# Patient Record
Sex: Female | Born: 1975 | Race: White | Hispanic: No | Smoking: Current every day smoker
Health system: Southern US, Community
[De-identification: ages and names within clinical notes are randomized; demographics above are authoritative.]

## PROBLEM LIST (undated history)

## (undated) ENCOUNTER — Inpatient Hospital Stay: Payer: Self-pay

## (undated) DIAGNOSIS — E119 Type 2 diabetes mellitus without complications: Secondary | ICD-10-CM

## (undated) DIAGNOSIS — E669 Obesity, unspecified: Secondary | ICD-10-CM

## (undated) DIAGNOSIS — I10 Essential (primary) hypertension: Secondary | ICD-10-CM

## (undated) HISTORY — PX: TOE AMPUTATION: SHX809

---

## 2005-09-10 ENCOUNTER — Emergency Department: Payer: Self-pay | Admitting: Emergency Medicine

## 2006-07-06 ENCOUNTER — Emergency Department: Payer: Self-pay | Admitting: Unknown Physician Specialty

## 2008-01-20 ENCOUNTER — Emergency Department (HOSPITAL_COMMUNITY): Admission: EM | Admit: 2008-01-20 | Discharge: 2008-01-20 | Payer: Self-pay | Admitting: Emergency Medicine

## 2008-04-25 ENCOUNTER — Emergency Department: Payer: Self-pay | Admitting: Emergency Medicine

## 2009-05-07 ENCOUNTER — Emergency Department: Payer: Self-pay | Admitting: Emergency Medicine

## 2011-02-15 ENCOUNTER — Emergency Department: Payer: Self-pay | Admitting: Emergency Medicine

## 2011-03-21 LAB — URINALYSIS, ROUTINE W REFLEX MICROSCOPIC
Glucose, UA: >1000 — AB
Ketones, ur: 15 — AB
Leukocytes, UA: NEGATIVE
Nitrite: NEGATIVE
Protein, ur: NEGATIVE
Urobilinogen, UA: 0.2

## 2011-03-21 LAB — DIFFERENTIAL
Eosinophils Absolute: 0.5
Eosinophils Relative: 4
Lymphocytes Relative: 33
Lymphs Abs: 3.6
Monocytes Absolute: 0.7
Monocytes Relative: 6

## 2011-03-21 LAB — CBC
MCHC: 34.2
MCV: 96.1
Platelets: 289
RBC: 4.29
WBC: 11 — ABNORMAL HIGH

## 2011-03-21 LAB — WET PREP, GENITAL
Trich, Wet Prep: NONE SEEN
Yeast Wet Prep HPF POC: NONE SEEN

## 2011-03-21 LAB — COMPREHENSIVE METABOLIC PANEL
ALT: 61 — ABNORMAL HIGH
AST: 32
Albumin: 3.8
Calcium: 9.1
Creatinine, Ser: 0.7
GFR calc Af Amer: >60
Sodium: 136

## 2011-03-21 LAB — URINE MICROSCOPIC-ADD ON

## 2012-10-31 ENCOUNTER — Emergency Department: Payer: Self-pay | Admitting: Emergency Medicine

## 2012-10-31 LAB — URINALYSIS, COMPLETE
Bacteria: NONE SEEN
Blood: NEGATIVE
Ketone: NEGATIVE
Leukocyte Esterase: NEGATIVE
Ph: 5 (ref 4.5–8.0)
Protein: NEGATIVE
Specific Gravity: 1.032 (ref 1.003–1.030)
WBC UR: <1 /HPF (ref 0–5)

## 2012-10-31 LAB — COMPREHENSIVE METABOLIC PANEL
Alkaline Phosphatase: 89 U/L (ref 50–136)
Creatinine: 0.51 mg/dL — ABNORMAL LOW (ref 0.60–1.30)
EGFR (African American): >60
Osmolality: 280 (ref 275–301)
Potassium: 3.8 mmol/L (ref 3.5–5.1)
SGOT(AST): 35 U/L (ref 15–37)
SGPT (ALT): 62 U/L (ref 12–78)
Sodium: 132 mmol/L — ABNORMAL LOW (ref 136–145)

## 2012-10-31 LAB — CBC
HGB: 15.6 g/dL (ref 12.0–16.0)
MCHC: 35.3 g/dL (ref 32.0–36.0)
Platelet: 304 10*3/uL (ref 150–440)
RDW: 13.6 % (ref 11.5–14.5)

## 2012-10-31 LAB — PREGNANCY, URINE: Pregnancy Test, Urine: NEGATIVE m[IU]/mL

## 2012-11-02 ENCOUNTER — Emergency Department: Payer: Self-pay | Admitting: Emergency Medicine

## 2012-11-06 LAB — CULTURE, BLOOD (SINGLE)

## 2013-03-16 ENCOUNTER — Emergency Department: Payer: Self-pay | Admitting: Emergency Medicine

## 2014-11-30 ENCOUNTER — Ambulatory Visit
Admission: RE | Admit: 2014-11-30 | Discharge: 2014-11-30 | Disposition: A | Discharge status: Home / Self Care | Payer: Medicaid Other | Source: Ambulatory Visit | Attending: Surgery | Admitting: Surgery

## 2014-11-30 ENCOUNTER — Encounter: Payer: Medicaid Other | Attending: Surgery | Admitting: Surgery

## 2014-11-30 ENCOUNTER — Other Ambulatory Visit: Payer: Self-pay | Admitting: Surgery

## 2014-11-30 DIAGNOSIS — L97522 Non-pressure chronic ulcer of other part of left foot with fat layer exposed: Secondary | ICD-10-CM | POA: Insufficient documentation

## 2014-11-30 DIAGNOSIS — T148 Other injury of unspecified body region: Secondary | ICD-10-CM | POA: Diagnosis not present

## 2014-11-30 DIAGNOSIS — I10 Essential (primary) hypertension: Secondary | ICD-10-CM | POA: Insufficient documentation

## 2014-11-30 DIAGNOSIS — E1140 Type 2 diabetes mellitus with diabetic neuropathy, unspecified: Secondary | ICD-10-CM | POA: Diagnosis not present

## 2014-11-30 DIAGNOSIS — E11621 Type 2 diabetes mellitus with foot ulcer: Secondary | ICD-10-CM | POA: Insufficient documentation

## 2014-11-30 DIAGNOSIS — T798XXA Other early complications of trauma, initial encounter: Secondary | ICD-10-CM

## 2014-11-30 DIAGNOSIS — F17210 Nicotine dependence, cigarettes, uncomplicated: Secondary | ICD-10-CM | POA: Diagnosis not present

## 2014-11-30 DIAGNOSIS — X58XXXA Exposure to other specified factors, initial encounter: Secondary | ICD-10-CM | POA: Diagnosis not present

## 2014-12-01 ENCOUNTER — Other Ambulatory Visit
Admission: RE | Admit: 2014-12-01 | Discharge: 2014-12-01 | Disposition: A | Discharge status: Home / Self Care | Payer: Medicaid Other | Source: Ambulatory Visit | Attending: Internal Medicine | Admitting: Internal Medicine

## 2014-12-01 ENCOUNTER — Ambulatory Visit: Payer: Self-pay | Admitting: Surgery

## 2014-12-01 DIAGNOSIS — L089 Local infection of the skin and subcutaneous tissue, unspecified: Secondary | ICD-10-CM | POA: Diagnosis present

## 2014-12-01 DIAGNOSIS — S91302A Unspecified open wound, left foot, initial encounter: Secondary | ICD-10-CM | POA: Insufficient documentation

## 2014-12-02 NOTE — Progress Notes (Addendum)
[name] ([id number]) Visit Report for 11/30/2014 Allergy List Details Patient Name: [name], [name] Date of Service: 11/30/2014 2:30 PM Medical Record Number: [id number] Patient Account Number: [id number] Date of Birth/Sex: 04-01-1976 (39 y.o. Female) Treating RN: Huel Coventry Primary Care Physician: [name] Other Clinician: Referring Physician: Treating Physician/Extender: [name] in Treatment: 0 Allergies Active Allergies No Known Allergies Allergy Notes Electronic Signature(s) Signed: 12/01/2014 4:53:24 PM By: Elliot Gurney, RN, BSN, Kim RN, BSN Entered By: Elliot Gurney, RN, BSN, Kim on 11/30/2014 14:30:42 Balderson, Rosezella [name] ([id number]) -------------------------------------------------------------------------------- Arrival Information Details Patient Name: [name] Date of Service: 11/30/2014 2:30 PM Medical Record Number: [id number] Patient Account Number: [id number] Date of Birth/Sex: [birth year] (39 y.o. Female) Treating RN: Huel Coventry Primary Care Physician: [name] Other Clinician: Referring Physician: Treating Physician/Extender: [name] in Treatment: 0 Visit Information Patient Arrived: Ambulatory Arrival Time: 14:24 Accompanied By: self Transfer Assistance: None Patient Identification Verified: Yes Secondary Verification Process Yes Completed: Patient Has Alerts: Yes Patient Alerts: Type II Diabetic 11/30/2014 ABI: (L) 1.24 (R) 0.92 Electronic Signature(s) Signed: 12/04/2014 10:03:05 AM By: Elliot Gurney, RN, BSN, Kim RN, BSN Previous Signature: 12/01/2014 4:53:24 PM Version By: Elliot Gurney, RN, BSN, Kim RN, BSN Entered By: Elliot Gurney, RN, BSN, Kim on 12/04/2014 10:03:05 Bordwell, Rosezella [name] ([id number]) -------------------------------------------------------------------------------- Clinic Level of Care Assessment Details Patient Name: [name], [name] Date of Service: 11/30/2014 2:30 PM Medical Record Number: [id number] Patient  Account Number: [id number] Date of Birth/Sex: December 26, 1975 (39 y.o. Female) Treating RN: Curtis Sites Primary Care Physician: [name] Other Clinician: Referring Physician: Treating Physician/Extender: [name] in Treatment: 0 Clinic Level of Care Assessment Items TOOL 2 Quantity Score []  - Use when only an EandM is performed on the INITIAL visit 0 ASSESSMENTS - Nursing Assessment / Reassessment X - General Physical Exam (combine w/ comprehensive assessment (listed just 1 20 below) when performed on new pt. evals) X - Comprehensive Assessment (HX, ROS, Risk Assessments, Wounds Hx, etc.) 1 25 ASSESSMENTS - Wound and Skin Assessment / Reassessment X - Simple Wound Assessment / Reassessment - one wound 1 5 []  - Complex Wound Assessment / Reassessment - multiple wounds 0 []  - Dermatologic / Skin Assessment (not related to wound area) 0 ASSESSMENTS - Ostomy and/or Continence Assessment and Care []  - Incontinence Assessment and Management 0 []  - Ostomy Care Assessment and Management (repouching, etc.) 0 PROCESS - Coordination of Care X - Simple Patient / Family Education for ongoing care 1 15 []  - Complex (extensive) Patient / Family Education for ongoing care 0 X - Staff obtains Chiropractor, Records, Test Results / Process Orders 1 10 []  - Staff telephones HHA, Nursing Homes / Clarify orders / etc 0 []  - Routine Transfer to another Facility (non-emergent condition) 0 []  - Routine Hospital Admission (non-emergent condition) 0 X - New Admissions / Manufacturing engineer / Ordering NPWT, Apligraf, etc. 1 15 []  - Emergency Hospital Admission (emergent condition) 0 X - Simple Discharge Coordination 1 10 [name], [name] ([id number]) []  - Complex (extensive) Discharge Coordination 0 PROCESS - Special Needs []  - Pediatric / Minor Patient Management 0 []  - Isolation Patient Management 0 []  - Hearing / Language / Visual special needs 0 []  - Assessment of Community assistance  (transportation, D/C planning, etc.) 0 []  - Additional assistance / Altered mentation 0 []  - Support Surface(s) Assessment (bed, cushion, seat, etc.) 0 INTERVENTIONS - Wound Cleansing / Measurement X - Wound Imaging (photographs - any number of wounds) 1 5 []  - Wound Tracing (instead  of photographs) 0 X - Simple Wound Measurement - one wound 1 5  - Complex Wound Measurement - multiple wounds 0 X - Simple Wound Cleansing - one wound 1 5  - Complex Wound Cleansing - multiple wounds 0 INTERVENTIONS - Wound Dressings X - Small Wound Dressing one or multiple wounds 1 10  - Medium Wound Dressing one or multiple wounds 0  - Large Wound Dressing one or multiple wounds 0  - Application of Medications - injection 0 INTERVENTIONS - Miscellaneous  - External ear exam 0  - Specimen Collection (cultures, biopsies, blood, body fluids, etc.) 0  - Specimen(s) / Culture(s) sent or taken to Lab for analysis 0  - Patient Transfer (multiple staff / Michiel Sites Lift / Similar devices) 0  - Simple Staple / Suture removal (25 or less) 0  - Complex Staple / Suture removal (26 or more) 0 [facility], Laurey M. ([id number])  - Hypo / Hyperglycemic Management (close monitor of Blood Glucose) 0 X - Ankle / Brachial Index (ABI) - do not check if billed separately 1 15 Has the patient been seen at the hospital within the last three years: Yes Total Score: 140 Level Of Care: New/Established - Level 4 Electronic Signature(s) Signed: 11/30/2014 5:03:06 PM By: Curtis Sites Entered By: Curtis Sites on 11/30/2014 15:25:59 Kealey, Rosezella [name] ([id number]) -------------------------------------------------------------------------------- Encounter Discharge Information Details Patient Name: [name], [name] Date of Service: 11/30/2014 2:30 PM Medical Record Number: [id number] Patient Account Number: [id number] Date of Birth/Sex: [birth year] (39 y.o. Female) Treating RN: Primary Care Physician: [name] Other Clinician: Referring Physician: Treating Physician/Extender: [name] in Treatment: 0 Encounter Discharge Information Items Discharge Pain Level: 0 Discharge Condition: Stable Ambulatory Status: Ambulatory Discharge Destination: Home Transportation: Private Auto Accompanied By: self Schedule Follow-up Appointment: Yes Medication Reconciliation completed and provided to Patient/Care Yes Huxley Vanwagoner: Provided on Clinical Summary of Care: 11/30/2014 Form Type Recipient Paper Patient LS Electronic Signature(s) Signed: 12/01/2014 4:53:24 PM By: Elliot Gurney RN, BSN, Kim RN, BSN Previous Signature: 11/30/2014 3:43:38 PM Version By: Gwenlyn Perking Entered By: Elliot Gurney RN, BSN, Kim on 11/30/2014 15:48:35 Rosamilia, Rosezella [name] ([id number]) -------------------------------------------------------------------------------- Lower Extremity Assessment Details Patient Name: Rueckert, Vergie M. Date of Service: 11/30/2014 2:30 PM Medical Record Number: [id number] Patient Account Number: [id number] Date of Birth/Sex: 03/28/1976 (39 y.o. Female) Treating RN: Huel Coventry Primary Care Physician: [name] Other Clinician: Referring Physician: Treating Physician/Extender: [name] in Treatment: 0 Edema Assessment Assessed: [Left: No] [Right: No] E[Left: dema] [Right: :] Calf Left: Right: Point of Measurement: 34 cm From Medial Instep 44.3 cm 45.2 cm Ankle Left: Right: Point of Measurement: 10 cm From Medial Instep 25.7 cm 25.3 cm Vascular Assessment Claudication: Claudication Assessment [Left:Intermittent] [Right:None] Pulses: Posterior Tibial Palpable: [Left:Yes] [Right:Yes] Doppler: [Left:Multiphasic] Dorsalis Pedis Palpable: [Left:Yes] [Right:Yes] Doppler: [Left:Multiphasic] [Right:Multiphasic] Extremity colors, hair growth, and conditions: Extremity Color: [Left:Normal] [Right:Normal] Hair Growth on Extremity: [Left:Yes] [Right:Yes] Temperature of Extremity:  [Left:Warm] [Right:Warm] Capillary Refill: [Left:< 3 seconds] [Right:< 3 seconds] Blood Pressure: Brachial: [Left:104] [Right:118] Dorsalis Pedis: 146 [Left:Dorsalis Pedis: 86] Ankle: Posterior Tibial: 138 [Left:Posterior Tibial: 108 1.24] [Right:0.92] Toe Nail Assessment Left: Right: Thick: Yes Yes Discolored: No Mifflin, Rosezella [name] ([id number]) Deformed: No No Improper Length and Hygiene: No No Electronic Signature(s) Signed: 12/01/2014 4:53:24 PM By: Elliot Gurney, RN, BSN, Kim RN, BSN Entered By: Elliot Gurney, RN, BSN, Kim on 11/30/2014 14:55:10 Hartzell, Rosezella [name] ([id number]) -------------------------------------------------------------------------------- Multi Wound Chart Details Patient Name: Huskins, Latiya M. Date of Service: 11/30/2014 2:30 PM Medical Record Number: [id number] Patient Account  Number: [id number] Date of Birth/Sex: 1976-01-04 (38 y.o. Female) Treating RN: Curtis Sites Primary Care Physician: [name] Other Clinician: Referring Physician: Treating Physician/Extender: [name] in Treatment: 0 Vital Signs Height(in): 67 Pulse(bpm): 84 Weight(lbs): 290 Blood Pressure 125/80 (mmHg): Body Mass Index(BMI): 45 Temperature(F): 97.7 Respiratory Rate 18 (breaths/min): Photos: [N/A:N/A] Wound Location: Left Foot - Plantar, Lateral N/A N/A Wounding Event: Gradually Appeared N/A N/A Primary Etiology: Diabetic Wound/Ulcer of N/A N/A the Lower Extremity Comorbid History: Type II Diabetes N/A N/A Date Acquired: 11/21/2013 N/A N/A Weeks of Treatment: 0 N/A N/A Wound Status: Open N/A N/A Measurements L x W x D 2.2x1.3x0.9 N/A N/A (cm) Area (cm) : 2.246 N/A N/A Volume (cm) : 2.022 N/A N/A % Reduction in Area: 0.00% N/A N/A % Reduction in Volume: 0.00% N/A N/A Classification: Grade 2 N/A N/A Exudate Amount: Large N/A N/A Exudate Type: Purulent N/A N/A Exudate Color: yellow, brown, green N/A N/A Foul Odor After Yes N/A N/A Cleansing: Odor  Anticipated Due to No N/A N/A Product Use: Wound Margin: Thickened N/A N/A Boden, Vonnetta M. ([id number]) Granulation Amount: Medium (34-66%) N/A N/A Necrotic Amount: Small (1-33%) N/A N/A Necrotic Tissue: Eschar N/A N/A Exposed Structures: Fascia: No N/A N/A Fat: No Tendon: No Muscle: No Joint: No Bone: No Limited to Skin Breakdown Epithelialization: None N/A N/A Periwound Skin Texture: Edema: No N/A N/A Excoriation: No Induration: No Callus: No Crepitus: No Fluctuance: No Friable: No Rash: No Scarring: No Periwound Skin Maceration: Yes N/A N/A Moisture: Moist: Yes Dry/Scaly: No Periwound Skin Color: Atrophie Blanche: No N/A N/A Cyanosis: No Ecchymosis: No Erythema: No Hemosiderin Staining: No Mottled: No Pallor: No Rubor: No Tenderness on No N/A N/A Palpation: Wound Preparation: Ulcer Cleansing: N/A N/A Rinsed/Irrigated with Saline Topical Anesthetic Applied: Other: lidocaine 4% Treatment Notes Electronic Signature(s) Signed: 11/30/2014 5:03:06 PM By: Curtis Sites Entered By: Curtis Sites on 11/30/2014 15:19:11 Dials, Rosezella [name] ([id number]) -------------------------------------------------------------------------------- Multi-Disciplinary Care Plan Details Patient Name: Leopard, Nikcole M. Date of Service: 11/30/2014 2:30 PM Medical Record Number: [id number] Patient Account Number: [id number] Date of Birth/Sex: 1975-12-27 (39 y.o. Female) Treating RN: Curtis Sites Primary Care Physician: [name] Other Clinician: Referring Physician: Treating Physician/Extender: [name] in Treatment: 0 Active Inactive Nutrition Nursing Diagnoses: Potential for alteratiion in Nutrition/Potential for imbalanced nutrition Goals: Patient/caregiver agrees to and verbalizes understanding of need to use nutritional supplements and/or vitamins as prescribed Date Initiated: 11/30/2014 Goal Status: Active Interventions: Assess patient nutrition  upon admission and as needed per policy Notes: Orientation to the Wound Care Program Nursing Diagnoses: Knowledge deficit related to the wound healing center program Goals: Patient/caregiver will verbalize understanding of the Wound Healing Center Program Date Initiated: 11/30/2014 Goal Status: Active Interventions: Provide education on orientation to the wound center Notes: Peripheral Neuropathy Nursing Diagnoses: Potential alteration in peripheral tissue perfusion (select prior to confirmation of diagnosis) Goals: Patient/caregiver will verbalize understanding of disease process and disease management Mayall, YASLYN CUMBY. ([id number]) Date Initiated: 11/30/2014 Goal Status: Active Interventions: Assess signs and symptoms of neuropathy upon admission and as needed Notes: Wound/Skin Impairment Nursing Diagnoses: Impaired tissue integrity Knowledge deficit related to smoking impact on wound healing Goals: Ulcer/skin breakdown will have a volume reduction of 30% by week 4 Date Initiated: 11/30/2014 Goal Status: Active Interventions: Assess ulceration(s) every visit Notes: Electronic Signature(s) Signed: 11/30/2014 5:03:06 PM By: Curtis Sites Entered By: Curtis Sites on 11/30/2014 15:18:35 Kiedrowski, Rosezella [name] ([id number]) -------------------------------------------------------------------------------- Pain Assessment Details Patient Name: Liaw, Lyndee M. Date of Service: 11/30/2014 2:30 PM Medical Record  Number: [id number] Patient Account Number: [id number] Date of Birth/Sex: 23-Sep-1975 (38 y.o. Female) Treating RN: Huel Coventry Primary Care Physician: [name] Other Clinician: Referring Physician: Treating Physician/Extender: [name] in Treatment: 0 Active Problems Location of Pain Severity and Description of Pain Patient Has Paino Yes Site Locations Pain Location: Pain in Ulcers With Dressing Change: No Duration of the Pain. Constant /  Intermittento Intermittent Character of Pain Describe the Pain: Shooting Pain Management and Medication Current Pain Management: Notes Elevation and gabapentin helps pain Electronic Signature(s) Signed: 12/01/2014 4:53:24 PM By: Elliot Gurney, RN, BSN, Kim RN, BSN Entered By: Elliot Gurney, RN, BSN, Kim on 11/30/2014 14:29:39 Nees, Rosezella [name] ([id number]) -------------------------------------------------------------------------------- Patient/Caregiver Education Details Patient Name: [name]. Date of Service: 11/30/2014 2:30 PM Medical Record Number: [id number] Patient Account Number: [id number] Date of Birth/Gender: 04-07-1976 (39 y.o. Female) Treating RN: Curtis Sites Primary Care Physician: [name] Other Clinician: Referring Physician: Treating Physician/Extender: [name] in Treatment: 0 Education Assessment Education Provided To: Patient Education Topics Provided Wound/Skin Impairment: Handouts: Other: wound care as ordered Methods: Demonstration, Explain/Verbal Responses: State content correctly Electronic Signature(s) Signed: 11/30/2014 5:03:06 PM By: Curtis Sites Entered By: Curtis Sites on 11/30/2014 15:24:17 Gupta, Rosezella [name] ([id number]) -------------------------------------------------------------------------------- Wound Assessment Details Patient Name: Mcquary, Keashia M. Date of Service: 11/30/2014 2:30 PM Medical Record Number: [id number] Patient Account Number: [id number] Date of Birth/Sex: October 14, 1975 (39 y.o. Female) Treating RN: Huel Coventry Primary Care Physician: [name] Other Clinician: Referring Physician: Treating Physician/Extender: [name] in Treatment: 0 Wound Status Wound Number: 1 Primary Diabetic Wound/Ulcer of the Lower Etiology: Extremity Wound Location: Left Foot - Plantar, Lateral Wound Status: Open Wounding Event: Gradually Appeared Comorbid Type II Diabetes Date Acquired:  11/21/2013 History: Weeks Of Treatment: 0 Clustered Wound: No Photos Wound Measurements Length: (cm) 2.2 Width: (cm) 1.3 Depth: (cm) 0.9 Area: (cm) 2.246 Volume: (cm) 2.022 % Reduction in Area: 0% % Reduction in Volume: 0% Epithelialization: None Tunneling: No Wound Description Classification: Grade 3 Wagner Verification: Abscess Wound Margin: Thickened Exudate Amount: Large Exudate Type: Purulent Exudate Color: yellow, brown, green Foul Odor After Cleansing: Yes Due to Product Use: No Wound Bed Granulation Amount: Medium (34-66%) Exposed Structure Necrotic Amount: Small (1-33%) Fascia Exposed: No Necrotic Quality: Eschar Fat Layer Exposed: No Tendon Exposed: No Furr, Cheray M. ([id number]) Muscle Exposed: No Joint Exposed: No Bone Exposed: No Limited to Skin Breakdown Periwound Skin Texture Texture Color No Abnormalities Noted: No No Abnormalities Noted: No Callus: No Atrophie Blanche: No Crepitus: No Cyanosis: No Excoriation: No Ecchymosis: No Fluctuance: No Erythema: No Friable: No Hemosiderin Staining: No Induration: No Mottled: No Localized Edema: No Pallor: No Rash: No Rubor: No Scarring: No Moisture No Abnormalities Noted: No Dry / Scaly: No Maceration: Yes Moist: Yes Wound Preparation Ulcer Cleansing: Rinsed/Irrigated with Saline Topical Anesthetic Applied: Other: lidocaine 4%, Treatment Notes Wound #1 (Left, Lateral, Plantar Foot) 1. Cleansed with: Clean wound with Normal Saline 2. Anesthetic Topical Lidocaine 4% cream to wound bed prior to debridement 4. Dressing Applied: Aquacel Ag 5. Secondary Dressing Applied Gauze and Kerlix/Conform Notes Felt around wound for off-loading. Electronic Signature(s) Signed: 12/04/2014 10:02:16 AM By: Elliot Gurney, RN, BSN, Kim RN, BSN Previous Signature: 12/01/2014 4:53:24 PM Version By: Elliot Gurney, RN, BSN, Kim RN, BSN Entered By: Elliot Gurney, RN, BSN, Kim on 12/04/2014 10:02:16 Mazur, Rosezella [name]  ([id number]) -------------------------------------------------------------------------------- Vitals Details Patient Name: Woodhams, AIDEE LATIMORE. Date of Service: 11/30/2014 2:30 PM Medical Record Number: [id number] Patient Account Number: [id number] Date of Birth/Sex: 29-Jul-1975 (39 y.o. Female)  Treating RN: Huel Coventry Primary Care Physician: [name] Other Clinician: Referring Physician: Treating Physician/Extender: [name] in Treatment: 0 Vital Signs Time Taken: 14:29 Temperature (F): 97.7 Height (in): 67 Pulse (bpm): 84 Source: Stated Respiratory Rate (breaths/min): 18 Weight (lbs): 290 Blood Pressure (mmHg): 125/80 Source: Measured Reference Range: 80 - 120 mg / dl Body Mass Index (BMI): 45.4 Electronic Signature(s) Signed: 12/01/2014 4:53:24 PM By: Elliot Gurney, RN, BSN, Kim RN, BSN Entered By: Elliot Gurney, RN, BSN, Kim on 11/30/2014 14:30:30

## 2014-12-02 NOTE — Progress Notes (Addendum)
[name] ([id number]) Visit Report for 11/30/2014 Chief Complaint Document Details Patient Name: [name], [name] Date of Service: 11/30/2014 2:30 PM Medical Record Number: [id number] Patient Account Number: [id number] Date of Birth/Sex: 06-Aug-1975 (39 y.o. Female) Treating RN: Primary Care Physician: [name] Other Clinician: Referring Physician: Treating Physician/Extender: [name] in Treatment: 0 Information Obtained from: Patient Chief Complaint Patients presents for treatment of an open diabetic ulcer. The patient has had an ulcerated wound on her left lower extremity for about 2 years. Electronic Signature(s) Signed: 11/30/2014 4:41:50 PM By: [name] MD, FACS Entered By: [name] on 11/30/2014 15:39:31 [name] -------------------------------------------------------------------------------- Debridement Details Patient Name: [name], [name] Date of Service: 11/30/2014 2:30 PM Medical Record Number: [id number] Patient Account Number: [id number] Date of Birth/Sex: [birth year] (39 y.o. Female) Treating RN: Primary Care Physician: [name] Other Clinician: Referring Physician: Treating Physician/Extender: [name] in Treatment: 0 Debridement Performed for Wound #1 Left,Lateral,Plantar Foot Assessment: Performed By: Physician Tristan Schroeder., MD Debridement: Debridement Pre-procedure Yes Verification/Time Out Taken: Start Time: 03:20 Pain Control: Lidocaine 4% Topical Solution Level: Skin/Subcutaneous Tissue/Muscle Total Area Debrided (L x 2.2 (cm) x 1.3 (cm) = 2.86 (cm) W): Tissue and other Viable, Non-Viable, Fibrin/Slough, Muscle, Skin, Subcutaneous material debrided: Instrument: Forceps, Scissors Bleeding: Moderate Hemostasis Achieved: Silver Nitrate End Time: 15:28 Procedural Pain: 0 Post Procedural Pain: 0 Response to Treatment: Procedure was tolerated well Post Debridement  Measurements of Total Wound Length: (cm) 3 Width: (cm) 2.1 Depth: (cm) 0.7 Volume: (cm) 3.464 Notes pieces of deep muscle tissue excised and sent for culture after washing out the wound nicely with saline. Electronic Signature(s) Signed: 11/30/2014 4:41:50 PM By: [name] MD, FACS Entered By: [name] on 11/30/2014 15:39:10 [name] -------------------------------------------------------------------------------- HPI Details Patient Name: [name], [name] Date of Service: 11/30/2014 2:30 PM Medical Record Number: [id number] Patient Account Number: [id number] Date of Birth/Sex: [birth year] (39 y.o. Female) Treating RN: Primary Care Physician: [name] Other Clinician: Referring Physician: Treating Physician/Extender: [name] in Treatment: 0 History of Present Illness Location: left foot ulcer Quality: Patient reports No Pain. Severity: Wound is moderately severed with some exposed structure in the wound bed. Duration: Patient has had the wound for > 2 years prior to seeking treatment at the wound center Timing: for the last 2 years she has been treated at the [facility] wound care center Context: The wound appeared gradually over time Modifying Factors: Other treatment(s) tried include:in the last month she has been operated on the left foot for an abscess of that foot and was in hospital for 2 weeks. Associated Signs and Symptoms: Periwound excoriated and wound draining heavily HPI Description: 39 year old patient who is known to be a diabetic comes from the office of Dr. Dario Guardian for a ulcer on the left lower extremity and foot. She has had an ulcer on the left foot and has had previous treatment at [facility]. the patient tells [place] that she has been seen as the outpatient center for a long while and has had various tests including MRIs done but she is not a very good historian. Most recently about a month ago she had a abscess drained from her  left foot and was in hospital for about 2 weeks and was then discharged with a PICC line and IV antibioticstics which she knows may be vancomycin and Levaquin. She has never had a wound VAC now has she had hyperbaric oxygen therapy. Past medical history significant for dyspnea, hypertension, hyperlipidemia,  neuropathy, diabetes type 2 uncontrolled. She smokes cigarettes the latest blood reports are from March 2016 and show that her BMP is essentially normal her lipid panel was also normal her hemoglobin A1c was 9.4. Electronic Signature(s) Signed: 11/30/2014 4:41:50 PM By: [name] MD, FACS Entered By: [name] on 11/30/2014 15:42:08 [name] ([id number]) -------------------------------------------------------------------------------- Physical Exam Details Patient Name: [name], [name] Date of Service: 11/30/2014 2:30 PM Medical Record Number: [id number] Patient Account Number: [id number] Date of Birth/Sex: 16-Sep-1975 (39 y.o. Female) Treating RN: Primary Care Physician: [name] Other Clinician: Referring Physician: Treating Physician/Extender: [name] in Treatment: 0 Constitutional . Pulse regular. Respirations normal and unlabored. Afebrile. . Eyes Nonicteric. Reactive to light. Ears, Nose, Mouth, and Throat Lips, teeth, and gums WNL.Marland Kitchen Moist mucosa without lesions . Neck supple and nontender. No palpable supraclavicular or cervical adenopathy. Normal sized without goiter. Respiratory WNL. No retractions.. Cardiovascular Pedal Pulses WNL. ABI on the right is 0.92 and the left is 1.24. No clubbing, cyanosis or edema. Gastrointestinal (GI) Abdomen without masses or tenderness.. No liver or spleen enlargement or tenderness.. Musculoskeletal Adexa without tenderness or enlargement.. Digits and nails w/o clubbing, cyanosis, infection, petechiae, ischemia, or inflammatory conditions.. Integumentary (Hair, Skin) The patient has a large  ulcerated open wound on the left plantar aspect more in the forefoot laterally. This has a large rind of callus and has foul-smelling discharge. She has been away from the wound care clinic for about 2 weeks.. No crepitus or fluctuance. No peri-wound warmth or erythema. No masses.Marland Kitchen Psychiatric Judgement and insight Intact.. No evidence of depression, anxiety, or agitation.. Electronic Signature(s) Signed: 11/30/2014 4:41:50 PM By: [name] MD, FACS Entered By: [name] on 11/30/2014 15:43:29 Wise, Rosezella [name] ([id number]) -------------------------------------------------------------------------------- Physician Orders Details Patient Name: [name], [name] Date of Service: 11/30/2014 2:30 PM Medical Record Number: [id number] Patient Account Number: [id number] Date of Birth/Sex: 11/02/1975 (39 y.o. Female) Treating RN: Curtis Sites Primary Care Physician: [name] Other Clinician: Referring Physician: Treating Physician/Extender: [name] in Treatment: 0 Verbal / Phone Orders: Yes Clinician: Curtis Sites Read Back and Verified: Yes Diagnosis Coding ICD-10 Coding Code Description E11.621 Type 2 diabetes mellitus with foot ulcer L97.512 Non-pressure chronic ulcer of other part of right foot with fat layer exposed E08.40 Diabetes mellitus due to underlying condition with diabetic neuropathy, unspecified Wound Cleansing Wound #1 Left,Lateral,Plantar Foot o Clean wound with Normal Saline. Anesthetic Wound #1 Left,Lateral,Plantar Foot o Topical Lidocaine 4% cream applied to wound bed prior to debridement Primary Wound Dressing Wound #1 Left,Lateral,Plantar Foot o Aquacel Ag Secondary Dressing Wound #1 Left,Lateral,Plantar Foot o Gauze and Kerlix/Conform Dressing Change Frequency Wound #1 Left,Lateral,Plantar Foot o Change dressing every other day. Follow-up Appointments Wound #1 Left,Lateral,Plantar Foot o Return Appointment in 1  week. Off-Loading Wound #1 Left,Lateral,Plantar Foot o Other: - darco front offloading shoe [name], [name] ([id number]) Additional Orders / Instructions Wound #1 Left,Lateral,Plantar Foot o Other: - keep blood sugars below 180 Laboratory o Culture and Sensitivity - left foot oooo Radiology o X-ray, foot - left foot oooo Electronic Signature(s) Signed: 11/30/2014 4:41:50 PM By: [name] MD, FACS Signed: 11/30/2014 5:03:06 PM By: Curtis Sites Entered By: Curtis Sites on 11/30/2014 15:33:14 Raine, Rosezella [name] ([id number]) -------------------------------------------------------------------------------- Problem List Details Patient Name: [name], [name] Date of Service: 11/30/2014 2:30 PM Medical Record Number: [id number] Patient Account Number: [id number] Date of Birth/Sex: 04/18/1976 (39 y.o. 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 Date of Service: 11/30/2014 2:30 PM Medical Record Number: [id number] Patient  Account Number: [id number] Date of Birth/Sex: October 11, 1975 (39 y.o. Female) Treating RN: Primary Care Physician: [name] Other Clinician: Referring Physician: Treating Physician/Extender: [name] in Treatment: 0 Subjective Chief Complaint Information obtained from Patient Patients presents for treatment of an open diabetic ulcer. The patient has had an ulcerated wound on her left lower extremity for about 2 years. History of Present Illness (HPI) The following HPI elements were documented for the patient's wound: Location: left foot ulcer Quality: Patient reports No Pain. Severity: Wound is moderately severed with some exposed structure in the wound bed. Duration: Patient has had the wound for > 2 years prior to seeking treatment at the wound center Timing: for the last 2 years she has been treated at the [facility] wound care center Context: The wound appeared gradually over time Modifying Factors: Other treatment(s) tried include:in the last month she has been operated on the left foot for an abscess of that foot and was in hospital for 2 weeks. Associated Signs and Symptoms: Periwound excoriated and wound draining heavily 39 year old patient who is known to be a diabetic comes from the office of Dr. Dario Guardian for a ulcer on the left lower extremity and foot. She has had an ulcer on the left foot and has had previous treatment at [facility]. the patient tells [place] that she has been seen as the outpatient center for a long while and has had various tests including MRIs done but she is not a very good historian. Most recently about a month ago she had a abscess drained from her left foot and was in hospital for about 2 weeks and was then discharged with a PICC line and IV antibioticstics which she knows may be vancomycin and Levaquin. She has never had a wound VAC now has she had hyperbaric oxygen therapy. Past medical history significant for dyspnea, hypertension, hyperlipidemia,  neuropathy, diabetes type 2 uncontrolled. She smokes cigarettes the latest blood reports are from March 2016 and show that her BMP is essentially normal her lipid panel was also normal her hemoglobin A1c was 9.4. Wound History Patient presents with 1 open wound that has been present for approximately year. Patient has been treating wound in the following manner: hydrofera blue. The wound has been healed in the past but has re-opened. Laboratory tests have been performed in the last month. Patient reportedly has tested positive for an antibiotic resistant organism. Patient reportedly has not tested positive for osteomyelitis. Patient reportedly has had testing performed to evaluate circulation in the legs. Patient experiences the following problems [name], [name] ([id number]) associated with their wounds: infection, swelling. Patient History Information obtained from Patient. Allergies No Known Allergies Family History Diabetes - Mother, Father, Heart Disease - Mother, Hypertension - Mother, No family history of Cancer, Kidney Disease, Lung Disease, Seizures, Stroke, Thyroid Problems. Social  History Current every day smoker, Marital Status - Separated, Alcohol Use - Never, Drug Use - Prior History, Caffeine Use - Rarely. Medical History Endocrine Patient has history of Type II Diabetes Patient is treated with Insulin. Blood sugar is tested. Blood sugar results noted at the following times: Lunch - 256. Hospitalization/Surgery History - 11/08/2014, [facility], [facility]. Medical And Surgical History Notes Constitutional Symptoms (General Health) Type II Diabetes, High Blood Pressure, pancreatitis; neuropathy Review of Systems (ROS) Constitutional Symptoms (General Health) The patient has no complaints or symptoms. Eyes The patient has no complaints or symptoms. Ear/Nose/Mouth/Throat Complains or has symptoms of Sinusitis - seasonal. Hematologic/Lymphatic The patient has no complaints or  symptoms. Respiratory The patient has no complaints or symptoms. Cardiovascular Complains or has symptoms of LE edema. Gastrointestinal The patient has no complaints or symptoms. Genitourinary The patient has no complaints or symptoms. Immunological The patient has no complaints or symptoms. Integumentary (Skin) Complains or has symptoms of Wounds. Dorning, Rosezella [name] ([id number]) Denies complaints or symptoms of Bleeding or bruising tendency. Neurologic Complains or has symptoms of Numbness/parasthesias - neuropathy. Oncologic The patient has no complaints or symptoms. Objective Constitutional Pulse regular. Respirations normal and unlabored. Afebrile. Vitals Time Taken: 2:29 PM, Height: 67 in, Source: Stated, Weight: 290 lbs, Source: Measured, BMI: 45.4, Temperature: 97.7 F, Pulse: 84 bpm, Respiratory Rate: 18 breaths/min, Blood Pressure: 125/80 mmHg. Eyes Nonicteric. Reactive to light. Ears, Nose, Mouth, and Throat Lips, teeth, and gums WNL.Marland Kitchen Moist mucosa without lesions . Neck supple and nontender. No palpable supraclavicular or cervical adenopathy. Normal sized without goiter. Respiratory WNL. No retractions.. Cardiovascular Pedal Pulses WNL. ABI on the right is 0.92 and the left is 1.24. No clubbing, cyanosis or edema. Gastrointestinal (GI) Abdomen without masses or tenderness.. No liver or spleen enlargement or tenderness.. Musculoskeletal Adexa without tenderness or enlargement.. Digits and nails w/o clubbing, cyanosis, infection, petechiae, ischemia, or inflammatory conditions.Marland Kitchen Psychiatric Judgement and insight Intact.. No evidence of depression, anxiety, or agitation.. Integumentary (Hair, Skin) The patient has a large ulcerated open wound on the left plantar aspect more in the forefoot laterally. This has a large rind of callus and has foul-smelling discharge. She has been away from the wound care clinic [name], [name] HAMELIN. ([id number]) for about 2 weeks..  No crepitus or fluctuance. No peri-wound warmth or erythema. No masses.. Wound #1 status is Open. Original cause of wound was Gradually Appeared. The wound is located on the Left,Lateral,Plantar Foot. The wound measures 2.2cm length x 1.3cm width x 0.9cm depth; 2.246cm^2 area and 2.022cm^3 volume. The wound is limited to skin breakdown. There is no tunneling noted. There is a large amount of purulent drainage noted. The wound margin is thickened. There is medium (34-66%) granulation within the wound bed. There is a small (1-33%) amount of necrotic tissue within the wound bed including Eschar. The periwound skin appearance exhibited: Maceration, Moist. The periwound skin appearance did not exhibit: Callus, Crepitus, Excoriation, Fluctuance, Friable, Induration, Localized Edema, Rash, Scarring, Dry/Scaly, Atrophie Blanche, Cyanosis, Ecchymosis, Hemosiderin Staining, Mottled, Pallor, Rubor, Erythema. The patient has a large ulcerated open wound on the left plantar aspect more in the forefoot laterally. This has a large rind of callus and has foul-smelling discharge. She has been away from the wound care clinic for about 2 weeks. 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 Most recently she has had surgery for an abscess deep-seated in her foot and by her description has the possibility of having bone involvement. She has a Wagner grade 3 ulceration. After debriding it extensively I have also taken some tissue for  culture and we will start off with a plain x-ray of her foot. An MRI may be called for if it has not been done recently at [facility]. I have asked her to completely give up smoking and have spent some time counseling her regarding this. We will dress the wound with silver alginate packing, and to offload the wound, a felt piece.She will continue to wear a Darco shoe. we have also discussed the possibility of using IV antibodies again if this significant deep infection of her soft tissue and bone and also have discussed in brief the use of hyperbaric oxygen therapy. Will be given Burtis, Vashon M. ([id number]) more detailed instructions and options once we have all her tests back. All questions answered and she says she will be compliant. Procedures Wound #1 Wound #1 is a Diabetic Wound/Ulcer of the Lower Extremity located on the Left,Lateral,Plantar Foot . There was a Skin/Subcutaneous Tissue/Muscle Debridement ([id number]) debridement with total area of 2.86 sq cm performed by Juelle Dickmann, Ignacia Felling., MD. with the following instrument(s): Forceps and Scissors to remove Viable and Non-Viable tissue/material including Fibrin/Slough, Muscle, Skin, and Subcutaneous after achieving pain control using Lidocaine 4% Topical Solution. A time out was conducted prior to the start of the procedure. A Moderate amount of bleeding was controlled with Silver Nitrate. The procedure was tolerated well with a pain level of 0 throughout and a pain level of 0 following the procedure. Post Debridement Measurements: 3cm length x 2.1cm width x 0.7cm depth; 3.464cm^3 volume. General Notes: pieces of deep muscle tissue excised and sent for culture after washing out the wound nicely with saline.. Plan Wound Cleansing: Wound #1 Left,Lateral,Plantar Foot: Clean wound with Normal Saline. Anesthetic: Wound #1 Left,Lateral,Plantar Foot: Topical Lidocaine 4% cream applied to wound bed prior to debridement Primary Wound  Dressing: Wound #1 Left,Lateral,Plantar Foot: Aquacel Ag Secondary Dressing: Wound #1 Left,Lateral,Plantar Foot: Gauze and Kerlix/Conform Dressing Change Frequency: Wound #1 Left,Lateral,Plantar Foot: Change dressing every other day. Follow-up Appointments: Wound #1 Left,Lateral,Plantar Foot: Return Appointment in 1 week. Off-Loading: Wound #1 Left,Lateral,Plantar Foot: Other: - darco front offloading shoe Additional Orders / Instructions: Wound #1 Left,Lateral,Plantar Foot: Other: - keep blood sugars below 180 Laboratory ordered were: Faison, Rosezella [name] ([id number]) Culture and Sensitivity - left foot Radiology ordered were: X-ray, foot - left foot This young lady has had diabetes mellitus for several years since 2009, and has been treated at [facility] for a DF you for over 2 years. Most recently she has had surgery for an abscess deep-seated in her foot and by her description has the possibility of having bone involvement. She has a Wagner grade 3 ulceration. After debriding it extensively I have also taken some tissue for culture and we will start off with a plain x-ray of her foot. An MRI may be called for if it has not been done recently at [facility]. I have asked her to completely give up smoking and have spent some time counseling her regarding this. We will dress the wound  with silver alginate packing, and to offload the wound, a felt piece.She will continue to wear a Darco shoe. we have also discussed the possibility of using IV antibodies again if this significant deep infection of her soft tissue and bone and also have discussed in brief the use of hyperbaric oxygen therapy. Will be given more detailed instructions and options once we have all her tests back. All questions answered and she says she will be compliant. I have spoken to her primary care Dr. Dario Guardian and have discussed the case in great detail with him and he would be happy for [place] to continue aggressively treating her  and he will support her medical care Electronic Signature(s) Signed: 12/04/2014 4:43:13 PM By: [name] MD, FACS Previous Signature: 11/30/2014 4:41:50 PM Version By: [name] MD, FACS Entered By: [name] on 12/04/2014 16:40:29 Pinales, Rosezella [name] ([id number]) -------------------------------------------------------------------------------- ROS/PFSH Details Patient Name: Plain, Akyah M. Date of Service: 11/30/2014 2:30 PM Medical Record Number: [id number] Patient Account Number: [id number] Date of Birth/Sex: Jun 28, 1975 (39 y.o. Female) Treating RN: Huel Coventry Primary Care Physician: [name] Other Clinician: Referring Physician: Treating Physician/Extender: [name] in Treatment: 0 Information Obtained From Patient Wound History Do you currently have one or more open woundso Yes How many open wounds do you currently haveo 1 Approximately how long have you had your woundso year How have you been treating your wound(s) until nowo hydrofera blue Has your wound(s) ever healed and then re-openedo Yes Have you had any lab work done in the past montho Yes Who ordered the lab work doneo [facility] Have you tested positive for an antibiotic resistant organism (MRSA, VRE)o Yes Date: 05/23/2014 Have you tested positive for osteomyelitis (bone infection)o No Have you had any tests for circulation on your legso Yes Who ordered the testo [facility] Have you had other problems associated with your woundso Infection, Swelling Ear/Nose/Mouth/Throat Complaints and Symptoms: Positive for: Sinusitis - seasonal Cardiovascular Complaints and Symptoms: Positive for: LE edema Integumentary (Skin) Complaints and Symptoms: Positive for: Wounds Negative for: Bleeding or bruising tendency Neurologic Complaints and Symptoms: Positive for: Numbness/parasthesias - neuropathy Constitutional Symptoms (General Health) Complaints and Symptoms: No Complaints or Symptoms [name], [name]  M. ([id number]) Medical History: Past Medical History Notes: Type II Diabetes, High Blood Pressure, pancreatitis; neuropathy Eyes Complaints and Symptoms: No Complaints or Symptoms Hematologic/Lymphatic Complaints and Symptoms: No Complaints or Symptoms Respiratory Complaints and Symptoms: No Complaints or Symptoms Gastrointestinal Complaints and Symptoms: No Complaints or Symptoms Endocrine Medical History: Positive for: Type II Diabetes Time with diabetes: 2009 Treated with: Insulin Blood sugar tested every day: Yes Tested : 4 times Blood sugar testing results: Lunch: 256 Genitourinary Complaints and Symptoms: No Complaints or Symptoms Immunological Complaints and Symptoms: No Complaints or Symptoms Oncologic Complaints and Symptoms: No Complaints or Symptoms Hospitalization / Surgery History [name], [name] ABRAM. ([id number]) Name of Hospital Purpose of Hospitalization/Surgery Date [facility] 11/08/2014 Family and Social History Cancer: No; Diabetes: Yes - Mother, Father; Heart Disease: Yes - Mother; Hypertension: Yes - Mother; Kidney Disease: No; Lung Disease: No; Seizures: No; Stroke: No; Thyroid Problems: No; Current every day smoker; Marital Status - Separated; Alcohol Use: Never; Drug Use: Prior History; Caffeine Use: Rarely; Living Will: No Physician Affirmation I have reviewed and agree with the above information. Electronic Signature(s) Signed: 11/30/2014 4:41:50 PM By: [name] MD, FACS Signed: 12/01/2014 4:53:24 PM By: Elliot Gurney RN, BSN, Kim RN, BSN Entered By: [name] on 11/30/2014 14:47:09 Brundidge, Rosezella [name] ([id number]) -------------------------------------------------------------------------------- SuperBill Details Patient  Name: Claiborne, [name] Date of Service: 11/30/2014 Medical Record Number: [id number] Patient Account Number: [id number] Date of Birth/Sex: 1976/06/06 (39 y.o. Female) Treating RN: Primary Care Physician: [name] Other Clinician: Referring Physician: Treating Physician/Extender: [name] in Treatment: 0 Diagnosis Coding ICD-10 Codes Code Description E11.621 Type 2 diabetes mellitus with foot ulcer E08.40 Diabetes mellitus due to underlying condition with diabetic neuropathy, unspecified L97.513 Non-pressure chronic ulcer of other part of right foot with necrosis of muscle F17.218 Nicotine dependence, cigarettes, with other nicotine-induced disorders E66.01 Morbid (severe) obesity due to excess calories Facility Procedures CPT4: Description Modifier Quantity Code [id number] - WOUND CARE VISIT-LEV 4 EST PT 1 CPT4: 64680321 11043 - DEB MUSC/FASCIA 20 SQ CM/< 1 ICD-10 Description Diagnosis E11.621 Type 2 diabetes mellitus with foot ulcer E08.40 Diabetes mellitus due to underlying condition with diabetic neuropathy, unspecified L97.513 Non-pressure chronic  ulcer of other part of right foot with necrosis of muscle F17.218 Nicotine dependence, cigarettes, with other nicotine-induced disorders CPT4: 22482500 99406-SMOKING CESSATION 3-10MINS 1 ICD-10 Description Diagnosis E11.621 Type 2 diabetes mellitus with foot ulcer F17.218 Nicotine dependence, cigarettes, with other nicotine-induced disorders Physician Procedures CPT4: Description Modifier Quantity Code [id number] - WC PHYS LEVEL 4 - EST PT 1 ICD-10 Description Diagnosis Overbaugh, Rosezella [name] ([id number]) Electronic Signature(s) Signed: 11/30/2014 4:41:50 PM By: [name] MD, FACS Entered By: [name] on 11/30/2014 15:49:46

## 2014-12-02 NOTE — Progress Notes (Signed)
[name], [name] ([id number]) Visit Report for 11/30/2014 Abuse/Suicide Risk Screen Details Patient Name: [name], [name] Date of Service: 11/30/2014 2:30 PM Medical Record Number: [id number] Patient Account Number: [id number] Date of Birth/Sex: 1975-07-21 (40 y.o. Female) Treating RN: Huel Coventry Primary Care Physician: [name] Other Clinician: Referring Physician: Treating Physician/Extender: [name] in Treatment: 0 Abuse/Suicide Risk Screen Items Answer ABUSE/SUICIDE RISK SCREEN: Has anyone close to you tried to hurt or harm you recentlyo No Do you feel uncomfortable with anyone in your familyo No Has anyone forced you do things that you didnot want to doo No Do you have any thoughts of harming yourselfo No Patient displays signs or symptoms of abuse and/or neglect. No Electronic Signature(s) Signed: 12/01/2014 4:53:24 PM By: Elliot Gurney, RN, BSN, Kim RN, BSN Entered By: Elliot Gurney, RN, BSN, Kim on 11/30/2014 14:38:16 [name], [name] ([id number]) -------------------------------------------------------------------------------- Activities of Daily Living Details Patient Name: [name], [name] Date of Service: 11/30/2014 2:30 PM Medical Record Number: [id number] Patient Account Number: [id number] Date of Birth/Sex: 05-05-1976 (39 y.o. Female) Treating RN: Huel Coventry Primary Care Physician: [name] Other Clinician: Referring Physician: Treating Physician/Extender: [name] in Treatment: 0 Activities of Daily Living Items Answer Activities of Daily Living (Please select one for each item) Drive Automobile Completely Able Take Medications Completely Able Use Telephone Completely Able Care for Appearance Completely Able Use Toilet Completely Able Bath / Shower Completely Able Dress Self Completely Able Feed Self Completely Able Walk Completely Able Get In / Out Bed Completely Able Housework Completely Able Prepare Meals Completely  Able Handle Money Completely Able Shop for Self Completely Able Electronic Signature(s) Signed: 12/01/2014 4:53:24 PM By: Elliot Gurney, RN, BSN, Kim RN, BSN Entered By: Elliot Gurney, RN, BSN, Kim on 11/30/2014 14:38:30 [name], [name] ([id number]) -------------------------------------------------------------------------------- Education Assessment Details Patient Name: [name], [name] Date of Service: 11/30/2014 2:30 PM Medical Record Number: [id number] Patient Account Number: [id number] Date of Birth/Sex: July 30, 1975 (39 y.o. Female) Treating RN: Huel Coventry Primary Care Physician: [name] Other Clinician: Referring Physician: Treating Physician/Extender: [name] in Treatment: 0 Primary Learner Assessed: Patient Learning Preferences/Education Level/Primary Language Highest Education Level: High School Preferred Language: English Cognitive Barrier Assessment/Beliefs Language Barrier: No Translator Needed: No Memory Deficit: No Emotional Barrier: No Cultural/Religious Beliefs Affecting Medical No Care: Physical Barrier Assessment Impaired Vision: No Impaired Hearing: No Decreased Hand dexterity: No Knowledge/Comprehension Assessment Knowledge Level: High Comprehension Level: High Ability to understand written High instructions: Ability to understand verbal High instructions: Motivation Assessment Anxiety Level: Calm Cooperation: Cooperative Education Importance: Acknowledges Need Interest in Health Problems: Asks Questions Perception: Coherent Willingness to Engage in Self- High Management Activities: Readiness to Engage in Self- High Management Activities: Electronic Signature(s) Signed: 12/01/2014 4:53:24 PM By: Elliot Gurney, RN, BSN, Kim RN, BSN [name], [name] Range. ([id number]) Entered By: Elliot Gurney, RN, BSN, Kim on 11/30/2014 14:39:08 [name], [name] ([id number]) -------------------------------------------------------------------------------- Fall Risk  Assessment Details Patient Name: [name], [name] Date of Service: 11/30/2014 2:30 PM Medical Record Number: [id number] Patient Account Number: [id number] Date of Birth/Sex: 02-Feb-1976 (39 y.o. Female) Treating RN: Huel Coventry Primary Care Physician: [name] Other Clinician: Referring Physician: Treating Physician/Extender: [name] in Treatment: 0 Fall Risk Assessment Items FALL RISK ASSESSMENT: History of falling - immediate or within 3 months 0 No Secondary diagnosis 0 No Ambulatory aid None/bed rest/wheelchair/nurse 0 Yes Crutches/cane/walker 0 No Furniture 0 No IV Access/Saline Lock 0 No Gait/Training Normal/bed rest/immobile 0 Yes Weak 0 No Impaired 0 No Mental Status Oriented to own ability 0  Yes Electronic Signature(s) Signed: 12/01/2014 4:53:24 PM By: Elliot Gurney, RN, BSN, Kim RN, BSN Entered By: Elliot Gurney, RN, BSN, Kim on 11/30/2014 14:39:23 [name], [name] ([id number]) -------------------------------------------------------------------------------- Foot Assessment Details Patient Name: [name], [name] Date of Service: 11/30/2014 2:30 PM Medical Record Number: [id number] Patient Account Number: [id number] Date of Birth/Sex: Dec 08, 1975 (39 y.o. Female) Treating RN: Huel Coventry Primary Care Physician: [name] Other Clinician: Referring Physician: Treating Physician/Extender: [name] in Treatment: 0 Foot Assessment Items Site Locations + = Sensation present, - = Sensation absent, C = Callus, U = Ulcer R = Redness, W = Warmth, M = Maceration, PU = Pre-ulcerative lesion F = Fissure, S = Swelling, D = Dryness Assessment Right: Left: Other Deformity: No No Prior Foot Ulcer: No No Prior Amputation: No No Charcot Joint: No No Ambulatory Status: Ambulatory Without Help Gait: Steady Electronic Signature(s) Signed: 12/01/2014 4:53:24 PM By: Elliot Gurney, RN, BSN, Kim RN, BSN Entered By: Elliot Gurney, RN, BSN, Kim on 11/30/2014  14:41:43 [name], [name] ([id number]) -------------------------------------------------------------------------------- Nutrition Risk Assessment Details Patient Name: [name], [name] Date of Service: 11/30/2014 2:30 PM Medical Record Number: [id number] Patient Account Number: [id number] Date of Birth/Sex: 02/14/1976 (39 y.o. Female) Treating RN: Huel Coventry Primary Care Physician: [name] Other Clinician: Referring Physician: Treating Physician/Extender: [name] in Treatment: 0 Height (in): 67 Weight (lbs): 290 Body Mass Index (BMI): 45.4 Nutrition Risk Assessment Items NUTRITION RISK SCREEN: I have an illness or condition that made me change the kind and/or 2 Yes amount of food I eat I eat fewer than two meals per day 3 Yes I eat few fruits and vegetables, or milk products 0 No I have three or more drinks of beer, liquor or wine almost every day 0 No I have tooth or mouth problems that make it hard for me to eat 0 No I don't always have enough money to buy the food I need 0 No I eat alone most of the time 0 No I take three or more different prescribed or over-the-counter drugs a 0 No day Without wanting to, I have lost or gained 10 pounds in the last six 0 No months I am not always physically able to shop, cook and/or feed myself 0 No Nutrition Protocols Good Risk Protocol Provide education on elevated blood sugars Moderate Risk Protocol 0 and impact on wound healing, as applicable Electronic Signature(s) Signed: 12/01/2014 4:53:24 PM By: Elliot Gurney, RN, BSN, Kim RN, BSN Entered By: Elliot Gurney, RN, BSN, Kim on 11/30/2014 14:40:07

## 2014-12-04 LAB — WOUND CULTURE

## 2014-12-05 NOTE — Progress Notes (Addendum)
CHERELLE, RENNER ([id number]) Visit Report for 12/07/2014 Chief Complaint Document Details Patient Name: ALANDRA, BARA 12/07/2014 11:30 Date of Service: AM Medical Record [id number] Number: Patient Account Number: [id number] [birth year] (39 y.o. Treating RN: Date of Birth/Sex: Female) Other Clinician: Primary Care Physician: [name] Treating Lexys Milliner Referring Physician: [name] Physician/Extender: [name] in Treatment: 1 Information Obtained from: Patient Chief Complaint Patients presents for treatment of an open diabetic ulcer. The patient has had an ulcerated wound on her left lower extremity for about 2 years. Electronic Signature(s) Signed: 12/07/2014 1:17:45 PM By: [name] MD, FACS Entered By: [name] on 12/07/2014 12:20:59 Macomber, [name] ([id number]) -------------------------------------------------------------------------------- Debridement Details Patient Name: BISAN, BLIGHT. 12/07/2014 11:30 Date of Service: AM Medical Record [id number] Number: Patient Account Number: [id number] [birth year] (39 y.o. Treating RN: Date of Birth/Sex: Female) Other Clinician: Primary Care Physician: [name] Treating Shunsuke Granzow Referring Physician: [name] Physician/Extender: [name] in Treatment: 1 Debridement Performed for Wound #1 Left,Lateral,Plantar Foot Assessment: Performed By: Physician Tristan Schroeder., MD Debridement: Debridement Pre-procedure Yes Verification/Time Out Taken: Start Time: 12:13 Pain Control: Lidocaine 4% Topical Solution Level: Skin/Subcutaneous Tissue Total Area Debrided (L x 1.9 (cm) x 1.3 (cm) = 2.47 (cm) W): Tissue and other Viable, Non-Viable, Callus, Eschar, Exudate, Fibrin/Slough, Skin, material debrided: Subcutaneous Instrument: Curette Bleeding: Moderate Hemostasis Achieved: Silver Nitrate End Time: 12:18 Procedural Pain: 0 Post Procedural Pain: 0 Response to Treatment:  Procedure was tolerated well Post Debridement Measurements of Total Wound Length: (cm) 1.9 Width: (cm) 1.3 Depth: (cm) 0.3 Volume: (cm) 0.582 Electronic Signature(s) Signed: 12/07/2014 1:17:45 PM By: [name] MD, FACS Entered By: [name] on 12/07/2014 12:20:51 Langland, [name] ([id number]) -------------------------------------------------------------------------------- HPI Details Patient Name: CATALYNA, HALLECK. 12/07/2014 11:30 Date of Service: AM Medical Record [id number] Number: Patient Account Number: [id number] 1976/06/14 (39 y.o. Treating RN: Date of Birth/Sex: Female) Other Clinician: Primary Care Physician: [name] Treating Jamarl Pew Referring Physician: [name] Physician/Extender: Weeks in Treatment: 1 History of Present Illness Location: left foot ulcer Quality: Patient reports No Pain. Severity: Wound is moderately severed with some exposed structure in the wound bed. Duration: Patient has had the wound for > 2 years prior to seeking treatment at the wound center Timing: for the last 2 years she has been treated at the [facility] wound care center Context: The wound appeared gradually over time Modifying Factors: Other treatment(s) tried include:in the last month she has been operated on the left foot for an abscess of that foot and was in hospital for 2 weeks. Associated Signs and Symptoms: Periwound excoriated and wound draining heavily HPI Description: 39 year old patient who is known to be a diabetic comes from the office of Dr. Dario Guardian for a ulcer on the left lower extremity and foot. She has had an ulcer on the left foot and has had previous treatment at [facility]. the patient tells [place] that she has been seen as the outpatient center for a long while and has had various tests including MRIs done but she is not a very good historian. Most recently about a month ago she had a abscess drained from her left foot and was in hospital for about 2  weeks and was then discharged with a PICC line and IV antibioticstics which she knows may be vancomycin and Levaquin. She has never had a wound VAC now has she had hyperbaric oxygen therapy. Past medical history significant for dyspnea, hypertension, hyperlipidemia, neuropathy, diabetes type 2 uncontrolled. She smokes cigarettes the latest blood reports  are from March 2016 and show that her BMP is essentially normal her lipid panel was also normal her hemoglobin A1c was 9.4. 12/07/2014 -- Her tissue culture is back and she has grown significant amounts of Proteus mirabilis, pseudomonas aeruginosa and Staphylococcus aureus. The staph is a MSSA and all the cultures are sensitive to ciprofloxacin. I will start her on Cipro 500 mg by mouth twice a day for 14 days. Xray left foot -- IMPRESSION:No evidence of osteomyelitis. Healed fracture of the distal left third metatarsal. Electronic Signature(s) Signed: 12/07/2014 1:17:45 PM By: [name] MD, FACS Entered By: [name] on 12/07/2014 12:21:43 Kohler, [name] ([id number]) -------------------------------------------------------------------------------- Physical Exam Details Patient Name: ALLYCE, BOCHICCHIO. 12/07/2014 11:30 Date of Service: AM Medical Record [id number] Number: Patient Account Number: [id number] Jul 22, 1975 (39 y.o. Treating RN: Date of Birth/Sex: Female) Other Clinician: Primary Care Physician: [name] Treating Chealsea Paske Referring Physician: [name] Physician/Extender: Weeks in Treatment: 1 Constitutional . Pulse regular. Respirations normal and unlabored. Afebrile. . Eyes Nonicteric. Reactive to light. Ears, Nose, Mouth, and Throat Lips, teeth, and gums WNL.Marland Kitchen Moist mucosa without lesions . Neck supple and nontender. No palpable supraclavicular or cervical adenopathy. Normal sized without goiter. Respiratory WNL. No retractions.. Cardiovascular Pedal Pulses WNL. No clubbing, cyanosis  or edema. Integumentary (Hair, Skin) the wound continues to have some maceration and some callus and there is some debris at the edges of the wound. This will be sharply curetted.. No crepitus or fluctuance. No peri-wound warmth or erythema. No masses.Marland Kitchen Psychiatric Judgement and insight Intact.. No evidence of depression, anxiety, or agitation.. Electronic Signature(s) Signed: 12/07/2014 1:17:45 PM By: [name] MD, FACS Entered By: [name] on 12/07/2014 12:22:28 Haas, [name] ([id number]) -------------------------------------------------------------------------------- Physician Orders Details Patient Name: DELENN, AHN. 12/04/2014 11:30 Date of Service: AM Medical Record [id number] Number: Patient Account Number: [id number] 1975/08/23 (39 y.o. Treating RN: Curtis Sites Date of Birth/Sex: Female) Other Clinician: Primary Care Physician: [name] Treating [name] Referring Physician: [name] Physician/Extender: [name] in Treatment: 1 Verbal / Phone Orders: Yes Clinician: Curtis Sites Read Back and Verified: Yes Diagnosis Coding Wound Cleansing Wound #1 Left,Lateral,Plantar Foot o Clean wound with Normal Saline. Anesthetic Wound #1 Left,Lateral,Plantar Foot o Topical Lidocaine 4% cream applied to wound bed prior to debridement Primary Wound Dressing Wound #1 Left,Lateral,Plantar Foot o Aquacel Ag o Other: - felt Secondary Dressing Wound #1 Left,Lateral,Plantar Foot o Gauze and Kerlix/Conform Dressing Change Frequency Wound #1 Left,Lateral,Plantar Foot o Change dressing every other day. Follow-up Appointments Wound #1 Left,Lateral,Plantar Foot o Return Appointment in 1 week. Off-Loading Wound #1 Left,Lateral,Plantar Foot o Other: - darco front offloading shoe Hyperbaric Oxygen Therapy Wound #1 Left,Lateral,Plantar Foot o Evaluate for HBO Therapy o Indication: - wagner grade 3 Vahle, Bevin M.  ([id number]) Patient Medications Allergies: No Known Allergies Notifications Medication Indication Start End Cipro DOSE oral 500 mg tablet - tablet oral twoce daily for 14 days Electronic Signature(s) Signed: 12/07/2014 1:17:45 PM By: [name] MD, FACS Signed: 12/07/2014 5:00:03 PM By: Curtis Sites Previous Signature: 12/04/2014 4:38:19 PM Version By: [name] MD, FACS Previous Signature: 12/04/2014 5:40:15 PM Version By: Elliot Gurney RN, BSN, Kim RN, BSN Entered By: Curtis Sites on 12/07/2014 12:22:11 Belger, [name] ([id number]) -------------------------------------------------------------------------------- Prescription 12/04/2014 Patient Name: [name]. Physician: [name] MD Date of Birth: 1976/01/22 NPI#: [id number] Sex: F DEA#: [id number] Phone #: [phone] License #: Patient Address: [facility] [address] [facility] [place], [place]  [address] [address], [address] [place], [place] [address] [phone] Allergies No Known Allergies Medication Medication: Route: Strength: Form: Cipro 500 mg tablet oral 500 mg tablet Class: QUINOLONES Dose: Frequency / Time: Indication: tablet oral twoce daily for 14 days Number of Refills: Number of Units: 0 Generic Substitution: Start Date: End Date: One Time Use: Substitution Permitted No Note to Pharmacy: Signature(s): Date(s): Electronic Signature(s) Signed: 12/07/2014 1:17:45 PM By: [name] MD, FACS Quesnell, [name] ([id number]) Signed: 12/07/2014 5:00:03 PM By: Curtis Sites Previous Signature: 12/04/2014 4:38:19 PM Version By: [name] MD, FACS Previous Signature: 12/04/2014 5:40:15 PM Version By: Elliot Gurney RN, BSN, Kim RN, BSN Entered By: Curtis Sites on 12/07/2014 12:22:11 Nesser, [name] ([id number]) --------------------------------------------------------------------------------  Problem List  Details Patient Name: AGAPITA, SAVARINO. 12/07/2014 11:30 Date of Service: AM Medical Record [id number] Number: Patient Account Number: [id number] April 15, 1976 (39 y.o. 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 12/07/2014 11:30 Date of Service: AM Medical Record [id number] Number: Patient Account Number: [id number] 1976/01/17 (39 y.o. Treating RN: Date of Birth/Sex: Female) Other Clinician: Primary Care Physician: [name] Treating Cassie Henkels Referring Physician: [name] Physician/Extender: [name] in Treatment: 1 Subjective Chief Complaint Information obtained from Patient Patients presents for treatment of an open diabetic ulcer. The patient has had an ulcerated wound on her left lower extremity for about 2 years. History of Present Illness (HPI) The following HPI elements were documented for the patient's wound: Location: left foot ulcer Quality: Patient reports No  Pain. Severity: Wound is moderately severed with some exposed structure in the wound bed. Duration: Patient has had the wound for > 2 years prior to seeking treatment at the wound center Timing: for the last 2 years she has been treated at the [facility] wound care center Context: The wound appeared gradually over time Modifying Factors: Other treatment(s) tried include:in the last month she has been operated on the left foot for an abscess of that foot and was in hospital for 2 weeks. Associated Signs and Symptoms: Periwound excoriated and wound draining heavily 39 year old patient who is known to be a diabetic comes from the office of Dr. Dario Guardian for a ulcer on the left lower extremity and foot. She has had an ulcer on the left foot and has had previous treatment at [facility]. the patient tells [place] that she has been seen as the outpatient center for a long while and has had various tests including MRIs done but she is not a very good historian. Most recently about a month ago she had a abscess drained from her left foot and was in hospital for about 2 weeks and was then discharged with a PICC line and IV antibioticstics which she knows may be vancomycin and Levaquin. She has never had a wound VAC now has she  had hyperbaric oxygen therapy. Past medical history significant for dyspnea, hypertension, hyperlipidemia, neuropathy, diabetes type 2 uncontrolled. She smokes cigarettes the latest blood reports are from March 2016 and show that her BMP is essentially normal her lipid panel was also normal her hemoglobin A1c was 9.4. 12/07/2014 -- Her tissue culture is back and she has grown significant amounts of Proteus mirabilis, pseudomonas aeruginosa and Staphylococcus aureus. The staph is a MSSA and all the cultures are sensitive to ciprofloxacin. I will start her on Cipro 500 mg by mouth twice a day for 14 days. Xray left foot -- IMPRESSION:No evidence of osteomyelitis. Healed fracture of the distal  left Baucum, Ivee M. ([id number]) third metatarsal. Objective Constitutional Pulse regular. Respirations normal and unlabored. Afebrile. Vitals Time Taken: 11:59 AM, Height: 67 in, Weight: 290 lbs, BMI: 45.4, Temperature: 97.9 F, Pulse: 83 bpm, Respiratory Rate: 18 breaths/min, Blood Pressure: 113/90 mmHg, Capillary Blood Glucose: 209 mg/dl. Eyes Nonicteric. Reactive to light. Ears, Nose, Mouth, and Throat Lips, teeth, and gums WNL.Marland Kitchen Moist mucosa without lesions . Neck supple and nontender. No palpable supraclavicular or cervical adenopathy. Normal sized without goiter. Respiratory WNL. No retractions.. Cardiovascular Pedal Pulses WNL. No clubbing, cyanosis or edema. Psychiatric Judgement and insight Intact.. No evidence of depression, anxiety, or agitation.. Integumentary (Hair, Skin) the wound continues to have some maceration and some callus and there is some debris at the edges of the wound. This will be sharply curetted.. No crepitus or fluctuance. No peri-wound warmth or erythema. No masses.. Wound #1 status is Open. Original cause of wound was Gradually Appeared. The wound is located on the Left,Lateral,Plantar Foot. The wound measures 1.9cm length x 1.3cm width x 0.3cm depth; 1.94cm^2 area and 0.582cm^3 volume. The wound is limited to skin breakdown. There is no tunneling or undermining noted. There is a large amount of purulent drainage noted. The wound margin is thickened. There is medium (34-66%) granulation within the wound bed. There is a small (1-33%) amount of necrotic tissue within the wound bed including Eschar. The periwound skin appearance exhibited: Callus, Maceration, Moist. The periwound skin appearance did not exhibit: Crepitus, Excoriation, Fluctuance, Friable, Olivera, Henryetta M. ([id number]) Induration, Localized Edema, Rash, Scarring, Dry/Scaly, Atrophie Blanche, Cyanosis, Ecchymosis, Hemosiderin Staining, Mottled, Pallor, Rubor, Erythema. Periwound  temperature was noted as No Abnormality. 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 her wound care will consist of packing this wound with silver alginate and then using a felt offloading ring around the ulceration. She will also continue wearing a Darco offloading shoe and I have again impressed upon her the need to control her diabetes. I Spent Some Time Discussing with Her the Benefits of Hyperbaric Oxygen Therapy and We Will Give Her a Complete Orientation regarding This. We Will Also Go through Checklist and Get Her Insurance certification Prior to Starting on the Therapy. She Is Agreeable and Principles regarding the Hyperbaric Oxygen Therapy and Says She Was Going to Be Compliant. Procedures Wound #1 Wound #1 is a Diabetic Wound/Ulcer of the Lower Extremity located on the Left,Lateral,Plantar Foot . There was  a Skin/Subcutaneous Tissue Debridement ([id number]) debridement with total area of 2.47 sq cm performed by Tristan Schroeder., MD. with the following instrument(s): Curette to remove Viable and Non-Viable tissue/material including Exudate, Fibrin/Slough, Eschar, Skin, Callus, and Subcutaneous after achieving pain control using Lidocaine 4% Topical Solution. A time out was conducted prior to the start of the procedure. A Moderate amount of bleeding was controlled with Silver Nitrate. The procedure was tolerated well with a pain level of 0 throughout and a pain level of 0  following the procedure. Post Debridement Measurements: 1.9cm length x 1.3cm width x 0.3cm depth; 0.582cm^3 volume. Kuchera, [name] ([id number]) Plan Wound Cleansing: Wound #1 Left,Lateral,Plantar Foot: Clean wound with Normal Saline. Anesthetic: Wound #1 Left,Lateral,Plantar Foot: Topical Lidocaine 4% cream applied to wound bed prior to debridement Primary Wound Dressing: Wound #1 Left,Lateral,Plantar Foot: Aquacel Ag Other: - felt Secondary Dressing: Wound #1 Left,Lateral,Plantar Foot: Gauze and Kerlix/Conform Dressing Change Frequency: Wound #1 Left,Lateral,Plantar Foot: Change dressing every other day. Follow-up Appointments: Wound #1 Left,Lateral,Plantar Foot: Return Appointment in 1 week. Off-Loading: Wound #1 Left,Lateral,Plantar Foot: Other: - darco front offloading shoe Hyperbaric Oxygen Therapy: Wound #1 Left,Lateral,Plantar Foot: Evaluate for HBO Therapy Indication: - wagner grade 3 The following medication(s) was prescribed: Cipro oral 500 mg tablet tablet oral twoce daily for 14 days After reviewing her recent culture and her x-rays I do not believe she needs IV antibiotics and we will continue to sleep treat her with ciprofloxacin. her wound care will consist of packing this wound with silver alginate and then using a felt offloading ring around the ulceration. She will also continue wearing a Darco offloading shoe and I have again impressed upon her the need to control her diabetes. I Spent Some Time Discussing with Her the Benefits of Hyperbaric Oxygen Therapy and We Will Give Her a Complete Orientation regarding This. We Will Also Go through Checklist and Get Her Insurance Shiffer, ALTAIR STANKO. ([id number]) certification Prior to Starting on the Therapy. She Is Agreeable and Principles regarding the Hyperbaric Oxygen Therapy and Says She Was Going to Be Compliant. All her questions have been answered and we'll see her back next week. Electronic  Signature(s) Signed: 12/07/2014 1:17:45 PM By: [name] MD, FACS Entered By: [name] on 12/07/2014 12:25:13 Wiedeman, [name] ([id number]) -------------------------------------------------------------------------------- SuperBill Details Patient Name: [name], Kailiana M. Date of Service: 12/07/2014 Medical Record Number: [id number] Patient Account Number: [id number] Date of Birth/Sex: 12-24-1975 (39 y.o. Female) Treating RN: Primary Care Physician: [name] Other Clinician: Referring Physician: [name] Treating Physician/Extender: [name] in Treatment: 1 Diagnosis Coding ICD-10 Codes Code Description E11.621 Type 2 diabetes mellitus with foot ulcer E08.40 Diabetes mellitus due to underlying condition with diabetic neuropathy, unspecified L97.513 Non-pressure chronic ulcer of other part of right foot with necrosis of muscle F17.218 Nicotine dependence, cigarettes, with other nicotine-induced disorders E66.01 Morbid (severe) obesity due to excess calories Facility Procedures CPT4: Description Modifier Quantity Code [id number] 11042 - DEB SUBQ TISSUE 20 SQ CM/< 1 ICD-10 Description Diagnosis E11.621 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 Physician Procedures CPT4: Description Modifier Quantity Code [id number] 11042 - WC PHYS SUBQ TISS 20 SQ CM 1 ICD-10 Description Diagnosis E11.621 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 Electronic Signature(s) Signed: 12/07/2014 1:17:45 PM By: [name] MD, FACS Entered By: [name] on 12/07/2014 12:25:30

## 2014-12-07 ENCOUNTER — Encounter: Payer: Medicaid Other | Admitting: Surgery

## 2014-12-07 DIAGNOSIS — L97522 Non-pressure chronic ulcer of other part of left foot with fat layer exposed: Secondary | ICD-10-CM | POA: Diagnosis not present

## 2014-12-08 NOTE — Progress Notes (Signed)
[name], [name] Visit Report for 12/07/2014 Arrival Information Details Patient Name: [name], [name] Date of Service: 12/07/2014 11:30 AM Medical Record Number: [id number] Patient Account Number: [id number] Date of Birth/Sex: 12/13/1975 (38 y.o. Female) Treating RN: Curtis Sites Primary Care Physician: [name] Other Clinician: Referring Physician: [name] Treating Physician/Extender: [name] in Treatment: 1 Visit Information History Since Last Visit Added or deleted any medications: No Patient Arrived: Ambulatory Any new allergies or adverse reactions: No Arrival Time: 11:56 Had a fall or experienced change in No Accompanied By: self activities of daily living that may affect Transfer Assistance: None risk of falls: Patient Identification Verified: Yes Signs or symptoms of abuse/neglect since last No Secondary Verification Process Yes visito Completed: Hospitalized since last visit: No Patient Has Alerts: Yes Pain Present Now: No Patient Alerts: Type II Diabetic 11/30/2014 ABI: (L) 1.24 (R) 0.92 Electronic Signature(s) Signed: 12/07/2014 5:00:03 PM By: Curtis Sites Entered By: Curtis Sites on 12/07/2014 11:56:50 Mcmanaman, [name] ([id number]) -------------------------------------------------------------------------------- Encounter Discharge Information Details Patient Name: [name], [name] Date of Service: 12/07/2014 11:30 AM Medical Record Number: [id number] Patient Account Number: [id number] Date of Birth/Sex: 1975-11-09 (38 y.o. Female) Treating RN: Primary Care Physician: [name] Other Clinician: Referring Physician: [name] Treating Physician/Extender: [name] in Treatment: 1 Encounter Discharge Information Items Discharge Pain Level: 0 Discharge Condition: Stable Ambulatory Status: Ambulatory Discharge Destination: Home Transportation: Private Auto Accompanied By:  self Schedule Follow-up Appointment: Yes Medication Reconciliation completed and provided to Patient/Care No Mikena Masoner: Provided on Clinical Summary of Care: 12/07/2014 Form Type Recipient Paper Patient LS Electronic Signature(s) Signed: 12/07/2014 1:18:20 PM By: Curtis Sites Previous Signature: 12/07/2014 12:25:39 PM Version By: Gwenlyn Perking Entered By: Curtis Sites on 12/07/2014 13:18:20 Geiselman, [name] ([id number]) -------------------------------------------------------------------------------- Lower Extremity Assessment Details Patient Name: [name], [name] Date of Service: 12/07/2014 11:30 AM Medical Record Number: [id number] Patient Account Number: [id number] Date of Birth/Sex: 1975/12/24 (38 y.o. Female) Treating RN: Curtis Sites Primary Care Physician: [name] Other Clinician: Referring Physician: [name] Treating Physician/Extender: [name] in Treatment: 1 Vascular Assessment Pulses: Posterior Tibial Palpable: [Left:Yes] Dorsalis Pedis Palpable: [Left:Yes] Extremity colors, hair growth, and conditions: Extremity Color: [Left:Normal] Hair Growth on Extremity: [Left:Yes] Temperature of Extremity: [Left:Warm] Capillary Refill: [Left:< 3 seconds] Electronic Signature(s) Signed: 12/07/2014 5:00:03 PM By: Curtis Sites Entered By: Curtis Sites on 12/07/2014 12:04:56 Voiles, [name] ([id number]) -------------------------------------------------------------------------------- Multi Wound Chart Details Patient Name: [name], [name] Date of Service: 12/07/2014 11:30 AM Medical Record Number: [id number] Patient Account Number: [id number] Date of Birth/Sex: 1976-05-07 (38 y.o. Female) Treating RN: Curtis Sites Primary Care Physician: [name] Other Clinician: Referring Physician: [name] Treating Physician/Extender: [name] in Treatment: 1 Vital Signs Height(in): 67 Capillary  Blood 209 Glucose(mg/dl): Weight(lbs): 188 Pulse(bpm): 83 Body Mass Index(BMI): 45 Blood Pressure Temperature(F): 97.9 113/90 (mmHg): Respiratory Rate 18 (breaths/min): Photos: [1:No Photos] [N/A:N/A] Wound Location: [1:Left Foot - Plantar, Lateral N/A] Wounding Event: [1:Gradually Appeared] [N/A:N/A] Primary Etiology: [1:Diabetic Wound/Ulcer of N/A the Lower Extremity] Comorbid History: [1:Type II Diabetes] [N/A:N/A] Date Acquired: [1:11/21/2013] [N/A:N/A] Weeks of Treatment: [1:1] [N/A:N/A] Wound Status: [1:Open] [N/A:N/A] Measurements L x W x D 1.9x1.3x0.3 [N/A:N/A] (cm) Area (cm) : [1:1.94] [N/A:N/A] Volume (cm) : [1:0.582] [N/A:N/A] % Reduction in Area: [1:13.60%] [N/A:N/A] % Reduction in Volume: 71.20% [N/A:N/A] Classification: [1:Grade 3] [N/A:N/A] Wagner Verification: [1:Abscess] [N/A:N/A] Exudate Amount: [1:Large] [N/A:N/A] Exudate Type: [1:Purulent] [N/A:N/A] Exudate Color: [1:yellow, brown, green] [N/A:N/A] Foul Odor After [1:Yes] [N/A:N/A] Cleansing: Odor Anticipated Due to No [  N/A:N/A] Product Use: Wound Margin: [1:Thickened] [N/A:N/A] Granulation Amount: [1:Medium (34-66%)] [N/A:N/A] Necrotic Amount: [1:Small (1-33%)] [N/A:N/A] Necrotic Tissue: [1:Eschar] [N/A:N/A] Exposed Structures: [N/A:N/A] Fascia: No Fat: No Tendon: No Muscle: No Joint: No Bone: No Limited to Skin Breakdown Epithelialization: None N/A N/A Periwound Skin Texture: Callus: Yes N/A N/A Edema: No Excoriation: No Induration: No Crepitus: No Fluctuance: No Friable: No Rash: No Scarring: No Periwound Skin Maceration: Yes N/A N/A Moisture: Moist: Yes Dry/Scaly: No Periwound Skin Color: Atrophie Blanche: No N/A N/A Cyanosis: No Ecchymosis: No Erythema: No Hemosiderin Staining: No Mottled: No Pallor: No Rubor: No Temperature: No Abnormality N/A N/A Tenderness on No N/A N/A Palpation: Wound Preparation: Ulcer Cleansing: N/A N/A Rinsed/Irrigated  with Saline Topical Anesthetic Applied: Other: lidocaine 4% Treatment Notes Electronic Signature(s) Signed: 12/07/2014 5:00:03 PM By: Curtis Sites Entered By: Curtis Sites on 12/07/2014 12:05:18 [name], [name] ([id number]) -------------------------------------------------------------------------------- Multi-Disciplinary Care Plan Details Patient Name: [name], [name] Date of Service: 12/07/2014 11:30 AM Medical Record Number: [id number] Patient Account Number: [id number] Date of Birth/Sex: [birth year] (38 y.o. Female) Treating RN: Curtis Sites Primary Care Physician: [name] Other Clinician: Referring Physician: [name] Treating Physician/Extender: [name] in Treatment: 1 Active Inactive Nutrition Nursing Diagnoses: Potential for alteratiion in Nutrition/Potential for imbalanced nutrition Goals: Patient/caregiver agrees to and verbalizes understanding of need to use nutritional supplements and/or vitamins as prescribed Date Initiated: 11/30/2014 Goal Status: Active Interventions: Assess patient nutrition upon admission and as needed per policy Notes: Orientation to the Wound Care Program Nursing Diagnoses: Knowledge deficit related to the wound healing center program Goals: Patient/caregiver will verbalize understanding of the Wound Healing Center Program Date Initiated: 11/30/2014 Goal Status: Active Interventions: Provide education on orientation to the wound center Notes: Peripheral Neuropathy Nursing Diagnoses: Potential alteration in peripheral tissue perfusion (select prior to confirmation of diagnosis) Goals: Patient/caregiver will verbalize understanding of disease process and disease management Necaise, [name] ([id number]) Date Initiated: 11/30/2014 Goal Status: Active Interventions: Assess signs and symptoms of neuropathy upon admission and as needed Notes: Wound/Skin Impairment Nursing Diagnoses: Impaired tissue  integrity Knowledge deficit related to smoking impact on wound healing Goals: Ulcer/skin breakdown will have a volume reduction of 30% by week 4 Date Initiated: 11/30/2014 Goal Status: Active Interventions: Assess ulceration(s) every visit Notes: Electronic Signature(s) Signed: 12/07/2014 5:00:03 PM By: Curtis Sites Entered By: Curtis Sites on 12/07/2014 12:05:11 [name], [name] ([id number]) -------------------------------------------------------------------------------- Patient/Caregiver Education Details Patient Name: [name], [name] Date of Service: 12/07/2014 11:30 AM Medical Record Number: [id number] Patient Account Number: [id number] Date of Birth/Gender: 01-26-1976 (39 y.o. Female) Treating RN: Curtis Sites Primary Care Physician: [name] Other Clinician: Referring Physician: [name] Treating Physician/Extender: [name] in Treatment: 1 Education Assessment Education Provided To: Patient Education Topics Provided Wound/Skin Impairment: Handouts: Other: wound care as ordered Methods: Demonstration, Explain/Verbal Responses: State content correctly Electronic Signature(s) Signed: 12/07/2014 1:18:39 PM By: Curtis Sites Entered By: Curtis Sites on 12/07/2014 13:18:39 [name], [name] ([id number]) -------------------------------------------------------------------------------- Wound Assessment Details Patient Name: [name], [name] Date of Service: 12/07/2014 11:30 AM Medical Record Number: [id number] Patient Account Number: [id number] Date of Birth/Sex: 1976/02/13 (38 y.o. Female) Treating RN: Curtis Sites Primary Care Physician: [name] Other Clinician: Referring Physician: [name] Treating Physician/Extender: [name] in Treatment: 1 Wound Status Wound Number: 1 Primary Diabetic Wound/Ulcer of the Lower Etiology: Extremity Wound Location: Left Foot - Plantar, Lateral Wound Status:  Open Wounding Event: Gradually Appeared Comorbid Type II Diabetes Date Acquired: 11/21/2013 History: Weeks Of Treatment: 1 Clustered Wound:  No Photos Photo Uploaded By: Curtis Sites on 12/07/2014 16:31:05 Wound Measurements Length: (cm) 1.9 Width: (cm) 1.3 Depth: (cm) 0.3 Area: (cm) 1.94 Volume: (cm) 0.582 % Reduction in Area: 13.6% % Reduction in Volume: 71.2% Epithelialization: None Tunneling: No Undermining: No Wound Description Classification: Grade 3 Wagner Verification: Abscess Wound Margin: Thickened Exudate Amount: Large Exudate Type: Purulent Exudate Color: yellow, brown, green Foul Odor After Cleansing: Yes Due to Product Use: No Wound Bed Granulation Amount: Medium (34-66%) Exposed Structure Necrotic Amount: Small (1-33%) Fascia Exposed: No Necrotic Quality: Eschar Fat Layer Exposed: No [name], [name] ([id number]) Tendon Exposed: No Muscle Exposed: No Joint Exposed: No Bone Exposed: No Limited to Skin Breakdown Periwound Skin Texture Texture Color No Abnormalities Noted: No No Abnormalities Noted: No Callus: Yes Atrophie Blanche: No Crepitus: No Cyanosis: No Excoriation: No Ecchymosis: No Fluctuance: No Erythema: No Friable: No Hemosiderin Staining: No Induration: No Mottled: No Localized Edema: No Pallor: No Rash: No Rubor: No Scarring: No Temperature / Pain Moisture Temperature: No Abnormality No Abnormalities Noted: No Dry / Scaly: No Maceration: Yes Moist: Yes Wound Preparation Ulcer Cleansing: Rinsed/Irrigated with Saline Topical Anesthetic Applied: Other: lidocaine 4%, Treatment Notes Wound #1 (Left, Lateral, Plantar Foot) 1. Cleansed with: Clean wound with Normal Saline 2. Anesthetic Topical Lidocaine 4% cream to wound bed prior to debridement 4. Dressing Applied: Aquacel Ag Other dressing (specify in notes) 5. Secondary Dressing Applied Kerlix/Conform 6. Footwear/Offloading device applied Other  footwear/offloading device applied (specify in notes) 7. Secured with Tape Notes Felt around wound for off-loading. Darco front offloading shoe Rogan, Sherrelle Judie Petit. ([id number]) Electronic Signature(s) Signed: 12/07/2014 5:00:03 PM By: Curtis Sites Entered By: Curtis Sites on 12/07/2014 12:04:11 Hillock, [name] ([id number]) -------------------------------------------------------------------------------- Vitals Details Patient Name: Cornfield, Eniya M. Date of Service: 12/07/2014 11:30 AM Medical Record Number: [id number] Patient Account Number: [id number] Date of Birth/Sex: 1976-04-07 (38 y.o. Female) Treating RN: Curtis Sites Primary Care Physician: [name] Other Clinician: Referring Physician: [name] Treating Physician/Extender: [name] in Treatment: 1 Vital Signs Time Taken: 11:59 Temperature (F): 97.9 Height (in): 67 Pulse (bpm): 83 Weight (lbs): 290 Respiratory Rate (breaths/min): 18 Body Mass Index (BMI): 45.4 Blood Pressure (mmHg): 113/90 Capillary Blood Glucose (mg/dl): 102 Reference Range: 80 - 120 mg / dl Electronic Signature(s) Signed: 12/07/2014 5:00:03 PM By: Curtis Sites Entered By: Curtis Sites on 12/07/2014 11:59:16

## 2014-12-14 ENCOUNTER — Encounter: Payer: Medicaid Other | Admitting: Surgery

## 2014-12-14 DIAGNOSIS — L97522 Non-pressure chronic ulcer of other part of left foot with fat layer exposed: Secondary | ICD-10-CM | POA: Diagnosis not present

## 2014-12-15 NOTE — Progress Notes (Signed)
[name], [name] ([id number]) Visit Report for 12/14/2014 Chief Complaint Document Details Patient Name: [name], [name] 12/14/2014 11:30 Date of Service: AM Medical Record [id number] Number: Patient Account Number: [id number] [birth year] (38 y.o. Treating RN: Date of Birth/Sex: Female) Other Clinician: Primary Care Physician: [name] Treating Annastasia Haskins Referring Physician: [name] Physician/Extender: [name] in Treatment: 2 Information Obtained from: Patient Chief Complaint Patients presents for treatment of an open diabetic ulcer. The patient has had an ulcerated wound on her left lower extremity for about 2 years. Electronic Signature(s) Signed: 12/14/2014 12:19:52 PM By: [name] MD, FACS Entered By: [name] on 12/14/2014 12:07:30 Maske, [name] ([id number]) -------------------------------------------------------------------------------- Debridement Details Patient Name: [name], [name]. 12/14/2014 11:30 Date of Service: AM Medical Record [id number] Number: Patient Account Number: [id number] 1976/02/16 (38 y.o. Treating RN: Date of Birth/Sex: Female) Other Clinician: Primary Care Physician: [name] Treating Bryndon Cumbie Referring Physician: [name] Physician/Extender: [name] in Treatment: 2 Debridement Performed for Wound #1 Left,Lateral,Plantar Foot Assessment: Performed By: Physician Tristan Schroeder., MD Debridement: Open Wound/Selective Debridement Selective Description: Pre-procedure Yes Verification/Time Out Taken: Start Time: 11:59 Pain Control: Lidocaine 4% Topical Solution Level: Non-Viable Tissue Total Area Debrided (L x 1.8 (cm) x 1 (cm) = 1.8 (cm) W): Tissue and other Viable, Non-Viable, Callus, Eschar, Fibrin/Slough, Skin, Subcutaneous material debrided: Instrument: Curette Bleeding: Minimum Hemostasis Achieved: Pressure End Time: 12:03 Procedural Pain: 0 Post Procedural Pain: 0 Response  to Treatment: Procedure was tolerated well Post Debridement Measurements of Total Wound Length: (cm) 2 Width: (cm) 1.5 Depth: (cm) 0.3 Volume: (cm) 0.707 Electronic Signature(s) Signed: 12/14/2014 12:19:52 PM By: [name] MD, FACS Entered By: [name] on 12/14/2014 12:07:16 Kotarski, [name] ([id number]) -------------------------------------------------------------------------------- HPI Details Patient Name: [name], [name]. 12/14/2014 11:30 Date of Service: AM Medical Record [id number] Number: Patient Account Number: [id number] [birth year] (38 y.o. Treating RN: Date of Birth/Sex: Female) Other Clinician: Primary Care Physician: [name] Treating Caridad Silveira Referring Physician: [name] Physician/Extender: Weeks in Treatment: 2 History of Present Illness Location: left foot ulcer Quality: Patient reports No Pain. Severity: Wound is moderately severed with some exposed structure in the wound bed. Duration: Patient has had the wound for > 2 years prior to seeking treatment at the wound center Timing: for the last 2 years she has been treated at the [facility] wound care center Context: The wound appeared gradually over time Modifying Factors: Other treatment(s) tried include:in the last month she has been operated on the left foot for an abscess of that foot and was in hospital for 2 weeks. Associated Signs and Symptoms: Periwound excoriated and wound draining heavily HPI Description: 39 year old patient who is known to be a diabetic comes from the office of Dr. Dario Guardian for a ulcer on the left lower extremity and foot. She has had an ulcer on the left foot and has had previous treatment at [facility]. the patient tells [place] that she has been seen as the outpatient center for a long while and has had various tests including MRIs done but she is not a very good historian. Most recently about a month ago she had a abscess drained from her left foot and was in hospital  for about 2 weeks and was then discharged with a PICC line and IV antibioticstics which she knows may be vancomycin and Levaquin. She has never had a wound VAC now has she had hyperbaric oxygen therapy. Past medical history significant for dyspnea, hypertension, hyperlipidemia, neuropathy, diabetes type 2 uncontrolled. She smokes cigarettes the latest  blood reports are from March 2016 and show that her BMP is essentially normal her lipid panel was also normal her hemoglobin A1c was 9.4. 12/07/2014 -- Her tissue culture is back and she has grown significant amounts of Proteus mirabilis, pseudomonas aeruginosa and Staphylococcus aureus. The staph is a MSSA and all the cultures are sensitive to ciprofloxacin. I will start her on Cipro 500 mg by mouth twice a day for 14 days. Xray left foot -- IMPRESSION:No evidence of osteomyelitis. Healed fracture of the distal left third metatarsal. Electronic Signature(s) Signed: 12/14/2014 12:19:52 PM By: [name] MD, FACS Entered By: [name] on 12/14/2014 12:07:35 Elk, [name] ([id number]) -------------------------------------------------------------------------------- Callus Pairing Details Patient Name: [name], [name]. 12/14/2014 11:30 Date of Service: AM Medical Record [id number] Number: Patient Account Number: [id number] 02/08/1976 (38 y.o. Treating RN: Date of Birth/Sex: Female) Other Clinician: Primary Care Physician: [name] Treating Terrianne Cavness Referring Physician: [name] Physician/Extender: [name] in Treatment: 2 Procedure Performed for: Wound #1 Left,Lateral,Plantar Foot Performed By: Physician Tristan Schroeder., MD Notes still has extensive callus surrounding the ulceration and I have sharply debrided this down with a curette and saucerized the wound. Electronic Signature(s) Signed: 12/14/2014 12:19:52 PM By: [name] MD, FACS Entered By: [name] on 12/14/2014 12:06:54 Galik, [name] ([id number]) -------------------------------------------------------------------------------- Physical Exam Details Patient Name: IJA, LEWY. 12/14/2014 11:30 Date of Service: AM Medical Record [id number] Number: Patient Account Number: [id number] [birth year] (38 y.o. Treating RN: Date of Birth/Sex: Female) Other Clinician: Primary Care Physician: [name] Treating Liyah Higham Referring Physician: [name] Physician/Extender: Weeks in Treatment: 2 Constitutional . Pulse regular. Respirations normal and unlabored. Afebrile. . Eyes Nonicteric. Reactive to light. Ears, Nose, Mouth, and Throat Lips, teeth, and gums WNL.Marland Kitchen Moist mucosa without lesions . Neck supple and nontender. No palpable supraclavicular or cervical adenopathy. Normal sized without goiter. Respiratory WNL. No retractions.. Cardiovascular Pedal Pulses WNL. No clubbing, cyanosis or edema. Musculoskeletal Adexa without tenderness or enlargement.. Digits and nails w/o clubbing, cyanosis, infection, petechiae, ischemia, or inflammatory conditions.. Integumentary (Hair, Skin) she has significant callus in dead skin around the ulceration and also has some fibrin and slough at the depth of the. No crepitus or fluctuance. No peri-wound warmth or erythema. No masses.. Electronic Signature(s) Signed: 12/14/2014 12:19:52 PM By: [name] MD, FACS Entered By: [name] on 12/14/2014 12:08:14 [place], [name] ([id number]) -------------------------------------------------------------------------------- Physician Orders Details Patient Name: MEAGHAN, SIBOLE. 12/14/2014 11:30 Date of Service: AM Medical Record [id number] Number: Patient Account Number: [id number] Jul 04, 1975 (38 y.o. Treating RN: Clover Mealy, RN, BSN, [place] Date of Birth/Sex: Female) Other Clinician: Primary Care Physician: [name] Treating Neriah Brott Referring Physician: [name] Physician/Extender: [name] in  Treatment: 2 Verbal / Phone Orders: Yes Clinician: Afful, RN, BSN, [name] Read Back and Verified: Yes Diagnosis Coding Wound Cleansing Wound #1 Left,Lateral,Plantar Foot o Clean wound with Normal Saline. Anesthetic Wound #1 Left,Lateral,Plantar Foot o Topical Lidocaine 4% cream applied to wound bed prior to debridement Primary Wound Dressing Wound #1 Left,Lateral,Plantar Foot o Aquacel Ag o Other: - felt Secondary Dressing Wound #1 Left,Lateral,Plantar Foot o Gauze and Kerlix/Conform Dressing Change Frequency Wound #1 Left,Lateral,Plantar Foot o Change dressing every other day. Follow-up Appointments Wound #1 Left,Lateral,Plantar Foot o Return Appointment in 1 week. Off-Loading Wound #1 Left,Lateral,Plantar Foot o Other: - darco front offloading shoe Hyperbaric Oxygen Therapy Wound #1 Left,Lateral,Plantar Foot o Evaluate for HBO Therapy o Indication: - wagner grade 3 Wittke, [name] ([id number]) Electronic Signature(s) Signed: 12/14/2014 12:19:52 PM By: Meyer Russel,  Glory Buff MD, FACS Signed: 12/14/2014 5:53:15 PM By: Elpidio Eric BSN, RN Entered By: Elpidio Eric on 12/14/2014 12:05:00 Missildine, [name] ([id number]) -------------------------------------------------------------------------------- Problem List Details Patient Name: NEVA, RAMASWAMY. 12/14/2014 11:30 Date of Service: AM Medical Record [id number] Number: Patient Account Number: [id number] Sep 17, 1975 (38 y.o. 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 12/14/2014 11:30 Date of Service: AM Medical Record [id number] Number: Patient Account Number: [id number] 1976/04/04 (38 y.o. Treating RN: Date of Birth/Sex: Female) Other Clinician: Primary Care Physician: [name] Treating Kabria Hetzer Referring Physician: [name] Physician/Extender: [name] in Treatment: 2 Subjective Chief Complaint Information obtained from Patient Patients presents for treatment of an open diabetic ulcer. The patient has had an ulcerated wound on her left lower extremity for about 2 years. History of Present Illness (HPI) The following HPI elements were documented for the patient's wound: Location: left foot ulcer Quality: Patient reports No Pain. Severity: Wound is moderately severed with some exposed structure in the wound bed. Duration: Patient has had the wound for > 2 years prior to seeking treatment at the wound center Timing: for the last 2 years she has been treated at the [facility] wound care center Context: The wound appeared gradually over time Modifying Factors: Other treatment(s) tried include:in the last month she has been operated on the left foot for an abscess of that foot and was in hospital for 2 weeks. Associated Signs and Symptoms: Periwound excoriated and wound draining heavily 39 year old patient who is known to be a diabetic comes from the office of Dr. Dario Guardian for a ulcer on the  left lower extremity and foot. She has had an ulcer on the left foot and has had previous treatment at [facility]. the patient tells [place] that she has been seen as the outpatient center for a long while and has had various tests including MRIs done but she is not a very good historian. Most recently about a month ago she had a abscess drained from her left foot and was in hospital for about 2 weeks and was then discharged with a PICC line and IV antibioticstics which she knows may be vancomycin and Levaquin. She has never had a wound VAC now has she had hyperbaric oxygen therapy. Past medical history significant for dyspnea, hypertension, hyperlipidemia, neuropathy, diabetes type 2 uncontrolled. She smokes cigarettes the latest blood reports are from March 2016 and show that her BMP is essentially normal her lipid panel was also normal her hemoglobin A1c was 9.4. 12/07/2014 -- Her tissue culture is back and she has grown significant amounts of Proteus mirabilis, pseudomonas aeruginosa and Staphylococcus aureus. The staph is a MSSA and all the cultures are sensitive to ciprofloxacin. I will start her on Cipro 500 mg by mouth twice a day for 14 days. Xray left foot --  IMPRESSION:No evidence of osteomyelitis. Healed fracture of the distal left Carino, Sharece M. ([id number]) third metatarsal. Objective Constitutional Pulse regular. Respirations normal and unlabored. Afebrile. Vitals Time Taken: 11:46 AM, Height: 67 in, Weight: 290 lbs, BMI: 45.4, Temperature: 98.6 F, Pulse: 79 bpm, Respiratory Rate: 18 breaths/min, Blood Pressure: 130/56 mmHg. Eyes Nonicteric. Reactive to light. Ears, Nose, Mouth, and Throat Lips, teeth, and gums WNL.Marland Kitchen Moist mucosa without lesions . Neck supple and nontender. No palpable supraclavicular or cervical adenopathy. Normal sized without goiter. Respiratory WNL. No retractions.. Cardiovascular Pedal Pulses WNL. No clubbing, cyanosis or edema. Musculoskeletal Adexa  without tenderness or enlargement.. Digits and nails w/o clubbing, cyanosis, infection, petechiae, ischemia, or inflammatory conditions.. Integumentary (Hair, Skin) she has significant callus in dead skin around the ulceration and also has some fibrin and slough at the depth of the. No crepitus or fluctuance. No peri-wound warmth or erythema. No masses.. Wound #1 status is Open. Original cause of wound was Gradually Appeared. The wound is located on the Left,Lateral,Plantar Foot. The wound measures 1.8cm length x 1cm width x 0.4cm depth; 1.414cm^2 area and 0.565cm^3 volume. The wound is limited to skin breakdown. There is no tunneling or undermining noted. There is a large amount of serosanguineous drainage noted. The wound margin is thickened. There is medium (34-66%) granulation within the wound bed. There is a small (1-33%) amount of necrotic tissue within the wound bed including Adherent Slough. The periwound skin appearance exhibited: Callus, Maceration, Moist. The periwound skin appearance did not exhibit: Crepitus, Excoriation, Fluctuance, Friable, Induration, Localized Edema, Rash, Scarring, Dry/Scaly, Atrophie Blanche, Cyanosis, Ecchymosis, Elsayed, Carrolyn M. ([id number]) Hemosiderin Staining, Mottled, Pallor, Rubor, Erythema. Periwound temperature was noted as No Abnormality. 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 I have addressed her smoking again and she is down to 6 cigarettes a day and she says she is trying  very hard. She is agreeable and has worked out a Insurance underwriter of coming for hyperbaric oxygen therapy and we will now run her insurance to make sure all is good and also do her orientation. She will come back and see me next week. Procedures Wound #1 Wound #1 is a Diabetic Wound/Ulcer of the Lower Extremity located on the Left,Lateral,Plantar Foot . There was a Non-Viable Tissue Open Wound/Selective [phone]) debridement with total area of 1.8 sq cm performed by Tristan Schroeder., MD. with the following instrument(s): Curette to remove Viable and Non-Viable tissue/material including Fibrin/Slough, Eschar, Skin, Callus, and Subcutaneous after achieving pain control using Lidocaine 4% Topical Solution. A time out was conducted prior to the start of the procedure. A Minimum amount of bleeding was controlled with Pressure. The procedure was tolerated well with a pain level of 0 throughout and a pain level of 0 following the procedure. Post Debridement Measurements: 2cm length x 1.5cm width x 0.3cm depth; 0.707cm^3 volume. Wound #1 is a Diabetic Wound/Ulcer of the Lower Extremity located on the Left,Lateral,Plantar Foot . An Callus Pairing procedure was performed by Tristan Schroeder., MD. Notes: still has extensive callus surrounding the ulceration and I have sharply debrided this down with a  curette and saucerized the wound. Nola, [name] ([id number]) Plan Wound Cleansing: Wound #1 Left,Lateral,Plantar Foot: Clean wound with Normal Saline. Anesthetic: Wound #1 Left,Lateral,Plantar Foot: Topical Lidocaine 4% cream applied to wound bed prior to debridement Primary Wound Dressing: Wound #1 Left,Lateral,Plantar Foot: Aquacel Ag Other: - felt Secondary Dressing: Wound #1 Left,Lateral,Plantar Foot: Gauze and Kerlix/Conform Dressing Change Frequency: Wound #1 Left,Lateral,Plantar Foot: Change dressing every other day. Follow-up Appointments: Wound #1 Left,Lateral,Plantar Foot: Return  Appointment in 1 week. Off-Loading: Wound #1 Left,Lateral,Plantar Foot: Other: - darco front offloading shoe Hyperbaric Oxygen Therapy: Wound #1 Left,Lateral,Plantar Foot: Evaluate for HBO Therapy Indication: - wagner grade 3 I will continue her on silver alginate packing and a felt ring around this and offloading with the Darco shoe. I have addressed her smoking again and she is down to 6 cigarettes a day and she says she is trying very hard. She is agreeable and has worked out a Insurance underwriter of coming for hyperbaric oxygen therapy and we will now run her insurance to make sure all is good and also do her orientation. She will come back and see me next week. Electronic Signature(s) Bastone, ZABELLA WEASE ([id number]) Signed: 12/14/2014 12:19:52 PM By: [name] MD, FACS Entered By: [name] on 12/14/2014 12:09:25 Craddock, [name] ([id number]) -------------------------------------------------------------------------------- SuperBill Details Patient Name: Christner, Mikaiya M. Date of Service: 12/14/2014 Medical Record Number: [id number] Patient Account Number: [id number] Date of Birth/Sex: Feb 16, 1976 (39 y.o. Female) Treating RN: Primary Care Physician: [name] Other Clinician: Referring Physician: [name] Treating Physician/Extender: [name] in Treatment: 2 Diagnosis Coding ICD-10 Codes Code Description E11.621 Type 2 diabetes mellitus with foot ulcer E08.40 Diabetes mellitus due to underlying condition with diabetic neuropathy, unspecified L97.513 Non-pressure chronic ulcer of other part of right foot with necrosis of muscle F17.218 Nicotine dependence, cigarettes, with other nicotine-induced disorders E66.01 Morbid (severe) obesity due to excess calories Facility Procedures CPT4: Description Modifier Quantity Code [id number] - DEBRIDE WOUND 1ST 20 SQ CM OR < 1 ICD-10 Description Diagnosis E11.621 Type 2 diabetes mellitus with foot ulcer E08.40  Diabetes mellitus due to underlying condition with diabetic neuropathy,  unspecified L97.513 Non-pressure chronic ulcer of other part of right foot with necrosis of muscle CPT4: [id number] - PARE BENIGN LES; SGL 59 1 ICD-10 Description Diagnosis E11.621 Type 2 diabetes mellitus with foot ulcer E08.40 Diabetes mellitus due to underlying condition with diabetic neuropathy, unspecified L97.513 Non-pressure chronic ulcer  of other part of right foot with necrosis of muscle Physician Procedures CPT4: Description Modifier Quantity Code [id number] - WC PHYS DEBR WO ANESTH 20 SQ CM 1 ICD-10 Description Diagnosis Kushner, [name] ([id number]) Electronic Signature(s) Signed: 12/14/2014 12:19:52 PM By: [name] MD, FACS Entered By: [name] on 12/14/2014 12:10:13

## 2014-12-15 NOTE — Progress Notes (Signed)
KORAIMA, ALBERTSEN ([id number]) Visit Report for 12/14/2014 Arrival Information Details Patient Name: NASHLA, ALTHOFF. Date of Service: 12/14/2014 11:30 AM Medical Record Number: [id number] Patient Account Number: [id number] Date of Birth/Sex: 1975/08/25 (39 y.o. Female) Treating RN: Afful, RN, BSN, [place] Sink Primary Care Physician: [name] Other Clinician: Referring Physician: [name] Treating Physician/Extender: [name] in Treatment: 2 Visit Information History Since Last Visit Any new allergies or adverse reactions: No Patient Arrived: Ambulatory Had a fall or experienced change in No Arrival Time: 11:45 activities of daily living that may affect Accompanied By: SELF risk of falls: Transfer Assistance: None Hospitalized since last visit: No Patient Identification Verified: Yes Has Dressing in Place as Prescribed: Yes Secondary Verification Process Yes Has Footwear/Offloading in Place as Yes Completed: Prescribed: Patient Has Alerts: Yes Left: Wedge Patient Alerts: Type II Diabetic Shoe 11/30/2014 ABI: Pain Present Now: No (L) 1.24 (R) 0.92 Electronic Signature(s) Signed: 12/14/2014 5:53:15 PM By: Elpidio Eric BSN, RN Entered By: Elpidio Eric on 12/14/2014 11:45:32 Ackerley, [name] ([id number]) -------------------------------------------------------------------------------- Encounter Discharge Information Details Patient Name: Keimig, Necole M. Date of Service: 12/14/2014 11:30 AM Medical Record Number: [id number] Patient Account Number: [id number] Date of Birth/Sex: 1975-08-19 (39 y.o. Female) Treating RN: Afful, RN, BSN, [place] Sink Primary Care Physician: [name] Other Clinician: Referring Physician: [name] Treating Physician/Extender: [name] in Treatment: 2 Encounter Discharge Information Items Discharge Pain Level: 0 Discharge Condition: Stable Ambulatory Status: Ambulatory Discharge Destination:  Home Transportation: Private Auto Accompanied By: self Schedule Follow-up Appointment: No Medication Reconciliation completed and provided to Patient/Care No Franky Reier: Provided on Clinical Summary of Care: 12/14/2014 Form Type Recipient Paper Patient LS Electronic Signature(s) Signed: 12/14/2014 5:53:15 PM By: Elpidio Eric BSN, RN Previous Signature: 12/14/2014 12:16:01 PM Version By: Gwenlyn Perking Entered By: Elpidio Eric on 12/14/2014 17:03:28 Ozdemir, [name] ([id number]) -------------------------------------------------------------------------------- Lower Extremity Assessment Details Patient Name: Brackins, Gillian M. Date of Service: 12/14/2014 11:30 AM Medical Record Number: [id number] Patient Account Number: [id number] Date of Birth/Sex: 01-16-1976 (39 y.o. Female) Treating RN: Afful, RN, BSN, [place] Sink Primary Care Physician: [name] Other Clinician: Referring Physician: [name] Treating Physician/Extender: [name] in Treatment: 2 Edema Assessment Assessed: [Left: No] [Right: No] Edema: [Left: N] [Right: o] Vascular Assessment Claudication: Claudication Assessment [Left:None] Pulses: Posterior Tibial Dorsalis Pedis Palpable: [Left:Yes] Extremity colors, hair growth, and conditions: Extremity Color: [Left:Normal] Hair Growth on Extremity: [Left:No] Temperature of Extremity: [Left:Warm] Capillary Refill: [Left:< 3 seconds] Dependent Rubor: [Left:No] Blanched when Elevated: [Left:No] Lipodermatosclerosis: [Left:No] Toe Nail Assessment Left: Right: Thick: Yes Discolored: Yes Deformed: No Improper Length and Hygiene: Yes Electronic Signature(s) Signed: 12/14/2014 5:53:15 PM By: Elpidio Eric BSN, RN Entered By: Elpidio Eric on 12/14/2014 11:50:22 Bilal, [name] ([id number]) -------------------------------------------------------------------------------- Multi Wound Chart Details Patient Name: Lucado, Avea M. Date of Service:  12/14/2014 11:30 AM Medical Record Number: [id number] Patient Account Number: [id number] Date of Birth/Sex: November 30, 1975 (39 y.o. Female) Treating RN: Clover Mealy, RN, BSN, [place] Sink Primary Care Physician: [name] Other Clinician: Referring Physician: [name] Treating Physician/Extender: [name] in Treatment: 2 Vital Signs Height(in): 67 Pulse(bpm): 79 Weight(lbs): 290 Blood Pressure 130/56 (mmHg): Body Mass Index(BMI): 45 Temperature(F): 98.6 Respiratory Rate 18 (breaths/min): Photos: [1:No Photos] [N/A:N/A] Wound Location: [1:Left Foot - Plantar, Lateral N/A] Wounding Event: [1:Gradually Appeared] [N/A:N/A] Primary Etiology: [1:Diabetic Wound/Ulcer of N/A the Lower Extremity] Comorbid History: [1:Type II Diabetes] [N/A:N/A] Date Acquired: [1:11/21/2013] [N/A:N/A] Weeks of Treatment: [1:2] [N/A:N/A] Wound Status: [1:Open] [N/A:N/A] Measurements L x W x D 1.8x1x0.4 [N/A:N/A] (cm) Area (cm) : [  1:1.414] [N/A:N/A] Volume (cm) : [1:0.565] [N/A:N/A] % Reduction in Area: [1:37.00%] [N/A:N/A] % Reduction in Volume: 72.10% [N/A:N/A] Classification: [1:Grade 3] [N/A:N/A] Wagner Verification: [1:Abscess] [N/A:N/A] Exudate Amount: [1:Large] [N/A:N/A] Exudate Type: [1:Serosanguineous] [N/A:N/A] Exudate Color: [1:red, brown] [N/A:N/A] Foul Odor After [1:Yes] [N/A:N/A] Cleansing: Odor Anticipated Due to No [N/A:N/A] Product Use: Wound Margin: [1:Thickened] [N/A:N/A] Granulation Amount: [1:Medium (34-66%)] [N/A:N/A] Necrotic Amount: [1:Small (1-33%)] [N/A:N/A] Exposed Structures: [1:Fascia: No Fat: No Tendon: No] [N/A:N/A] Muscle: No Joint: No Bone: No Limited to Skin Breakdown Epithelialization: None N/A N/A Periwound Skin Texture: Callus: Yes N/A N/A Edema: No Excoriation: No Induration: No Crepitus: No Fluctuance: No Friable: No Rash: No Scarring: No Periwound Skin Moist: Yes N/A N/A Moisture: Maceration: No Dry/Scaly: No Periwound Skin Color:  Atrophie Blanche: No N/A N/A Cyanosis: No Ecchymosis: No Erythema: No Hemosiderin Staining: No Mottled: No Pallor: No Rubor: No Temperature: No Abnormality N/A N/A Tenderness on No N/A N/A Palpation: Wound Preparation: Ulcer Cleansing: N/A N/A Rinsed/Irrigated with Saline Topical Anesthetic Applied: Other: lidocaine 4% Treatment Notes Electronic Signature(s) Signed: 12/14/2014 5:53:15 PM By: Elpidio Eric BSN, RN Entered By: Elpidio Eric on 12/14/2014 11:57:02 Kasperski, [name] ([id number]) -------------------------------------------------------------------------------- Multi-Disciplinary Care Plan Details Patient Name: Strayer, [name] Date of Service: 12/14/2014 11:30 AM Medical Record Number: [id number] Patient Account Number: [id number] Date of Birth/Sex: 1975/11/09 (39 y.o. Female) Treating RN: Afful, RN, BSN, [place] Sink Primary Care Physician: [name] Other Clinician: Referring Physician: [name] Treating Physician/Extender: [name] in Treatment: 2 Active Inactive Nutrition Nursing Diagnoses: Potential for alteratiion in Nutrition/Potential for imbalanced nutrition Goals: Patient/caregiver agrees to and verbalizes understanding of need to use nutritional supplements and/or vitamins as prescribed Date Initiated: 11/30/2014 Goal Status: Active Interventions: Assess patient nutrition upon admission and as needed per policy Notes: Orientation to the Wound Care Program Nursing Diagnoses: Knowledge deficit related to the wound healing center program Goals: Patient/caregiver will verbalize understanding of the Wound Healing Center Program Date Initiated: 11/30/2014 Goal Status: Active Interventions: Provide education on orientation to the wound center Notes: Peripheral Neuropathy Nursing Diagnoses: Potential alteration in peripheral tissue perfusion (select prior to confirmation of diagnosis) Goals: Patient/caregiver will verbalize  understanding of disease process and disease management Sahli, YESSENIA [place]. ([id number]) Date Initiated: 11/30/2014 Goal Status: Active Interventions: Assess signs and symptoms of neuropathy upon admission and as needed Notes: Wound/Skin Impairment Nursing Diagnoses: Impaired tissue integrity Knowledge deficit related to smoking impact on wound healing Goals: Ulcer/skin breakdown will have a volume reduction of 30% by week 4 Date Initiated: 11/30/2014 Goal Status: Active Interventions: Assess ulceration(s) every visit Notes: Electronic Signature(s) Signed: 12/14/2014 5:53:15 PM By: Elpidio Eric BSN, RN Entered By: Elpidio Eric on 12/14/2014 11:56:53 Colasurdo, [name] ([id number]) -------------------------------------------------------------------------------- Pain Assessment Details Patient Name: Shawler, Arnesia M. Date of Service: 12/14/2014 11:30 AM Medical Record Number: [id number] Patient Account Number: [id number] Date of Birth/Sex: 01-03-1976 (39 y.o. Female) Treating RN: Clover Mealy, RN, BSN, [place] Sink Primary Care Physician: [name] Other Clinician: Referring Physician: [name] Treating Physician/Extender: [name] in Treatment: 2 Active Problems Location of Pain Severity and Description of Pain Patient Has Paino No Site Locations Pain Management and Medication Current Pain Management: Electronic Signature(s) Signed: 12/14/2014 5:53:15 PM By: Elpidio Eric BSN, RN Entered By: Elpidio Eric on 12/14/2014 11:46:28 Asberry, [name] ([id number]) -------------------------------------------------------------------------------- Patient/Caregiver Education Details Patient Name: Albarran, [name] Date of Service: 12/14/2014 11:30 AM Medical Record Number: [id number] Patient Account Number: [id number] Date of Birth/Gender: 12-14-1975 (39 y.o. Female) Treating RN: Afful, RN, BSN, [facility] Primary Care Physician:  [name] Other Clinician: Referring Physician:  [name] Treating Physician/Extender: [name] in Treatment: 2 Education Assessment Education Provided To: Patient Education Topics Provided Basic Hygiene: Methods: Explain/Verbal Responses: State content correctly Offloading: Methods: Explain/Verbal Responses: State content correctly Welcome To The Wound Care Center: Methods: Explain/Verbal Wound/Skin Impairment: Methods: Explain/Verbal Responses: State content correctly Electronic Signature(s) Signed: 12/14/2014 5:53:15 PM By: Elpidio Eric BSN, RN Entered By: Elpidio Eric on 12/14/2014 17:03:57 Cisnero, [name] ([id number]) -------------------------------------------------------------------------------- Wound Assessment Details Patient Name: Dario, Jezabel M. Date of Service: 12/14/2014 11:30 AM Medical Record Number: [id number] Patient Account Number: [id number] Date of Birth/Sex: 1976/06/04 (39 y.o. Female) Treating RN: Afful, RN, BSN, [place] Sink Primary Care Physician: [name] Other Clinician: Referring Physician: [name] Treating Physician/Extender: [name] in Treatment: 2 Wound Status Wound Number: 1 Primary Diabetic Wound/Ulcer of the Lower Etiology: Extremity Wound Location: Left Foot - Plantar, Lateral Wound Status: Open Wounding Event: Gradually Appeared Comorbid Type II Diabetes Date Acquired: 11/21/2013 History: Weeks Of Treatment: 2 Clustered Wound: No Photos Photo Uploaded By: Elpidio Eric on 12/14/2014 17:16:03 Wound Measurements Length: (cm) 1.8 Width: (cm) 1 Depth: (cm) 0.4 Area: (cm) 1.414 Volume: (cm) 0.565 % Reduction in Area: 37% % Reduction in Volume: 72.1% Epithelialization: None Tunneling: No Undermining: No Wound Description Classification: Grade 3 Wagner Verification: Abscess Wound Margin: Thickened Exudate Amount: Large Exudate Type: Serosanguineous Exudate Color: red, brown Foul Odor After Cleansing: Yes Due to Product Use: No Wound  Bed Granulation Amount: Medium (34-66%) Exposed Structure Necrotic Amount: Small (1-33%) Fascia Exposed: No Necrotic Quality: Adherent Slough Fat Layer Exposed: No Taliaferro, Terrilynn M. ([id number]) Tendon Exposed: No Muscle Exposed: No Joint Exposed: No Bone Exposed: No Limited to Skin Breakdown Periwound Skin Texture Texture Color No Abnormalities Noted: No No Abnormalities Noted: No Callus: Yes Atrophie Blanche: No Crepitus: No Cyanosis: No Excoriation: No Ecchymosis: No Fluctuance: No Erythema: No Friable: No Hemosiderin Staining: No Induration: No Mottled: No Localized Edema: No Pallor: No Rash: No Rubor: No Scarring: No Temperature / Pain Moisture Temperature: No Abnormality No Abnormalities Noted: No Dry / Scaly: No Maceration: Yes Moist: Yes Wound Preparation Ulcer Cleansing: Rinsed/Irrigated with Saline Topical Anesthetic Applied: Other: lidocaine 4%, Treatment Notes Wound #1 (Left, Lateral, Plantar Foot) 1. Cleansed with: Clean wound with Normal Saline May Shower, gently pat wound dry prior to applying new dressing. May shower with protection 4. Dressing Applied: Aquacel Ag 5. Secondary Dressing Applied Dry Gauze Kerlix/Conform 6. Footwear/Offloading device applied Felt/Foam Wedge shoe Notes Felt around wound for off-loading. [employer] front offloading Engineering geologist) Signed: 12/14/2014 5:53:15 PM By: Elpidio Eric BSN, RN [facility], [place]. ([id number]) Entered By: Elpidio Eric on 12/14/2014 12:01:43 Legendre, [name] ([id number]) -------------------------------------------------------------------------------- Vitals Details Patient Name: Jividen, Yeslin M. Date of Service: 12/14/2014 11:30 AM Medical Record Number: [id number] Patient Account Number: [id number] Date of Birth/Sex: 1975/07/09 (39 y.o. Female) Treating RN: Afful, RN, BSN, [place] Sink Primary Care Physician: [name] Other Clinician: Referring Physician: [name] Treating Physician/Extender: [name] in Treatment: 2 Vital Signs Time Taken: 11:46 Temperature (F): 98.6 Height (in): 67 Pulse (bpm): 79 Weight (lbs): 290 Respiratory Rate (breaths/min): 18 Body Mass Index (BMI): 45.4 Blood Pressure (mmHg): 130/56 Reference Range: 80 - 120 mg / dl Electronic Signature(s) Signed: 12/14/2014 5:53:15 PM By: Elpidio Eric BSN, RN Entered By: Elpidio Eric on 12/14/2014 11:48:23

## 2014-12-21 ENCOUNTER — Encounter: Payer: Medicaid Other | Admitting: Surgery

## 2014-12-21 DIAGNOSIS — L97522 Non-pressure chronic ulcer of other part of left foot with fat layer exposed: Secondary | ICD-10-CM | POA: Diagnosis not present

## 2014-12-22 NOTE — Progress Notes (Signed)
[employer], [name] ([id number]) Visit Report for 12/21/2014 Arrival Information Details Patient Name: [name], [name] Date of Service: 12/21/2014 4:00 PM Medical Record Number: [id number] Patient Account Number: [id number] Date of Birth/Sex: 12-18-1975 (38 y.o. Female) Treating RN: Afful, RN, BSN, [place] Sink Primary Care Physician: [name] Other Clinician: Referring Physician: [name] Treating Physician/Extender: [name] in Treatment: 3 Visit Information History Since Last Visit Any new allergies or adverse reactions: No Patient Arrived: Ambulatory Had a fall or experienced change in No Arrival Time: 16:00 activities of daily living that may affect Accompanied By: self risk of falls: Transfer Assistance: None Signs or symptoms of abuse/neglect since last No Patient Identification Verified: Yes visito Secondary Verification Process Yes Hospitalized since last visit: No Completed: Has Dressing in Place as Prescribed: Yes Patient Has Alerts: Yes Has Footwear/Offloading in Place as Yes Patient Alerts: Type II Diabetic Prescribed: 11/30/2014 ABI: Left: Wedge (L) 1.24 (R) Shoe 0.92 Pain Present Now: No Electronic Signature(s) Signed: 12/21/2014 4:58:02 PM By: Elpidio Eric BSN, RN Entered By: Elpidio Eric on 12/21/2014 16:01:04 [place], [name] ([id number]) -------------------------------------------------------------------------------- Encounter Discharge Information Details Patient Name: [name], [name] Date of Service: 12/21/2014 4:00 PM Medical Record Number: [id number] Patient Account Number: [id number] Date of Birth/Sex: 12/01/1975 (38 y.o. Female) Treating RN: Clover Mealy, RN, BSN, [place] Sink Primary Care Physician: [name] Other Clinician: Referring Physician: [name] Treating Physician/Extender: [name] in Treatment: 3 Encounter Discharge Information Items Discharge Pain Level: 0 Discharge Condition: Stable Ambulatory  Status: Ambulatory Discharge Destination: Home Private Transportation: Auto Accompanied By: self Schedule Follow-up Appointment: No Medication Reconciliation completed and No provided to Patient/Care Keilyn Nadal: Clinical Summary of Care: Electronic Signature(s) Signed: 12/21/2014 4:58:02 PM By: Elpidio Eric BSN, RN Entered By: Elpidio Eric on 12/21/2014 16:37:59 [place]re[place], [name] ([id number]) -------------------------------------------------------------------------------- Lower Extremity Assessment Details Patient Name: [name], [name] Date of Service: 12/21/2014 4:00 PM Medical Record Number: [id number] Patient Account Number: [id number] Date of Birth/Sex: May 25, 1976 (38 y.o. Female) Treating RN: Afful, RN, BSN, [place] Sink Primary Care Physician: [name] Other Clinician: Referring Physician: [name] Treating Physician/Extender: [name] in Treatment: 3 Edema Assessment Assessed: [Left: No] [Right: No] Edema: [Left: N] [Right: o] Vascular Assessment Pulses: Posterior Tibial Dorsalis Pedis Palpable: [Left:Yes] Extremity colors, hair growth, and conditions: Extremity Color: [Left:Normal] Hair Growth on Extremity: [Left:Yes] Temperature of Extremity: [Left:Warm] Capillary Refill: [Left:< 3 seconds] Dependent Rubor: [Left:No] Blanched when Elevated: [Left:No] Lipodermatosclerosis: [Left:No] Toe Nail Assessment Left: Right: Thick: Yes Discolored: Yes Deformed: No Improper Length and Hygiene: No Electronic Signature(s) Signed: 12/21/2014 4:58:02 PM By: Elpidio Eric BSN, RN Entered By: Elpidio Eric on 12/21/2014 16:03:24 [place], [name] ([id number]) -------------------------------------------------------------------------------- Multi Wound Chart Details Patient Name: [name], [name] Date of Service: 12/21/2014 4:00 PM Medical Record Number: [id number] Patient Account Number: [id number] Date of Birth/Sex: 1976/01/12 (38 y.o. Female) Treating  RN: Clover Mealy, RN, BSN, [place] Sink Primary Care Physician: [name] Other Clinician: Referring Physician: [name] Treating Physician/Extender: [name] in Treatment: 3 Vital Signs Height(in): 67 Pulse(bpm): 78 Weight(lbs): 290 Blood Pressure 126/78 (mmHg): Body Mass Index(BMI): 45 Temperature(F): 98.2 Respiratory Rate 16 (breaths/min): Photos: [1:No Photos] [N/A:N/A] Wound Location: [1:Left Foot - Plantar, Lateral N/A] Wounding Event: [1:Gradually Appeared] [N/A:N/A] Primary Etiology: [1:Diabetic Wound/Ulcer of N/A the Lower Extremity] Comorbid History: [1:Type II Diabetes] [N/A:N/A] Date Acquired: [1:11/21/2013] [N/A:N/A] Weeks of Treatment: [1:3] [N/A:N/A] Wound Status: [1:Open] [N/A:N/A] Measurements L x W x D 1.7x1x0.5 [N/A:N/A] (cm) Area (cm) : [1:1.335] [N/A:N/A] Volume (cm) : [1:0.668] [N/A:N/A] % Reduction in Area: [1:40.60%] [  N/A:N/A] % Reduction in Volume: 67.00% [N/A:N/A] Classification: [1:Grade 3] [N/A:N/A] Wagner Verification: [1:Abscess] [N/A:N/A] Exudate Amount: [1:Large] [N/A:N/A] Exudate Type: [1:Serosanguineous] [N/A:N/A] Exudate Color: [1:red, brown] [N/A:N/A] Foul Odor After [1:Yes] [N/A:N/A] Cleansing: Odor Anticipated Due to No [N/A:N/A] Product Use: Wound Margin: [1:Thickened] [N/A:N/A] Granulation Amount: [1:Medium (34-66%)] [N/A:N/A] Necrotic Amount: [1:Small (1-33%)] [N/A:N/A] Exposed Structures: [1:Fascia: No Fat: No Tendon: No] [N/A:N/A] Muscle: No Joint: No Bone: No Limited to Skin Breakdown Epithelialization: Small (1-33%) N/A N/A Periwound Skin Texture: Callus: Yes N/A N/A Edema: No Excoriation: No Induration: No Crepitus: No Fluctuance: No Friable: No Rash: No Scarring: No Periwound Skin Maceration: Yes N/A N/A Moisture: Moist: Yes Dry/Scaly: No Periwound Skin Color: Atrophie Blanche: No N/A N/A Cyanosis: No Ecchymosis: No Erythema: No Hemosiderin Staining: No Mottled: No Pallor: No Rubor:  No Temperature: No Abnormality N/A N/A Tenderness on No N/A N/A Palpation: Wound Preparation: Ulcer Cleansing: N/A N/A Rinsed/Irrigated with Saline Topical Anesthetic Applied: Other: lidocaine 4% Treatment Notes Electronic Signature(s) Signed: 12/21/2014 4:58:02 PM By: Elpidio EricAfful, [name] BSN, RN Entered By: Elpidio EricAfful, [name] on 12/21/2014 16:08:27 Dethloff, [name] ([id number]) -------------------------------------------------------------------------------- Multi-Disciplinary Care Plan Details Patient Name: Pettry, Tywanna M. Date of Service: 12/21/2014 4:00 PM Medical Record Number: [id number] Patient Account Number: [id number] Date of Birth/Sex: 1976/03/18 (38 y.o. Female) Treating RN: Afful, RN, BSN, [place] Sinkita Primary Care Physician: Sherrie MustacheJADALI, FAYEGH Other Clinician: Referring Physician: Sherrie MustacheJADALI, FAYEGH Treating Physician/Extender: Rudene ReBritto, Errol Weeks in Treatment: 3 Active Inactive Nutrition Nursing Diagnoses: Potential for alteratiion in Nutrition/Potential for imbalanced nutrition Goals: Patient/caregiver agrees to and verbalizes understanding of need to use nutritional supplements and/or vitamins as prescribed Date Initiated: 11/30/2014 Goal Status: Active Interventions: Assess patient nutrition upon admission and as needed per policy Notes: Orientation to the Wound Care Program Nursing Diagnoses: Knowledge deficit related to the wound healing center program Goals: Patient/caregiver will verbalize understanding of the Wound Healing Center Program Date Initiated: 11/30/2014 Goal Status: Active Interventions: Provide education on orientation to the wound center Notes: Peripheral Neuropathy Nursing Diagnoses: Potential alteration in peripheral tissue perfusion (select prior to confirmation of diagnosis) Goals: Patient/caregiver will verbalize understanding of disease process and disease management Thau, [name] ([id number]) Date Initiated: 11/30/2014 Goal Status:  Active Interventions: Assess signs and symptoms of neuropathy upon admission and as needed Notes: Wound/Skin Impairment Nursing Diagnoses: Impaired tissue integrity Knowledge deficit related to smoking impact on wound healing Goals: Ulcer/skin breakdown will have a volume reduction of 30% by week 4 Date Initiated: 11/30/2014 Goal Status: Active Interventions: Assess ulceration(s) every visit Notes: Electronic Signature(s) Signed: 12/21/2014 4:58:02 PM By: Elpidio EricAfful, [name] BSN, RN Entered By: Elpidio EricAfful, [name] on 12/21/2014 16:08:13 Fill, [name] ([id number]) -------------------------------------------------------------------------------- Pain Assessment Details Patient Name: Sol, Tylan M. Date of Service: 12/21/2014 4:00 PM Medical Record Number: [id number] Patient Account Number: [id number] Date of Birth/Sex: 1976/03/18 (38 y.o. Female) Treating RN: Clover MealyAfful, RN, BSN, [place] Sinkita Primary Care Physician: Sherrie MustacheJADALI, FAYEGH Other Clinician: Referring Physician: Sherrie MustacheJADALI, FAYEGH Treating Physician/Extender: Rudene ReBritto, Errol Weeks in Treatment: 3 Active Problems Location of Pain Severity and Description of Pain Patient Has Paino No Site Locations Pain Management and Medication Current Pain Management: Electronic Signature(s) Signed: 12/21/2014 4:58:02 PM By: Elpidio EricAfful, [name] BSN, RN Entered By: Elpidio EricAfful, [name] on 12/21/2014 16:02:37 Lubinski, [name] ([id number]) -------------------------------------------------------------------------------- Patient/Caregiver Education Details Patient Name: Croke, [name] Date of Service: 12/21/2014 4:00 PM Medical Record Number: [id number] Patient Account Number: [id number] Date of Birth/Gender: 1976/03/18 (39 y.o. Female) Treating RN: Afful, RN, BSN,  Sinkita Primary Care Physician: Sherrie MustacheJADALI, FAYEGH Other Clinician: Referring Physician: Sherrie MustacheJADALI, FAYEGH Treating Physician/Extender: Meyer RusselBritto,  Cecilie Kicks in Treatment: 3 Education Assessment Education Provided  To: Patient Education Topics Provided Offloading: Methods: Explain/Verbal Responses: State content correctly Welcome To The [facility]: Methods: Explain/Verbal Responses: State content correctly Electronic Signature(s) Signed: 12/21/2014 4:58:02 PM By: Elpidio Eric BSN, RN Entered By: Elpidio Eric on 12/21/2014 16:38:15 Munter, [name] ([id number]) -------------------------------------------------------------------------------- Wound Assessment Details Patient Name: Karman, Tarica M. Date of Service: 12/21/2014 4:00 PM Medical Record Number: [id number] Patient Account Number: [id number] Date of Birth/Sex: [birth year] (38 y.o. Female) Treating RN: Afful, RN, BSN, [place] Sink Primary Care Physician: [name] Other Clinician: Referring Physician: [name] Treating Physician/Extender: [name] in Treatment: 3 Wound Status Wound Number: 1 Primary Diabetic Wound/Ulcer of the Lower Etiology: Extremity Wound Location: Left Foot - Plantar, Lateral Wound Status: Open Wounding Event: Gradually Appeared Comorbid Type II Diabetes Date Acquired: 11/21/2013 History: Weeks Of Treatment: 3 Clustered Wound: No Photos Photo Uploaded By: Elpidio Eric on 12/21/2014 16:55:49 Wound Measurements Length: (cm) 1.7 Width: (cm) 1 Depth: (cm) 0.5 Area: (cm) 1.335 Volume: (cm) 0.668 % Reduction in Area: 40.6% % Reduction in Volume: 67% Epithelialization: Small (1-33%) Tunneling: No Undermining: No Wound Description Classification: Grade 3 Wagner Verification: Abscess Wound Margin: Thickened Exudate Amount: Large Exudate Type: Serosanguineous Exudate Color: red, brown Foul Odor After Cleansing: Yes Due to Product Use: No Wound Bed Granulation Amount: Medium (34-66%) Exposed Structure Necrotic Amount: Small (1-33%) Fascia Exposed: No Necrotic Quality: Adherent Slough Fat Layer Exposed: No Ihde, Tema M. ([id number]) Tendon Exposed: No Muscle Exposed:  No Joint Exposed: No Bone Exposed: No Limited to Skin Breakdown Periwound Skin Texture Texture Color No Abnormalities Noted: No No Abnormalities Noted: No Callus: Yes Atrophie Blanche: No Crepitus: No Cyanosis: No Excoriation: No Ecchymosis: No Fluctuance: No Erythema: No Friable: No Hemosiderin Staining: No Induration: No Mottled: No Localized Edema: No Pallor: No Rash: No Rubor: No Scarring: No Temperature / Pain Moisture Temperature: No Abnormality No Abnormalities Noted: No Dry / Scaly: No Maceration: Yes Moist: Yes Wound Preparation Ulcer Cleansing: Rinsed/Irrigated with Saline Topical Anesthetic Applied: Other: lidocaine 4%, Treatment Notes Wound #1 (Left, Lateral, Plantar Foot) 1. Cleansed with: Clean wound with Normal Saline 2. Anesthetic Topical Lidocaine 4% cream to wound bed prior to debridement 3. Peri-wound Care: Skin Prep 4. Dressing Applied: Prisma Ag 5. Secondary Dressing Applied Gauze and Kerlix/Conform 6. Footwear/Offloading device applied Felt/Foam Wedge shoe 7. Secured with Tape Notes Myint, Yulieth M. ([id number]) Felt around wound for off-loading. [employer] front offloading Engineering geologist) Signed: 12/21/2014 4:58:02 PM By: Elpidio Eric BSN, RN Entered By: Elpidio Eric on 12/21/2014 16:07:27 Ihrig, [name] ([id number]) -------------------------------------------------------------------------------- Vitals Details Patient Name: Anderle, Keisi M. Date of Service: 12/21/2014 4:00 PM Medical Record Number: [id number] Patient Account Number: [id number] Date of Birth/Sex: [birth year] (38 y.o. Female) Treating RN: Afful, RN, BSN, [place] Sink Primary Care Physician: [name] Other Clinician: Referring Physician: [name] Treating Physician/Extender: [name] in Treatment: 3 Vital Signs Time Taken: 16:02 Temperature (F): 98.2 Height (in): 67 Pulse (bpm): 78 Weight (lbs): 290 Respiratory Rate  (breaths/min): 16 Body Mass Index (BMI): 45.4 Blood Pressure (mmHg): 126/78 Reference Range: 80 - 120 mg / dl Electronic Signature(s) Signed: 12/21/2014 4:58:02 PM By: Elpidio Eric BSN, RN Entered By: Elpidio Eric on 12/21/2014 16:02:59

## 2014-12-22 NOTE — Progress Notes (Signed)
[name] ([id number]) Visit Report for 12/21/2014 Chief Complaint Document Details Patient Name: [name], [name] Date of Service: 12/21/2014 4:00 PM Medical Record Number: [id number] Patient Account Number: [id number] Date of Birth/Sex: [birth year] (38 y.o. Female) Treating RN: Primary Care Physician: [name] Other Clinician: Referring Physician: [name] Treating Physician/Extender: [name] in Treatment: 3 Information Obtained from: Patient Chief Complaint Patients presents for treatment of an open diabetic ulcer. The patient has had an ulcerated wound on her left lower extremity for about 2 years. Electronic Signature(s) Signed: 12/21/2014 4:41:39 PM By: [name] MD, FACS Entered By: [name] on 12/21/2014 16:22:38 [name] -------------------------------------------------------------------------------- Debridement Details Patient Name: [name], [name] Date of Service: 12/21/2014 4:00 PM Medical Record Number: [id number] Patient Account Number: [id number] Date of Birth/Sex: 15-Feb-1976 (38 y.o. Female) Treating RN: Primary Care Physician: [name] Other Clinician: Referring Physician: [name] Treating Physician/Extender: [name] in Treatment: 3 Debridement Performed for Wound #1 Left,Lateral,Plantar Foot Assessment: Performed By: Physician Tristan Schroeder., MD Debridement: Debridement Pre-procedure Yes Verification/Time Out Taken: Start Time: 16:17 Pain Control: Lidocaine 4% Topical Solution Level: Skin/Subcutaneous Tissue Total Area Debrided (L x 1.7 (cm) x 1 (cm) = 1.7 (cm) W): Tissue and other Viable, Non-Viable, Exudate, Fibrin/Slough, Subcutaneous material debrided: Instrument: Curette Bleeding: Minimum Hemostasis Achieved: Pressure End Time: 16:19 Procedural Pain: 0 Post Procedural Pain: 0 Response to Treatment: Procedure was tolerated well Post Debridement  Measurements of Total Wound Length: (cm) 1.7 Width: (cm) 1 Depth: (cm) 0.5 Volume: (cm) 0.668 Electronic Signature(s) Signed: 12/21/2014 4:41:39 PM By: [name] MD, FACS Entered By: [name] on 12/21/2014 16:22:31 Bevard, Rosezella [name] ([id number]) -------------------------------------------------------------------------------- HPI Details Patient Name: [name], [name] Date of Service: 12/21/2014 4:00 PM Medical Record Number: [id number] Patient Account Number: [id number] Date of Birth/Sex: [birth year] (38 y.o. Female) Treating RN: Primary Care Physician: [name] Other Clinician: Referring Physician: [name] Treating Physician/Extender: [name] in Treatment: 3 History of Present Illness Location: left foot ulcer Quality: Patient reports No Pain. Severity: Wound is moderately severed with some exposed structure in the wound bed. Duration: Patient has had the wound for > 2 years prior to seeking treatment at the wound center Timing: for the last 2 years she has been treated at the [facility] wound care center Context: The wound appeared gradually over time Modifying Factors: Other treatment(s) tried include:in the last month she has been operated on the left foot for an abscess of that foot and was in hospital for 2 weeks. Associated Signs and Symptoms: Periwound excoriated and wound draining heavily HPI Description: 39 year old patient who is known to be a diabetic comes from the office of Dr. Dario Guardian for a ulcer on the left lower extremity and foot. She has had an ulcer on the left foot and has had previous treatment at [facility]. the patient tells [place] that she has been seen as the outpatient center for a long while and has had various tests including MRIs done but she is not a very good historian. Most recently about a month ago she had a abscess drained from her left foot and was in hospital for about 2 weeks and was then discharged with a PICC line and IV  antibioticstics which she knows may be vancomycin and Levaquin. She has never had a wound VAC now has she had hyperbaric oxygen therapy. Past medical history significant for dyspnea, hypertension, hyperlipidemia, neuropathy, diabetes type 2 uncontrolled. She smokes cigarettes the latest blood reports are from March 2016  and show that her BMP is essentially normal her lipid panel was also normal her hemoglobin A1c was 9.4. 12/07/2014 -- Her tissue culture is back and she has grown significant amounts of Proteus mirabilis, pseudomonas aeruginosa and Staphylococcus aureus. The staph is a MSSA and all the cultures are sensitive to ciprofloxacin. I will start her on Cipro 500 mg by mouth twice a day for 14 days. Xray left foot -- IMPRESSION:No evidence of osteomyelitis. Healed fracture of the distal left third metatarsal. 12/21/2014 -- she is still awaiting clearance from her insurance company for HBOT. She was seen by Dr. Dario Guardian was working on controlling her diabetes and she continues to work on giving up smoking. She has completed her dosage of ciprofloxacin and we will be continuing with local care. Electronic Signature(s) Signed: 12/21/2014 4:41:39 PM By: [name] MD, FACS Entered By: [name] on 12/21/2014 16:23:44 [place]que, Rosezella [name] ([id number]) Ta, Rosezella [name] ([id number]) -------------------------------------------------------------------------------- Callus Pairing Details Patient Name: Fineberg, [name] Date of Service: 12/21/2014 4:00 PM Medical Record Number: [id number] Patient Account Number: [id number] Date of Birth/Sex: March 13, 1976 (38 y.o. Female) Treating RN: Primary Care Physician: [name] Other Clinician: Referring Physician: [name] Treating Physician/Extender: [name] in Treatment: 3 Procedure Performed for: Wound #1 Left,Lateral,Plantar Foot Performed By: Physician Tristan Schroeder., MD Notes She is still develops extensive  callus and this will be sharply debrided with a #3 curette. Electronic Signature(s) Signed: 12/21/2014 4:41:39 PM By: [name] MD, FACS Entered By: [name] on 12/21/2014 16:22:14 Monte, Rosezella [name] ([id number]) -------------------------------------------------------------------------------- Physical Exam Details Patient Name: Obarr, Geneva M. Date of Service: 12/21/2014 4:00 PM Medical Record Number: [id number] Patient Account Number: [id number] Date of Birth/Sex: Apr 24, 1976 (38 y.o. Female) Treating RN: Primary Care Physician: [name] Other Clinician: Referring Physician: [name] Treating Physician/Extender: [name] in Treatment: 3 Constitutional . Pulse regular. Respirations normal and unlabored. Afebrile. . Eyes Nonicteric. Reactive to light. Ears, Nose, Mouth, and Throat Lips, teeth, and gums WNL.Marland Kitchen Moist mucosa without lesions . Neck supple and nontender. No palpable supraclavicular or cervical adenopathy. Normal sized without goiter. Respiratory WNL. No retractions.. Cardiovascular Pedal Pulses WNL. No clubbing, cyanosis or edema. Musculoskeletal Adexa without tenderness or enlargement.. Digits and nails w/o clubbing, cyanosis, infection, petechiae, ischemia, or inflammatory conditions.. Integumentary (Hair, Skin) her wound is significant callus surrounding it and that is clean granulation tissue and some of it will be saucerized to make this wound heal better.. No crepitus or fluctuance. No peri-wound warmth or erythema. No masses.Marland Kitchen Psychiatric Judgement and insight Intact.. No evidence of depression, anxiety, or agitation.. Electronic Signature(s) Signed: 12/21/2014 4:41:39 PM By: [name] MD, FACS Entered By: [name] on 12/21/2014 16:24:24 Pieczynski, Rosezella [name] ([id number]) -------------------------------------------------------------------------------- Physician Orders Details Patient Name: Berhane, Catalena M. Date  of Service: 12/21/2014 4:00 PM Medical Record Patient Account Number: [id number] Number: Afful, RN, BSN, Treating RN: 01-23-1976 (38 y.o.  Sink Date of Birth/Sex: Female) Other Clinician: Primary Care Physician: [name] Treating [name] Referring Physician: [name] Physician/Extender: [name] in Treatment: 3 Verbal / Phone Orders: Yes Clinician: Afful, RN, BSN, [name] Read Back and Verified: Yes Diagnosis Coding Wound Cleansing Wound #1 Left,Lateral,Plantar Foot o Clean wound with Normal Saline. Anesthetic Wound #1 Left,Lateral,Plantar Foot o Topical Lidocaine 4% cream applied to wound bed prior to debridement Primary Wound Dressing Wound #1 Left,Lateral,Plantar Foot o Aquacel Ag o Other: - felt Secondary Dressing Wound #1 Left,Lateral,Plantar Foot o Gauze and Kerlix/Conform Dressing Change Frequency Wound #1 Left,Lateral,Plantar Foot o Change  dressing every other day. Follow-up Appointments Wound #1 Left,Lateral,Plantar Foot o Return Appointment in 1 week. Off-Loading Wound #1 Left,Lateral,Plantar Foot o Other: - darco front offloading shoe Hyperbaric Oxygen Therapy Wound #1 Left,Lateral,Plantar Foot o Evaluate for HBO Therapy o Indication: - wagner grade 3 Ambrocio, Bradley M. ([id number]) Radiology o X-ray, Chest - For HBO clearance. Order sheet given to patient oooo Electronic Signature(s) Signed: 12/21/2014 4:36:57 PM By: Elpidio Eric BSN, RN Signed: 12/21/2014 4:41:39 PM By: [name] MD, FACS Entered By: Elpidio Eric on 12/21/2014 16:36:56 Christley, Rosezella [name] ([id number]) -------------------------------------------------------------------------------- Problem List Details Patient Name: Lazaro, Genevra M. Date of Service: 12/21/2014 4:00 PM Medical Record Number: [id number] Patient Account Number: [id number] Date of Birth/Sex: May 29, 1976 (38 y.o. 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 Date of Service: 12/21/2014 4:00 PM Medical Record Number: [id number] Patient Account Number: [id number] Date of Birth/Sex: 11-29-1975 (38 y.o. Female) Treating RN: Primary Care Physician: [name] Other Clinician: Referring Physician: [name] Treating Physician/Extender: [name] in Treatment: 3 Subjective Chief Complaint Information obtained from Patient Patients presents for treatment of an open diabetic ulcer. The patient has had an ulcerated wound on her left lower extremity for about 2 years. History of Present Illness (HPI) The following HPI elements were documented for the patient's wound: Location: left foot ulcer Quality: Patient reports No Pain. Severity: Wound is moderately severed with some exposed structure in the wound bed. Duration: Patient has had the wound for > 2 years prior to seeking treatment at the wound center Timing: for the last 2 years she has been treated at the [facility] wound care  center Context: The wound appeared gradually over time Modifying Factors: Other treatment(s) tried include:in the last month she has been operated on the left foot for an abscess of that foot and was in hospital for 2 weeks. Associated Signs and Symptoms: Periwound excoriated and wound draining heavily 39 year old patient who is known to be a diabetic comes from the office of Dr. Dario Guardian for a ulcer on the left lower extremity and foot. She has had an ulcer on the left foot and has had previous treatment at [facility]. the patient tells [place] that she has been seen as the outpatient center for a long while and has had various tests including MRIs done but she is not a very good historian. Most recently about a month ago she had a abscess drained from her left foot and was in hospital for about 2 weeks and was then discharged with a PICC line and IV antibioticstics which she knows may be vancomycin and Levaquin. She has never had a wound VAC now has she had hyperbaric oxygen therapy. Past medical history significant for dyspnea, hypertension, hyperlipidemia, neuropathy, diabetes type 2 uncontrolled. She smokes cigarettes the latest blood reports are from March 2016 and  show that her BMP is essentially normal her lipid panel was also normal her hemoglobin A1c was 9.4. 12/07/2014 -- Her tissue culture is back and she has grown significant amounts of Proteus mirabilis, pseudomonas aeruginosa and Staphylococcus aureus. The staph is a MSSA and all the cultures are sensitive to ciprofloxacin. I will start her on Cipro 500 mg by mouth twice a day for 14 days. Xray left foot -- IMPRESSION:No evidence of osteomyelitis. Healed fracture of the distal left third metatarsal. Bosque, Rosezella [name] ([id number]) 12/21/2014 -- she is still awaiting clearance from her insurance company for HBOT. She was seen by Dr. Dario Guardian was working on controlling her diabetes and she continues to work on giving up smoking. She  has completed her dosage of ciprofloxacin and we will be continuing with local care. Objective Constitutional Pulse regular. Respirations normal and unlabored. Afebrile. Vitals Time Taken: 4:02 PM, Height: 67 in, Weight: 290 lbs, BMI: 45.4, Temperature: 98.2 F, Pulse: 78 bpm, Respiratory Rate: 16 breaths/min, Blood Pressure: 126/78 mmHg. Eyes Nonicteric. Reactive to light. Ears, Nose, Mouth, and Throat Lips, teeth, and gums WNL.Marland Kitchen Moist mucosa without lesions . Neck supple and nontender. No palpable supraclavicular or cervical adenopathy. Normal sized without goiter. Respiratory WNL. No retractions.. Cardiovascular Pedal Pulses WNL. No clubbing, cyanosis or edema. Musculoskeletal Adexa without tenderness or enlargement.. Digits and nails w/o clubbing, cyanosis, infection, petechiae, ischemia, or inflammatory conditions.Marland Kitchen Psychiatric Judgement and insight Intact.. No evidence of depression, anxiety, or agitation.. Integumentary (Hair, Skin) her wound is significant callus surrounding it and that is clean granulation tissue and some of it will be saucerized to make this wound heal better.. No crepitus or fluctuance. No peri-wound warmth or erythema. No masses.. Wound #1 status is Open. Original cause of wound was Gradually Appeared. The wound is located on the Left,Lateral,Plantar Foot. The wound measures 1.7cm length x 1cm width x 0.5cm depth; 1.335cm^2 area and 0.668cm^3 volume. The wound is limited to skin breakdown. There is no tunneling or undermining Wigington, Maretta M. ([id number]) noted. There is a large amount of serosanguineous drainage noted. The wound margin is thickened. There is medium (34-66%) granulation within the wound bed. There is a small (1-33%) amount of necrotic tissue within the wound bed including Adherent Slough. The periwound skin appearance exhibited: Callus, Maceration, Moist. The periwound skin appearance did not exhibit: Crepitus, Excoriation,  Fluctuance, Friable, Induration, Localized Edema, Rash, Scarring, Dry/Scaly, Atrophie Blanche, Cyanosis, Ecchymosis, Hemosiderin Staining, Mottled, Pallor, Rubor, Erythema. Periwound temperature was noted as No Abnormality. 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 We will also get a chest x-ray and complete her HBOT preprocedure workup. She is urged to give up smoking completely and continue to offload as much as possible during the day. She will see me back next week. Procedures Wound #1 Wound #1 is a Diabetic Wound/Ulcer of the Lower Extremity located on the Left,Lateral,Plantar Foot . There was a Skin/Subcutaneous Tissue Debridement ([id number]) debridement with total area of 1.7 sq cm performed by Florrie Ramires, Ignacia Felling., MD. with the following instrument(s): Curette to remove Viable and Non-Viable tissue/material including Exudate, Fibrin/Slough, and Subcutaneous after achieving pain  control using Lidocaine 4% Topical Solution. A time out was conducted prior to the start of the procedure. A Minimum amount of bleeding was controlled with Pressure. The procedure was tolerated well with a pain level of 0 throughout and a pain level of 0 following the procedure. Post Debridement Measurements: 1.7cm length x 1cm width x 0.5cm depth; 0.668cm^3 volume. Wound #1 is a Diabetic Wound/Ulcer of the Lower Extremity located on the Left,Lateral,Plantar Foot . An Callus Pairing procedure was performed by Tristan SchroederBritto, Climmie Cronce J., MD. Notes: She is still develops extensive  callus Lupo, Kerrington M. ([id number]) and this will be sharply debrided with a #3 curette. Plan Wound Cleansing: Wound #1 Left,Lateral,Plantar Foot: Clean wound with Normal Saline. Anesthetic: Wound #1 Left,Lateral,Plantar Foot: Topical Lidocaine 4% cream applied to wound bed prior to debridement Primary Wound Dressing: Wound #1 Left,Lateral,Plantar Foot: Aquacel Ag Other: - felt Secondary Dressing: Wound #1 Left,Lateral,Plantar Foot: Gauze and Kerlix/Conform Dressing Change Frequency: Wound #1 Left,Lateral,Plantar Foot: Change dressing every other day. Follow-up Appointments: Wound #1 Left,Lateral,Plantar Foot: Return Appointment in 1 week. Off-Loading: Wound #1 Left,Lateral,Plantar Foot: Other: - darco front offloading shoe Hyperbaric Oxygen Therapy: Wound #1 Left,Lateral,Plantar Foot: Evaluate for HBO Therapy Indication: - wagner grade 3 Radiology ordered were: X-ray, Chest - For HBO clearance. Order sheet given to patient We will [facility], a felt ring and the darko offloading shoe. We will also get a chest x-ray and complete her HBOT preprocedure workup. She is urged to give up smoking completely and continue to offload as much as possible during the day. She will see me back next week Maull, [name] ([id number]) Electronic Signature(s) Signed: 12/21/2014 4:43:48 PM By: Evlyn KannerBritto, Dara Beidleman MD, FACS Previous Signature: 12/21/2014 4:41:39 PM Version By: Evlyn KannerBritto, Benett Swoyer MD, FACS Entered By: Evlyn KannerBritto, Kirat Mezquita on 12/21/2014 16:42:52 Girgenti, [name] ([id number]) -------------------------------------------------------------------------------- SuperBill Details Patient Name: Cwynar, Lyndal M. Date of Service: 12/21/2014 Medical Record Number: [id number] Patient Account Number: [id number] Date of Birth/Sex: [birth year] (39 y.o. Female) Treating RN: Primary Care Physician: Sherrie MustacheJADALI, FAYEGH Other Clinician: Referring Physician: Sherrie MustacheJADALI, FAYEGH Treating  Physician/Extender: Rudene ReBritto, Tyheem Boughner Weeks in Treatment: 3 Diagnosis Coding ICD-10 Codes Code Description E11.621 Type 2 diabetes mellitus with foot ulcer E08.40 Diabetes mellitus due to underlying condition with diabetic neuropathy, unspecified L97.513 Non-pressure chronic ulcer of other part of right foot with necrosis of muscle F17.218 Nicotine dependence, cigarettes, with other nicotine-induced disorders E66.01 Morbid (severe) obesity due to excess calories Facility Procedures CPT4: Description Modifier Quantity Code [id number] 11042 - DEB SUBQ TISSUE 20 SQ CM/< 1 ICD-10 Description Diagnosis E11.621 Type 2 diabetes mellitus with foot ulcer E08.40 Diabetes mellitus due to underlying condition with diabetic neuropathy,  unspecified L97.513 Non-pressure chronic ulcer of other part of right foot with necrosis of muscle CPT4: 3244010236100021 11055 - PARE BENIGN LES; SGL 1 ICD-10 Description Diagnosis E11.621 Type 2 diabetes mellitus with foot ulcer E08.40 Diabetes mellitus due to underlying condition with diabetic neuropathy, unspecified L97.513 Non-pressure chronic ulcer of  other part of right foot with necrosis of muscle Physician Procedures CPT4: Description Modifier Quantity Code [id number] 11042 - WC PHYS SUBQ TISS 20 SQ CM 1 ICD-10 Description Diagnosis Diclemente, [name] ([id number]) Electronic Signature(s) Signed: 12/21/2014 4:41:39 PM By: Evlyn KannerBritto, Tevita Gomer MD, FACS Entered By: Evlyn KannerBritto, Michaell Grider on 12/21/2014 16:26:31

## 2014-12-22 NOTE — Progress Notes (Signed)
[name], [name] ([id number]) Visit Report for 12/21/2014 HBO Risk Assessment Details Patient Name: [name], [name] Date of Service: 12/21/2014 4:00 PM Medical Record Patient Account Number: [id number] Number: [name], Treating RN: 1976/03/16 (38 y.o. [place] [name] Date of Birth/Sex: Female) Other Clinician: Primary Care Physician: [name] Treating [name] Referring Physician: Sherrie MustacheJADALI, [name] Physician/Extender: [name] in Treatment: 3 HBO Risk Assessment Items Answer Barotrauma Risks: Upper Respiratory Infections No Tracheostomy No Ear problems or surgery (otosclerosis)- Consider pressure equalization tubes No Sinus Problems, Sinus Obstruction No Pulmonary Risks: Currently seeing a pulmonologisto No Emphysema No Pneumothorax No Tuberculosis No Other lung problems (COPD with CO2 retention, lesions, surgery) -Refer to CPGs No Congestive heart Failure -Consider holding HBO if ejection fraction<30% No History of smoking Yes Bullous Disease, Blebs No Other pulmonary abnormalities No Cardiac Risks: Currently seeing a cardiologisto No Pacemaker/AICD No Hypertension Yes Diuretic Used (water pill). If yes, last time taken: No History of prior or current malignancy (Cancer) Surgery No Radiation therapy No Chemotherapy No Ophthalmic Risks: Optic Neuritis No [name], [name] ([id number]) Myopia No Retinopathy or Retinal Detachment Surgery- Consider pressure equalization tubes No Confinement Anxiety Claustrophobia No Dialysis Dialysis No Any implants; medical or non-medical No Diabetes if Yes for Diabetes: Insulin-Dependent Long acting insulin- List: [name] Short acting Insulin-List: novolog HgbA1C within 3 months Yes Seizures Seizures No Currently using these medications: Aspirin No Digoxin (CHF patient) No Narcotics No Nitroprusside No Phenothiazine (Thorazine,etc.) No Prednisone or other steroids No Disulfiram (Antabuse) No Mafenide  Acetate (Sulfamylon-burn cream) No Amiodarone No Electronic Signature(s) Signed: 12/21/2014 4:36:04 PM By: Elpidio EricAfful, [name] BSN, RN Entered By: Elpidio EricAfful, [name] on 12/21/2014 16:36:03

## 2014-12-28 ENCOUNTER — Encounter: Payer: Medicaid Other | Attending: Surgery | Admitting: Surgery

## 2014-12-28 DIAGNOSIS — I10 Essential (primary) hypertension: Secondary | ICD-10-CM | POA: Insufficient documentation

## 2014-12-28 DIAGNOSIS — E11621 Type 2 diabetes mellitus with foot ulcer: Secondary | ICD-10-CM | POA: Diagnosis not present

## 2014-12-28 DIAGNOSIS — L97421 Non-pressure chronic ulcer of left heel and midfoot limited to breakdown of skin: Secondary | ICD-10-CM | POA: Insufficient documentation

## 2014-12-28 DIAGNOSIS — E1140 Type 2 diabetes mellitus with diabetic neuropathy, unspecified: Secondary | ICD-10-CM | POA: Diagnosis not present

## 2014-12-28 DIAGNOSIS — F17218 Nicotine dependence, cigarettes, with other nicotine-induced disorders: Secondary | ICD-10-CM | POA: Diagnosis not present

## 2014-12-28 DIAGNOSIS — E785 Hyperlipidemia, unspecified: Secondary | ICD-10-CM | POA: Insufficient documentation

## 2014-12-29 NOTE — Progress Notes (Signed)
HILLARY, STRUSS ([id number]) Visit Report for 12/28/2014 Arrival Information Details Patient Name: [name], [name] Date of Service: 12/28/2014 8:45 AM Medical Record Number: [id number] Patient Account Number: [id number] Date of Birth/Sex: May 13, 1976 (39 y.o. Female) Treating RN: Huel Coventry Primary Care Physician: [name] Other Clinician: Referring Physician: [name] Treating Physician/Extender: [name] in Treatment: 4 Visit Information History Since Last Visit Added or deleted any medications: No Patient Arrived: Ambulatory Any new allergies or adverse reactions: No Arrival Time: 08:50 Had a fall or experienced change in No Accompanied By: self activities of daily living that may affect Transfer Assistance: None risk of falls: Patient Identification Verified: Yes Signs or symptoms of abuse/neglect since last No Secondary Verification Process Yes visito Completed: Hospitalized since last visit: No Patient Has Alerts: Yes Has Dressing in Place as Prescribed: Yes Patient Alerts: Type II Diabetic Has Footwear/Offloading in Place as Yes 11/30/2014 ABI: Prescribed: (L) 1.24 (R) Left: Wedge 0.92 Shoe Pain Present Now: No Electronic Signature(s) Signed: 12/28/2014 5:13:08 PM By: Elliot Gurney, RN, BSN, Kim RN, BSN Entered By: Elliot Gurney, RN, BSN, Kim on 12/28/2014 08:58:24 [name], [name] -------------------------------------------------------------------------------- Encounter Discharge Information Details Patient Name: [name], [name] Date of Service: 12/28/2014 8:45 AM Medical Record Number: [id number] Patient Account Number: [id number] Date of Birth/Sex: [birth year] (39 y.o. Female) Treating RN: Huel Coventry Primary Care Physician: [name] Other Clinician: Referring Physician: [name] Treating Physician/Extender: [name] in Treatment: 4 Encounter Discharge Information Items Discharge Pain Level: 0 Discharge  Condition: Stable Ambulatory Status: Ambulatory Discharge Destination: Home Private Transportation: Auto Accompanied By: self Schedule Follow-up Appointment: Yes Medication Reconciliation completed and Yes provided to Patient/Care Zayden Maffei: Clinical Summary of Care: Electronic Signature(s) Signed: 12/28/2014 5:13:08 PM By: Elliot Gurney, RN, BSN, Kim RN, BSN Entered By: Elliot Gurney, RN, BSN, Kim on 12/28/2014 09:26:41 [name], [name] -------------------------------------------------------------------------------- Lower Extremity Assessment Details Patient Name: [name], [name] Date of Service: 12/28/2014 8:45 AM Medical Record Number: [id number] Patient Account Number: [id number] Date of Birth/Sex: 11-29-1975 (39 y.o. Female) Treating RN: Huel Coventry Primary Care Physician: [name] Other Clinician: Referring Physician: [name] Treating Physician/Extender: [name] in Treatment: 4 Vascular Assessment Pulses: Posterior Tibial Dorsalis Pedis Palpable: [Left:Yes] Extremity colors, hair growth, and conditions: Extremity Color: [Left:Normal] Hair Growth on Extremity: [Left:Yes] Temperature of Extremity: [Left:Warm] Capillary Refill: [Left:< 3 seconds] Toe Nail Assessment Left: Right: Thick: Yes Discolored: Yes Deformed: No Improper Length and Hygiene: No Electronic Signature(s) Signed: 12/28/2014 5:13:08 PM By: Elliot Gurney, RN, BSN, Kim RN, BSN Entered By: Elliot Gurney, RN, BSN, Kim on 12/28/2014 09:03:12 [name] -------------------------------------------------------------------------------- Multi Wound Chart Details Patient Name: [name], [name] Date of Service: 12/28/2014 8:45 AM Medical Record Number: [id number] Patient Account Number: [id number] Date of Birth/Sex: 1976-01-15 (39 y.o. Female) Treating RN: Huel Coventry Primary Care Physician: [name] Other Clinician: Referring Physician: [name] Treating  Physician/Extender: [name] in Treatment: 4 Vital Signs Height(in): 67 Pulse(bpm): 78 Weight(lbs): 290 Blood Pressure 132/72 (mmHg): Body Mass Index(BMI): 45 Temperature(F): 98.2 Respiratory Rate 16 (breaths/min): Photos: [1:No Photos] [N/A:N/A] Wound Location: [1:Left Foot - Plantar, Lateral N/A] Wounding Event: [1:Gradually Appeared] [N/A:N/A] Primary Etiology: [1:Diabetic Wound/Ulcer of N/A the Lower Extremity] Comorbid History: [1:Type II Diabetes] [N/A:N/A] Date Acquired: [1:11/21/2013] [N/A:N/A] Weeks of Treatment: [1:4] [N/A:N/A] Wound Status: [1:Open] [N/A:N/A] Measurements L x W x D 1.9x1.4x0.5 [N/A:N/A] (cm) Area (cm) : [1:2.089] [N/A:N/A] Volume (cm) : [1:1.045] [N/A:N/A] % Reduction in Area: [1:7.00%] [N/A:N/A] % Reduction in Volume: 48.30% [N/A:N/A] Starting Position 1 7 (  o'clock): Ending Position 1 [1:9] (o'clock): Maximum Distance 1 0.2 (cm): Undermining: [1:Yes] [N/A:N/A] Classification: [1:Grade 3] [N/A:N/A] Wagner Verification: [1:Abscess] [N/A:N/A] Exudate Amount: [1:Large] [N/A:N/A] Exudate Type: [1:Serosanguineous] [N/A:N/A] Exudate Color: [1:red, brown] [N/A:N/A] Foul Odor After [1:Yes] [N/A:N/A] Cleansing: [1:No] [N/A:N/A] Odor Anticipated Due to Product Use: Wound Margin: Thickened N/A N/A Granulation Amount: Medium (34-66%) N/A N/A Granulation Quality: Pink N/A N/A Necrotic Amount: Small (1-33%) N/A N/A Exposed Structures: Fascia: No N/A N/A Fat: No Tendon: No Muscle: No Joint: No Bone: No Limited to Skin Breakdown Epithelialization: Small (1-33%) N/A N/A Periwound Skin Texture: Callus: Yes N/A N/A Edema: No Excoriation: No Induration: No Crepitus: No Fluctuance: No Friable: No Rash: No Scarring: No Periwound Skin Maceration: Yes N/A N/A Moisture: Moist: Yes Dry/Scaly: No Periwound Skin Color: Atrophie Blanche: No N/A N/A Cyanosis: No Ecchymosis: No Erythema: No Hemosiderin Staining: No Mottled:  No Pallor: No Rubor: No Temperature: No Abnormality N/A N/A Tenderness on No N/A N/A Palpation: Wound Preparation: Ulcer Cleansing: N/A N/A Rinsed/Irrigated with Saline Topical Anesthetic Applied: Other: lidocaine 4% Treatment Notes Electronic Signature(s) [name] ([id number]) Signed: 12/28/2014 5:13:08 PM By: Elliot Gurney, RN, BSN, Kim RN, BSN Entered By: Elliot Gurney, RN, BSN, Kim on 12/28/2014 09:11:12 Mannina, [name] ([id number]) -------------------------------------------------------------------------------- Multi-Disciplinary Care Plan Details Patient Name: LOVELL, ROE. Date of Service: 12/28/2014 8:45 AM Medical Record Number: [id number] Patient Account Number: [id number] Date of Birth/Sex: Sep 19, 1975 (39 y.o. Female) Treating RN: Huel Coventry Primary Care Physician: [name] Other Clinician: Referring Physician: [name] Treating Physician/Extender: [name] in Treatment: 4 Active Inactive Nutrition Nursing Diagnoses: Potential for alteratiion in Nutrition/Potential for imbalanced nutrition Goals: Patient/caregiver agrees to and verbalizes understanding of need to use nutritional supplements and/or vitamins as prescribed Date Initiated: 11/30/2014 Goal Status: Active Interventions: Assess patient nutrition upon admission and as needed per policy Notes: Orientation to the Wound Care Program Nursing Diagnoses: Knowledge deficit related to the wound healing center program Goals: Patient/caregiver will verbalize understanding of the Wound Healing Center Program Date Initiated: 11/30/2014 Goal Status: Active Interventions: Provide education on orientation to the wound center Notes: Peripheral Neuropathy Nursing Diagnoses: Potential alteration in peripheral tissue perfusion (select prior to confirmation of diagnosis) Goals: Patient/caregiver will verbalize understanding of disease process and disease management [name], [name] PUGA.  ([id number]) Date Initiated: 11/30/2014 Goal Status: Active Interventions: Assess signs and symptoms of neuropathy upon admission and as needed Notes: Wound/Skin Impairment Nursing Diagnoses: Impaired tissue integrity Knowledge deficit related to smoking impact on wound healing Goals: Ulcer/skin breakdown will have a volume reduction of 30% by week 4 Date Initiated: 11/30/2014 Goal Status: Active Interventions: Assess ulceration(s) every visit Notes: Electronic Signature(s) Signed: 12/28/2014 5:13:08 PM By: Elliot Gurney, RN, BSN, Kim RN, BSN Entered By: Elliot Gurney, RN, BSN, Kim on 12/28/2014 09:10:11 Hazelbaker, [name] ([id number]) -------------------------------------------------------------------------------- Pain Assessment Details Patient Name: Cavagnaro, [name] Date of Service: 12/28/2014 8:45 AM Medical Record Number: [id number] Patient Account Number: [id number] Date of Birth/Sex: 1976/04/16 (39 y.o. Female) Treating RN: Huel Coventry Primary Care Physician: [name] Other Clinician: Referring Physician: [name] Treating Physician/Extender: [name] in Treatment: 4 Active Problems Location of Pain Severity and Description of Pain Patient Has Paino No Site Locations Pain Management and Medication Current Pain Management: Electronic Signature(s) Signed: 12/28/2014 5:13:08 PM By: Elliot Gurney, RN, BSN, Kim RN, BSN Entered By: Elliot Gurney, RN, BSN, Kim on 12/28/2014 08:58:30 Farina, [name] ([id number]) -------------------------------------------------------------------------------- Patient/Caregiver Education Details Patient Name: [name]. Date of Service: 12/28/2014 8:45 AM Medical Record Number: [id number] Patient Account Number:  [id number] Date of Birth/Gender: Jan 20, 1976 (39 y.o. Female) Treating RN: Huel CoventryWoody, Kim Primary Care Physician: Sherrie MustacheJADALI, FAYEGH Other Clinician: Referring Physician: Sherrie MustacheJADALI, FAYEGH Treating Physician/Extender: Rudene ReBritto, Errol Weeks in  Treatment: 4 Education Assessment Education Provided To: Patient Education Topics Provided Wound/Skin Impairment: Handouts: Caring for Your Ulcer, Other: Change dressingmore often if wet. Electronic Signature(s) Signed: 12/28/2014 5:13:08 PM By: Elliot GurneyWoody, RN, BSN, Kim RN, BSN Entered By: Elliot GurneyWoody, RN, BSN, Kim on 12/28/2014 09:27:10 Sass, [name] ([id number]) -------------------------------------------------------------------------------- Wound Assessment Details Patient Name: [name], [name] Date of Service: 12/28/2014 8:45 AM Medical Record Number: [id number] Patient Account Number: [id number] Date of Birth/Sex: Jan 20, 1976 (39 y.o. Female) Treating RN: Huel CoventryWoody, Kim Primary Care Physician: Sherrie MustacheJADALI, FAYEGH Other Clinician: Referring Physician: Sherrie MustacheJADALI, FAYEGH Treating Physician/Extender: Rudene ReBritto, Errol Weeks in Treatment: 4 Wound Status Wound Number: 1 Primary Diabetic Wound/Ulcer of the Lower Etiology: Extremity Wound Location: Left Foot - Plantar, Lateral Wound Status: Open Wounding Event: Gradually Appeared Comorbid Type II Diabetes Date Acquired: 11/21/2013 History: Weeks Of Treatment: 4 Clustered Wound: No Wound Measurements Length: (cm) 1.9 Width: (cm) 1.4 Depth: (cm) 0.5 Area: (cm) 2.089 Volume: (cm) 1.045 % Reduction in Area: 7% % Reduction in Volume: 48.3% Epithelialization: Small (1-33%) Tunneling: No Undermining: Yes Starting Position (o'clock): 7 Ending Position (o'clock): 9 Maximum Distance: (cm) 0.2 Wound Description Classification: Grade 3 Wagner Verification: Abscess Wound Margin: Thickened Exudate Amount: Large Exudate Type: Serosanguineous Exudate Color: red, brown Foul Odor After Cleansing: Yes Due to Product Use: No Wound Bed Granulation Amount: Medium (34-66%) Exposed Structure Granulation Quality: Pink Fascia Exposed: No Necrotic Amount: Small (1-33%) Fat Layer Exposed: No Necrotic Quality: Adherent Slough Tendon Exposed: No Muscle  Exposed: No Joint Exposed: No Bone Exposed: No Limited to Skin Breakdown Periwound Skin Texture Texture Color [name], [name] ([id number]) No Abnormalities Noted: No No Abnormalities Noted: No Callus: Yes Atrophie Blanche: No Crepitus: No Cyanosis: No Excoriation: No Ecchymosis: No Fluctuance: No Erythema: No Friable: No Hemosiderin Staining: No Induration: No Mottled: No Localized Edema: No Pallor: No Rash: No Rubor: No Scarring: No Temperature / Pain Moisture Temperature: No Abnormality No Abnormalities Noted: No Dry / Scaly: No Maceration: Yes Moist: Yes Wound Preparation Ulcer Cleansing: Rinsed/Irrigated with Saline Topical Anesthetic Applied: Other: lidocaine 4%, Treatment Notes Wound #1 (Left, Lateral, Plantar Foot) 1. Cleansed with: Clean wound with Normal Saline 4. Dressing Applied: Aquacel Ag Notes Aquacel Ag, Drawtex, conform to secure. [employer] geologistshoe Electronic Signature(s) Signed: 12/28/2014 5:13:08 PM By: Elliot GurneyWoody, RN, BSN, Kim RN, BSN Entered By: Elliot GurneyWoody, RN, BSN, Kim on 12/28/2014 09:03:59 Neubecker, [name] ([id number]) -------------------------------------------------------------------------------- Vitals Details Patient Name: [name], [name] Date of Service: 12/28/2014 8:45 AM Medical Record Number: [id number] Patient Account Number: [id number] Date of Birth/Sex: Jan 20, 1976 (39 y.o. Female) Treating RN: Huel CoventryWoody, Kim Primary Care Physician: Sherrie MustacheJADALI, FAYEGH Other Clinician: Referring Physician: Sherrie MustacheJADALI, FAYEGH Treating Physician/Extender: Rudene ReBritto, Errol Weeks in Treatment: 4 Vital Signs Time Taken: 08:57 Temperature (F): 98.2 Height (in): 67 Pulse (bpm): 78 Weight (lbs): 290 Respiratory Rate (breaths/min): 16 Body Mass Index (BMI): 45.4 Blood Pressure (mmHg): 132/72 Reference Range: 80 - 120 mg / dl Electronic Signature(s) Signed: 12/28/2014 5:13:08 PM By: Elliot GurneyWoody, RN, BSN, Kim RN, BSN Entered By: Elliot GurneyWoody, RN, BSN, Kim on  12/28/2014 08:57:37

## 2014-12-29 NOTE — Progress Notes (Signed)
SYNDA, BAGENT ([id number]) Visit Report for 12/28/2014 Chief Complaint Document Details Patient Name: [name], [name] Date of Service: 12/28/2014 8:45 AM Medical Record Number: [id number] Patient Account Number: [id number] Date of Birth/Sex: 01-11-1976 (39 y.o. Female) Treating RN: Primary Care Physician: [name] Other Clinician: Referring Physician: [name] Treating Physician/Extender: [name] in Treatment: 4 Information Obtained from: Patient Chief Complaint Patients presents for treatment of an open diabetic ulcer. The patient has had an ulcerated wound on her left lower extremity for about 2 years. Electronic Signature(s) Signed: 12/28/2014 9:30:39 AM By: [name] MD, FACS Entered By: [name] on 12/28/2014 09:30:39 Mcpartlin, [name] ([id number]) -------------------------------------------------------------------------------- Debridement Details Patient Name: Zynda, [name] Date of Service: 12/28/2014 8:45 AM Medical Record Number: [id number] Patient Account Number: [id number] Date of Birth/Sex: [birth year] (39 y.o. Female) Treating RN: Primary Care Physician: [name] Other Clinician: Referring Physician: [name] Treating Physician/Extender: [name] in Treatment: 4 Debridement Performed for Wound #1 Left,Lateral,Plantar Foot Assessment: Performed By: Physician Tristan Schroeder., MD Debridement: Debridement Pre-procedure Yes Verification/Time Out Taken: Start Time: 09:08 Pain Control: Other : lidocaine 4% Level: Skin/Subcutaneous Tissue Total Area Debrided (L x 1.9 (cm) x 1.4 (cm) = 2.66 (cm) W): Tissue and other Viable, Non-Viable, Callus, Fibrin/Slough, Subcutaneous material debrided: Instrument: Curette Bleeding: Minimum Hemostasis Achieved: Pressure End Time: 09:11 Procedural Pain: 0 Post Procedural Pain: 0 Response to Treatment: Procedure was tolerated well Post Debridement Measurements  of Total Wound Length: (cm) 1.9 Width: (cm) 1.4 Depth: (cm) 0.6 Volume: (cm) 1.253 Electronic Signature(s) Signed: 12/28/2014 9:30:32 AM By: [name] MD, FACS Entered By: [name] on 12/28/2014 09:30:32 Stacey, [name] ([id number]) -------------------------------------------------------------------------------- HPI Details Patient Name: [name], [name] Date of Service: 12/28/2014 8:45 AM Medical Record Number: [id number] Patient Account Number: [id number] Date of Birth/Sex: [birth year] (39 y.o. Female) Treating RN: Primary Care Physician: [name] Other Clinician: Referring Physician: [name] Treating Physician/Extender: [name] in Treatment: 4 History of Present Illness Location: left foot ulcer Quality: Patient reports No Pain. Severity: Wound is moderately severed with some exposed structure in the wound bed. Duration: Patient has had the wound for > 2 years prior to seeking treatment at the wound center Timing: for the last 2 years she has been treated at the [facility] wound care center Context: The wound appeared gradually over time Modifying Factors: Other treatment(s) tried include:in the last month she has been operated on the left foot for an abscess of that foot and was in hospital for 2 weeks. Associated Signs and Symptoms: Periwound excoriated and wound draining heavily HPI Description: 39 year old patient who is known to be a diabetic comes from the office of Dr. Dario Guardian for a ulcer on the left lower extremity and foot. She has had an ulcer on the left foot and has had previous treatment at [facility]. the patient tells [place] that she has been seen as the outpatient center for a long while and has had various tests including MRIs done but she is not a very good historian. Most recently about a month ago she had a abscess drained from her left foot and was in hospital for about 2 weeks and was then discharged with a PICC line and IV  antibioticstics which she knows may be vancomycin and Levaquin. She has never had a wound VAC now has she had hyperbaric oxygen therapy. Past medical history significant for dyspnea, hypertension, hyperlipidemia, neuropathy, diabetes type 2 uncontrolled. She smokes cigarettes the latest blood reports are from March 2016  and show that her BMP is essentially normal her lipid panel was also normal her hemoglobin A1c was 9.4. 12/07/2014 -- Her tissue culture is back and she has grown significant amounts of Proteus mirabilis, pseudomonas aeruginosa and Staphylococcus aureus. The staph is a MSSA and all the cultures are sensitive to ciprofloxacin. I will start her on Cipro 500 mg by mouth twice a day for 14 days. Xray left foot -- IMPRESSION:No evidence of osteomyelitis. Healed fracture of the distal left third metatarsal. 12/21/2014 -- she is still awaiting clearance from her insurance company for HBOT. She was seen by Dr. Dario Guardian was working on controlling her diabetes and she continues to work on giving up smoking. She has completed her dosage of ciprofloxacin and we will be continuing with local care. Electronic Signature(s) Signed: 12/28/2014 9:30:50 AM By: [name] MD, FACS Entered By: [name] on 12/28/2014 09:30:49 Godshall, [name] ([id number]) Kintzel, [name] ([id number]) -------------------------------------------------------------------------------- Physical Exam Details Patient Name: [name], [name] Date of Service: 12/28/2014 8:45 AM Medical Record Number: [id number] Patient Account Number: [id number] Date of Birth/Sex: 01/20/1976 (39 y.o. Female) Treating RN: Primary Care Physician: [name] Other Clinician: Referring Physician: [name] Treating Physician/Extender: [name] in Treatment: 4 Constitutional . Pulse regular. Respirations normal and unlabored. Afebrile. . Eyes Nonicteric. Reactive to light. Ears, Nose, Mouth, and  Throat Lips, teeth, and gums WNL.Marland Kitchen Moist mucosa without lesions . Neck supple and nontender. No palpable supraclavicular or cervical adenopathy. Normal sized without goiter. Respiratory WNL. No retractions.. Cardiovascular Pedal Pulses WNL. No clubbing, cyanosis or edema. Musculoskeletal Adexa without tenderness or enlargement.. Digits and nails w/o clubbing, cyanosis, infection, petechiae, ischemia, or inflammatory conditions.. Integumentary (Hair, Skin) No suspicious lesions. No crepitus or fluctuance. No peri-wound warmth or erythema. No masses.Marland Kitchen Psychiatric Judgement and insight Intact.. No evidence of depression, anxiety, or agitation.. Notes the wound on the left plantar aspect has some maceration but other than that after debriding it looks very clean. Electronic Signature(s) Signed: 12/28/2014 9:31:21 AM By: [name] MD, FACS Entered By: [name] on 12/28/2014 09:31:21 Colville, [name] ([id number]) -------------------------------------------------------------------------------- Physician Orders Details Patient Name: Wojcicki, Skyley M. Date of Service: 12/28/2014 8:45 AM Medical Record Number: [id number] Patient Account Number: [id number] Date of Birth/Sex: 1975-08-08 (39 y.o. Female) Treating RN: Huel Coventry Primary Care Physician: [name] Other Clinician: Referring Physician: [name] Treating Physician/Extender: [name] in Treatment: 4 Verbal / Phone Orders: Yes Clinician: Huel Coventry Read Back and Verified: Yes Diagnosis Coding Wound Cleansing Wound #1 Left,Lateral,Plantar Foot o Clean wound with Normal Saline. Anesthetic Wound #1 Left,Lateral,Plantar Foot o Topical Lidocaine 4% cream applied to wound bed prior to debridement Primary Wound Dressing Wound #1 Left,Lateral,Plantar Foot o Aquacel Ag Secondary Dressing o Conform/Kerlix Wound #1 Left,Lateral,Plantar Foot o Drawtex Dressing Change Frequency Wound #1  Left,Lateral,Plantar Foot o Change dressing every other day. - unless wet, if wet change daily Follow-up Appointments Wound #1 Left,Lateral,Plantar Foot o Return Appointment in 1 week. Off-Loading Wound #1 Left,Lateral,Plantar Foot o Other: - darco front offloading shoe Hyperbaric Oxygen Therapy Wound #1 Left,Lateral,Plantar Foot o Evaluate for HBO Therapy o Indication: - wagner grade 3 Brunson, [name] ([id number]) Electronic Signature(s) Signed: 12/28/2014 12:23:30 PM By: [name] MD, FACS Signed: 12/28/2014 5:13:08 PM By: Elliot Gurney RN, BSN, Kim RN, BSN Entered By: Elliot Gurney, RN, BSN, Kim on 12/28/2014 09:15:06 Fontanella, [name] ([id number]) -------------------------------------------------------------------------------- Problem List Details Patient Name: Apachito, Zaire M. Date of Service: 12/28/2014 8:45 AM Medical Record Number: [id number] Patient Account Number:  [id number] Date of Birth/Sex: Mar 24, 1976 (38 y.o. 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 Date of Service: 12/28/2014 8:45 AM Medical Record Number: [id number] Patient Account Number: [id number] Date of Birth/Sex: [birth year] (39 y.o. Female) Treating RN: Primary Care Physician: [name] Other Clinician: Referring Physician: [name] Treating Physician/Extender: [name] in Treatment: 4 Subjective Chief Complaint Information obtained from Patient Patients presents for treatment of an open diabetic ulcer. The patient has had an ulcerated wound on her left lower extremity for about 2 years. History of Present Illness (HPI) The following HPI elements were documented for the patient's wound: Location: left foot ulcer Quality: Patient reports No Pain. Severity: Wound is moderately severed with some exposed structure in the wound bed. Duration: Patient has had the wound for > 2 years prior to seeking treatment at the wound center Timing: for the last 2 years she has been treated at the [facility] wound care center Context: The wound appeared gradually over time Modifying Factors: Other treatment(s) tried include:in the last month she has been operated on the left foot for an abscess of that foot and was in hospital for 2 weeks. Associated Signs and Symptoms: Periwound excoriated and wound draining heavily 39 year old patient who is known to be a diabetic comes from the office of Dr. Dario Guardian for a ulcer on the left lower extremity and foot. She has had an ulcer on the left foot and has had previous treatment at [facility]. the patient tells [place] that she has been seen as the outpatient center for a long while and has had various tests including MRIs done but she is not a very good historian. Most recently about a month ago she had a abscess drained from her left foot and was in hospital for about 2 weeks and was then discharged  with a PICC line and IV antibioticstics which she knows may be vancomycin and Levaquin. She has never had a wound VAC now has she had hyperbaric oxygen therapy. Past medical history significant for dyspnea, hypertension, hyperlipidemia, neuropathy, diabetes type 2 uncontrolled. She smokes cigarettes the latest blood reports are from March 2016 and show that her BMP is essentially normal her lipid panel was also normal her hemoglobin A1c was 9.4. 12/07/2014 -- Her tissue culture is back and she has grown significant amounts of Proteus mirabilis, pseudomonas aeruginosa and Staphylococcus aureus. The staph is a MSSA and all the cultures are sensitive to ciprofloxacin. I will start her on Cipro 500 mg by mouth twice a day for 14 days. Xray left foot -- IMPRESSION:No evidence of osteomyelitis. Healed fracture of the distal left third metatarsal. Kliethermes, [name] ([id number]) 12/21/2014 -- she is still awaiting clearance from her insurance company for HBOT. She was seen by Dr. Dario Guardian was working on controlling her diabetes and she continues to  work on giving up smoking. She has completed her dosage of ciprofloxacin and we will be continuing with local care. Objective Constitutional Pulse regular. Respirations normal and unlabored. Afebrile. Vitals Time Taken: 8:57 AM, Height: 67 in, Weight: 290 lbs, BMI: 45.4, Temperature: 98.2 F, Pulse: 78 bpm, Respiratory Rate: 16 breaths/min, Blood Pressure: 132/72 mmHg. Eyes Nonicteric. Reactive to light. Ears, Nose, Mouth, and Throat Lips, teeth, and gums WNL.Marland Kitchen. Moist mucosa without lesions . Neck supple and nontender. No palpable supraclavicular or cervical adenopathy. Normal sized without goiter. Respiratory WNL. No retractions.. Cardiovascular Pedal Pulses WNL. No clubbing, cyanosis or edema. Musculoskeletal Adexa without tenderness or enlargement.. Digits and nails w/o clubbing, cyanosis, infection, petechiae, ischemia, or inflammatory  conditions.Marland Kitchen. Psychiatric Judgement and insight Intact.. No evidence of depression, anxiety, or agitation.. General Notes: the wound on the left plantar aspect has some maceration but other than that after debriding it looks very clean. Integumentary (Hair, Skin) No suspicious lesions. No crepitus or fluctuance. No peri-wound warmth or erythema. No masses.. Wound #1 status is Open. Original cause of wound was Gradually Appeared. The wound is located on the Left,Lateral,Plantar Foot. The wound measures 1.9cm length x 1.4cm width x 0.5cm depth; 2.089cm^2 area and 1.045cm^3 volume. The wound is limited to skin breakdown. There is no tunneling noted, however, Ariola, Ardath M. ([id number]) there is undermining starting at 7:00 and ending at 9:00 with a maximum distance of 0.2cm. There is a large amount of serosanguineous drainage noted. The wound margin is thickened. There is medium (34-66%) pink granulation within the wound bed. There is a small (1-33%) amount of necrotic tissue within the wound bed including Adherent Slough. The periwound skin appearance exhibited: Callus, Maceration, Moist. The periwound skin appearance did not exhibit: Crepitus, Excoriation, Fluctuance, Friable, Induration, Localized Edema, Rash, Scarring, Dry/Scaly, Atrophie Blanche, Cyanosis, Ecchymosis, Hemosiderin Staining, Mottled, Pallor, Rubor, Erythema. Periwound temperature was noted as No Abnormality. 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 She is also working hard at giving up smoking and is down to about 2 cigarettes a day. We are awaiting her insurance clearance for hyperbaric oxygen therapy and though that is a been submitted we have not finally got the clearance. She will come back and see me next week. Procedures Wound #1 Wound #1 is a Diabetic Wound/Ulcer of the Lower Extremity located on the Left,Lateral,Plantar Foot . There was a Skin/Subcutaneous Tissue Debridement ([id number]) debridement with total area of 2.66 sq cm performed by Shray Hunley, Ignacia FellingErrol J., MD. with the following instrument(s): Curette to remove Viable and Non-Viable tissue/material including Fibrin/Slough, Callus, and Subcutaneous after achieving pain control using Other (lidocaine 4%). A time out was conducted prior to the start of the procedure. A Minimum amount of bleeding was controlled with Pressure. The procedure was tolerated well with a pain level of 0 throughout and a pain level of 0 following the procedure. Post Debridement Measurements: 1.9cm length x 1.4cm width x 0.6cm depth; 1.253cm^3 volume. Siever, [name] ([id number]) Plan Wound Cleansing: Wound #1 Left,Lateral,Plantar Foot: Clean wound with Normal Saline. Anesthetic:  Wound #1 Left,Lateral,Plantar Foot: Topical Lidocaine 4% cream applied to wound bed prior to debridement Primary Wound Dressing: Wound #1 Left,Lateral,Plantar Foot: Aquacel Ag Secondary Dressing: Conform/Kerlix Wound #1 Left,Lateral,Plantar Foot: Drawtex Dressing Change Frequency: Wound #1 Left,Lateral,Plantar Foot: Change dressing every other day. - unless wet, if wet change daily Follow-up Appointments: Wound #1 Left,Lateral,Plantar Foot: Return Appointment in 1 week. Off-Loading: Wound #1 Left,Lateral,Plantar Foot: Other: - darco front offloading shoe Hyperbaric Oxygen Therapy: Wound #1 Left,Lateral,Plantar Foot: Evaluate for HBO Therapy Indication: - wagner grade  3 We will continue with silver alginate and use a piece of DrawTex over this and I have asked her to change her dressing daily if the wound is very wet. She is also working hard at giving up smoking and is down to about 2 cigarettes a day. We are awaiting her insurance clearance for hyperbaric oxygen therapy and though that is a been submitted we have not finally got the clearance. She will come back and see me next week. Electronic Signature(s) Signed: 12/28/2014 9:32:46 AM By: [name] MD, FACS Entered By: [name] on 12/28/2014 09:32:46 Brogan, [name] ([id number]) -------------------------------------------------------------------------------- SuperBill Details Patient Name: Guzzi, Denym M. Date of Service: 12/28/2014 Medical Record Number: [id number] Patient Account Number: [id number] Date of Birth/Sex: 11-17-1975 (39 y.o. Female) Treating RN: Primary Care Physician: [name] Other Clinician: Referring Physician: [name] Treating Physician/Extender: [name] in Treatment: 4 Diagnosis Coding ICD-10 Codes Code Description E11.621 Type 2 diabetes mellitus with foot ulcer E08.40 Diabetes mellitus due to underlying condition with diabetic neuropathy, unspecified L97.513 Non-pressure chronic ulcer of other part of right foot with necrosis of muscle F17.218 Nicotine dependence, cigarettes, with other nicotine-induced disorders E66.01 Morbid (severe) obesity due to excess calories Facility Procedures CPT4: Description Modifier Quantity Code 08657846 11042 - DEB SUBQ TISSUE 20 SQ CM/< 1 ICD-10 Description Diagnosis E11.621 Type 2 diabetes mellitus with foot ulcer E08.40 Diabetes mellitus due to underlying condition with diabetic neuropathy,  unspecified L97.513 Non-pressure chronic ulcer of other part of right foot with necrosis of muscle F17.218 Nicotine dependence, cigarettes, with other nicotine-induced disorders Physician Procedures CPT4:  Description Modifier Quantity Code [id number] 11042 - WC PHYS SUBQ TISS 20 SQ CM 1 ICD-10 Description Diagnosis E11.621 Type 2 diabetes mellitus with foot ulcer E08.40 Diabetes mellitus due to underlying condition with diabetic neuropathy, unspecified  L97.513 Non-pressure chronic ulcer of other part of right foot with necrosis of muscle F17.218 Nicotine dependence, cigarettes, with other nicotine-induced disorders Pulver, [name] ([id number]) Electronic Signature(s) Signed: 12/28/2014 9:33:00 AM By: [name] MD, FACS Entered By: [name] on 12/28/2014 09:33:00

## 2015-01-04 ENCOUNTER — Encounter: Payer: Medicaid Other | Admitting: Surgery

## 2015-01-04 DIAGNOSIS — L97421 Non-pressure chronic ulcer of left heel and midfoot limited to breakdown of skin: Secondary | ICD-10-CM | POA: Diagnosis not present

## 2015-01-04 NOTE — Progress Notes (Addendum)
AKYIA, BORELLI ([id number]) Visit Report for 01/04/2015 Arrival Information Details Patient Name: [name], [name] Date of Service: 01/04/2015 11:30 AM Medical Record Number: [id number] Patient Account Number: [id number] Date of Birth/Sex: 31-Mar-1976 (39 y.o. Female) Treating RN: Afful, RN, BSN, [place] Sink Primary Care Physician: [name] Other Clinician: Referring Physician: [name] Treating Physician/Extender: [name] in Treatment: 5 Visit Information History Since Last Visit Any new allergies or adverse reactions: No Patient Arrived: Ambulatory Had a fall or experienced change in No Arrival Time: 11:29 activities of daily living that may affect Accompanied By: SELF risk of falls: Transfer Assistance: None Signs or symptoms of abuse/neglect since last No Patient Identification Verified: Yes visito Secondary Verification Process Yes Hospitalized since last visit: No Completed: Has Dressing in Place as Prescribed: Yes Patient Has Alerts: Yes Pain Present Now: No Patient Alerts: Type II Diabetic 11/30/2014 ABI: (L) 1.24 (R) 0.92 Electronic Signature(s) Signed: 01/04/2015 11:29:47 AM By: Elpidio Eric BSN, RN Entered By: Elpidio Eric on 01/04/2015 11:29:47 Quinlivan, [name] -------------------------------------------------------------------------------- Encounter Discharge Information Details Patient Name: [name], [name] Date of Service: 01/04/2015 11:30 AM Medical Record Number: [id number] Patient Account Number: [id number] Date of Birth/Sex: 06/09/1976 (39 y.o. Female) Treating RN: Clover Mealy, RN, BSN, [place] Sink Primary Care Physician: [name] Other Clinician: Referring Physician: [name] Treating Physician/Extender: [name] in Treatment: 5 Encounter Discharge Information Items Discharge Pain Level: 0 Discharge Condition: Stable Ambulatory Status: Ambulatory Discharge Destination:  Home Private Transportation: Auto Accompanied By: self Schedule Follow-up Appointment: No Medication Reconciliation completed and No provided to Patient/Care Teva Bronkema: Clinical Summary of Care: Electronic Signature(s) Signed: 01/04/2015 5:42:40 PM By: Elpidio Eric BSN, RN Entered By: Elpidio Eric on 01/04/2015 12:04:06 [name] -------------------------------------------------------------------------------- Lower Extremity Assessment Details Patient Name: [name], [name] Date of Service: 01/04/2015 11:30 AM Medical Record Number: [id number] Patient Account Number: [id number] Date of Birth/Sex: [birth year] (39 y.o. Female) Treating RN: Afful, RN, BSN, [place] Sink Primary Care Physician: [name] Other Clinician: Referring Physician: [name] Treating Physician/Extender: [name] in Treatment: 5 Vascular Assessment Pulses: Posterior Tibial Dorsalis Pedis Palpable: [Right:Yes] Extremity colors, hair growth, and conditions: Extremity Color: [Right:Normal] Hair Growth on Extremity: [Right:No] Temperature of Extremity: [Right:Warm] Capillary Refill: [Right:< 3 seconds] Toe Nail Assessment Left: Right: Thick: Yes Discolored: No Deformed: No Improper Length and Hygiene: No Electronic Signature(s) Signed: 01/04/2015 11:33:07 AM By: Elpidio Eric BSN, RN Entered By: Elpidio Eric on 01/04/2015 11:33:06 [name] -------------------------------------------------------------------------------- Multi Wound Chart Details Patient Name: [name], [name] Date of Service: 01/04/2015 11:30 AM Medical Record Number: [id number] Patient Account Number: [id number] Date of Birth/Sex: [birth year] (39 y.o. Female) Treating RN: Clover Mealy, RN, BSN, [place] Sink Primary Care Physician: [name] Other Clinician: Referring Physician: [name] Treating Physician/Extender: [name] in Treatment: 5 Vital Signs Height(in):  67 Pulse(bpm): 67 Weight(lbs): 290 Blood Pressure 121/53 (mmHg): Body Mass Index(BMI): 45 Temperature(F): 98.6 Respiratory Rate 17 (breaths/min): Photos: [1:No Photos] [N/A:N/A] Wound Location: [1:Left Foot - Plantar, Lateral N/A] Wounding Event: [1:Gradually Appeared] [N/A:N/A] Primary Etiology: [1:Diabetic Wound/Ulcer of N/A the Lower Extremity] Comorbid History: [1:Type II Diabetes] [N/A:N/A] Date Acquired: [1:11/21/2013] [N/A:N/A] Weeks of Treatment: [1:5] [N/A:N/A] Wound Status: [1:Open] [N/A:N/A] Measurements L x W x D 1.7x1.2x0.5 [N/A:N/A] (cm) Area (cm) : [1:1.602] [N/A:N/A] Volume (cm) : [1:0.801] [N/A:N/A] % Reduction in Area: [1:28.70%] [N/A:N/A] % Reduction in Volume: 60.40% [N/A:N/A] Classification: [1:Grade 3] [N/A:N/A] Wagner Verification: [1:Abscess] [N/A:N/A] Exudate Amount: [1:Large] [N/A:N/A] Exudate Type: [1:Serosanguineous] [N/A:N/A] Exudate Color: [1:red, brown] [N/A:N/A] Foul Odor After [  1:Yes] [N/A:N/A] Cleansing: Odor Anticipated Due to No [N/A:N/A] Product Use: Wound Margin: [1:Thickened] [N/A:N/A] Granulation Amount: [1:Medium (34-66%)] [N/A:N/A] Granulation Quality: [1:Pink] [N/A:N/A] Necrotic Amount: [1:Small (1-33%)] [N/A:N/A] Exposed Structures: [N/A:N/A] Fascia: No Fat: No Tendon: No Muscle: No Joint: No Bone: No Limited to Skin Breakdown Epithelialization: Small (1-33%) N/A N/A Periwound Skin Texture: Callus: Yes N/A N/A Edema: No Excoriation: No Induration: No Crepitus: No Fluctuance: No Friable: No Rash: No Scarring: No Periwound Skin Maceration: Yes N/A N/A Moisture: Moist: Yes Dry/Scaly: No Periwound Skin Color: Atrophie Blanche: No N/A N/A Cyanosis: No Ecchymosis: No Erythema: No Hemosiderin Staining: No Mottled: No Pallor: No Rubor: No Temperature: No Abnormality N/A N/A Tenderness on No N/A N/A Palpation: Wound Preparation: Ulcer Cleansing: N/A N/A Rinsed/Irrigated with Saline Topical  Anesthetic Applied: Other: lidocaine 4% Treatment Notes Electronic Signature(s) Signed: 01/04/2015 11:38:11 AM By: Elpidio EricAfful, [name] BSN, RN Entered By: Elpidio EricAfful, [name] on 01/04/2015 11:38:11 Derosia, [name] ([id number]) -------------------------------------------------------------------------------- Multi-Disciplinary Care Plan Details Patient Name: Elliott, [name] Date of Service: 01/04/2015 11:30 AM Medical Record Number: [id number] Patient Account Number: [id number] Date of Birth/Sex: 05-May-1976 (38 y.o. Female) Treating RN: Afful, RN, BSN, [place] Sinkita Primary Care Physician: Sherrie MustacheJADALI, FAYEGH Other Clinician: Referring Physician: Sherrie MustacheJADALI, FAYEGH Treating Physician/Extender: Rudene ReBritto, Errol Weeks in Treatment: 5 Active Inactive Nutrition Nursing Diagnoses: Potential for alteratiion in Nutrition/Potential for imbalanced nutrition Goals: Patient/caregiver agrees to and verbalizes understanding of need to use nutritional supplements and/or vitamins as prescribed Date Initiated: 11/30/2014 Goal Status: Active Interventions: Assess patient nutrition upon admission and as needed per policy Notes: Orientation to the Wound Care Program Nursing Diagnoses: Knowledge deficit related to the wound healing center program Goals: Patient/caregiver will verbalize understanding of the Wound Healing Center Program Date Initiated: 11/30/2014 Goal Status: Active Interventions: Provide education on orientation to the wound center Notes: Peripheral Neuropathy Nursing Diagnoses: Potential alteration in peripheral tissue perfusion (select prior to confirmation of diagnosis) Goals: Patient/caregiver will verbalize understanding of disease process and disease management Peifer, [name] ([id number]) Date Initiated: 11/30/2014 Goal Status: Active Interventions: Assess signs and symptoms of neuropathy upon admission and as needed Notes: Wound/Skin Impairment Nursing Diagnoses: Impaired tissue  integrity Knowledge deficit related to smoking impact on wound healing Goals: Ulcer/skin breakdown will have a volume reduction of 30% by week 4 Date Initiated: 11/30/2014 Goal Status: Active Interventions: Assess ulceration(s) every visit Notes: Electronic Signature(s) Signed: 01/04/2015 11:38:00 AM By: Elpidio EricAfful, [name] BSN, RN Entered By: Elpidio EricAfful, [name] on 01/04/2015 11:38:00 Jurewicz, [name] ([id number]) -------------------------------------------------------------------------------- Pain Assessment Details Patient Name: Polanco, [name] Date of Service: 01/04/2015 11:30 AM Medical Record Number: [id number] Patient Account Number: [id number] Date of Birth/Sex: 05-May-1976 (38 y.o. Female) Treating RN: Clover MealyAfful, RN, BSN, [place] Sinkita Primary Care Physician: Sherrie MustacheJADALI, FAYEGH Other Clinician: Referring Physician: Sherrie MustacheJADALI, FAYEGH Treating Physician/Extender: Rudene ReBritto, Errol Weeks in Treatment: 5 Active Problems Location of Pain Severity and Description of Pain Patient Has Paino No Site Locations Pain Management and Medication Current Pain Management: Electronic Signature(s) Signed: 01/04/2015 11:29:53 AM By: Elpidio EricAfful, [name] BSN, RN Entered By: Elpidio EricAfful, [name] on 01/04/2015 11:29:53 Scarbro, [name] ([id number]) -------------------------------------------------------------------------------- Patient/Caregiver Education Details Patient Name: Doerr, [name] Date of Service: 01/04/2015 11:30 AM Medical Record Number: [id number] Patient Account Number: [id number] Date of Birth/Gender: 05-May-1976 (39 y.o. Female) Treating RN: Clover MealyAfful, RN, BSN, [place] Sinkita Primary Care Physician: Sherrie MustacheJADALI, FAYEGH Other Clinician: Referring Physician: Sherrie MustacheJADALI, FAYEGH Treating Physician/Extender: Rudene ReBritto, Errol Weeks in Treatment: 5 Education Assessment Education Provided To: Patient Education Topics Provided Basic Hygiene: Methods: Explain/Verbal Responses: State content correctly Offloading: Methods:  Explain/Verbal Responses:  State content correctly Welcome To The [facility]: Methods: Explain/Verbal Responses: State content correctly Wound Debridement: Methods: Explain/Verbal Responses: State content correctly Electronic Signature(s) Signed: 01/04/2015 5:42:40 PM By: Elpidio Eric BSN, RN Entered By: Elpidio Eric on 01/04/2015 12:04:43 Carrozza, [name] ([id number]) -------------------------------------------------------------------------------- Wound Assessment Details Patient Name: Apple, Illa M. Date of Service: 01/04/2015 11:30 AM Medical Record Number: [id number] Patient Account Number: [id number] Date of Birth/Sex: 1975/10/18 (38 y.o. Female) Treating RN: Afful, RN, BSN, [place] Sink Primary Care Physician: [name] Other Clinician: Referring Physician: [name] Treating Physician/Extender: [name] in Treatment: 5 Wound Status Wound Number: 1 Primary Diabetic Wound/Ulcer of the Lower Etiology: Extremity Wound Location: Left Foot - Plantar, Lateral Wound Status: Open Wounding Event: Gradually Appeared Comorbid Type II Diabetes Date Acquired: 11/21/2013 History: Weeks Of Treatment: 5 Clustered Wound: No Photos Photo Uploaded By: Elpidio Eric on 01/04/2015 17:38:57 Wound Measurements Length: (cm) 1.7 Width: (cm) 1.2 Depth: (cm) 0.5 Area: (cm) 1.602 Volume: (cm) 0.801 % Reduction in Area: 28.7% % Reduction in Volume: 60.4% Epithelialization: Small (1-33%) Tunneling: No Undermining: No Wound Description Classification: Grade 3 Wagner Verification: Abscess Wound Margin: Thickened Exudate Amount: Large Exudate Type: Serosanguineous Exudate Color: red, brown Foul Odor After Cleansing: Yes Due to Product Use: No Wound Bed Granulation Amount: Medium (34-66%) Exposed Structure Granulation Quality: Pink Fascia Exposed: No Necrotic Amount: Small (1-33%) Fat Layer Exposed: No Braziel, Magdelene M. ([id number]) Necrotic Quality:  Adherent Slough Tendon Exposed: No Muscle Exposed: No Joint Exposed: No Bone Exposed: No Limited to Skin Breakdown Periwound Skin Texture Texture Color No Abnormalities Noted: No No Abnormalities Noted: No Callus: Yes Atrophie Blanche: No Crepitus: No Cyanosis: No Excoriation: No Ecchymosis: No Fluctuance: No Erythema: No Friable: No Hemosiderin Staining: No Induration: No Mottled: No Localized Edema: No Pallor: No Rash: No Rubor: No Scarring: No Temperature / Pain Moisture Temperature: No Abnormality No Abnormalities Noted: No Dry / Scaly: No Maceration: Yes Moist: Yes Wound Preparation Ulcer Cleansing: Rinsed/Irrigated with Saline Topical Anesthetic Applied: Other: lidocaine 4%, Treatment Notes Wound #1 (Left, Lateral, Plantar Foot) 1. Cleansed with: Clean wound with Normal Saline 4. Dressing Applied: Prisma Ag 5. Secondary Dressing Applied Gauze and Kerlix/Conform 7. Secured with Tape Notes Prisma Ag, Drawtex, conform to secure. [employer] front offloading Engineering geologist) Signed: 01/04/2015 11:36:02 AM By: Elpidio Eric BSN, RN Entered By: Elpidio Eric on 01/04/2015 11:36:02 Avis, [name] ([id number]) -------------------------------------------------------------------------------- Vitals Details Patient Name: Caughlin, Ambree M. Date of Service: 01/04/2015 11:30 AM Medical Record Number: [id number] Patient Account Number: [id number] Date of Birth/Sex: [birth year] (38 y.o. Female) Treating RN: Afful, RN, BSN, [place] Sink Primary Care Physician: [name] Other Clinician: Referring Physician: [name] Treating Physician/Extender: [name] in Treatment: 5 Vital Signs Time Taken: 11:28 Temperature (F): 98.6 Height (in): 67 Pulse (bpm): 67 Weight (lbs): 290 Respiratory Rate (breaths/min): 17 Body Mass Index (BMI): 45.4 Blood Pressure (mmHg): 121/53 Reference Range: 80 - 120 mg / dl Electronic Signature(s) Signed:  01/04/2015 11:32:45 AM By: Elpidio Eric BSN, RN Entered By: Elpidio Eric on 01/04/2015 11:32:45

## 2015-01-04 NOTE — Progress Notes (Addendum)
MINETTE, MANDERS ([id number]) Visit Report for 01/04/2015 Chief Complaint Document Details Patient Name: [name], [name] 01/04/2015 11:30 Date of Service: AM Medical Record [id number] Number: Patient Account Number: [id number] Nov 08, 1975 (38 y.o. Treating RN: Clover Mealy, RN, BSN, [place] Sink Date of Birth/Sex: Female) Other Clinician: Primary Care Physician: [name] Treating Huriel Matt Referring Physician: [name] Physician/Extender: [name] in Treatment: 5 Information Obtained from: Patient Chief Complaint Patients presents for treatment of an open diabetic ulcer. The patient has had an ulcerated wound on her left lower extremity for about 2 years. Electronic Signature(s) Signed: 01/04/2015 11:54:20 AM By: [name] MD, FACS Entered By: [name] on 01/04/2015 11:54:19 Kats, [name] -------------------------------------------------------------------------------- Debridement Details Patient Name: [name], [name] 01/04/2015 11:30 Date of Service: AM Medical Record [id number] Number: Patient Account Number: [id number] 05-02-1976 (38 y.o. Treating RN: Afful, RN, BSN, [place] Sink Date of Birth/Sex: Female) Other Clinician: Primary Care Physician: [name] Treating Avraham Benish Referring Physician: [name] Physician/Extender: [name] in Treatment: 5 Debridement Performed for Wound #1 Left,Lateral,Plantar Foot Assessment: Performed By: Physician Tristan Schroeder., MD Debridement: Debridement Pre-procedure Yes Verification/Time Out Taken: Start Time: 11:45 Pain Control: Lidocaine 4% Topical Solution Level: Skin/Subcutaneous Tissue Total Area Debrided (L x 1.7 (cm) x 1.2 (cm) = 2.04 (cm) W): Tissue and other Viable, Non-Viable, Exudate, Fibrin/Slough, Subcutaneous material debrided: Instrument: Curette Bleeding: Minimum Hemostasis Achieved: Pressure End Time: 11:46 Procedural Pain: 0 Post Procedural Pain: 0 Response to  Treatment: Procedure was tolerated well Post Debridement Measurements of Total Wound Length: (cm) 1.7 Width: (cm) 1.2 Depth: (cm) 0.5 Volume: (cm) 0.801 Electronic Signature(s) Signed: 01/04/2015 11:53:48 AM By: [name] MD, FACS Signed: 01/04/2015 5:42:40 PM By: Elpidio Eric BSN, RN Previous Signature: 01/04/2015 11:47:32 AM Version By: Elpidio Eric BSN, RN Entered By: [name] on 01/04/2015 11:53:48 Sardo, [name] -------------------------------------------------------------------------------- HPI Details Patient Name: [name], [name] 01/04/2015 11:30 Date of Service: AM Medical Record [id number] Number: Patient Account Number: [id number] Aug 27, 1975 (38 y.o. Treating RN: Clover Mealy, RN, BSN, [place] Sink Date of Birth/Sex: Female) Other Clinician: Primary Care Physician: [name] Treating Donisha Hoch Referring Physician: [name] Physician/Extender: Weeks in Treatment: 5 History of Present Illness Location: left foot ulcer Quality: Patient reports No Pain. Severity: Wound is moderately severed with some exposed structure in the wound bed. Duration: Patient has had the wound for > 2 years prior to seeking treatment at the wound center Timing: for the last 2 years she has been treated at the [facility] wound care center Context: The wound appeared gradually over time Modifying Factors: Other treatment(s) tried include:in the last month she has been operated on the left foot for an abscess of that foot and was in hospital for 2 weeks. Associated Signs and Symptoms: Periwound excoriated and wound draining heavily HPI Description: 40 year old patient who is known to be a diabetic comes from the office of Dr. Dario Guardian for a ulcer on the left lower extremity and foot. She has had an ulcer on the left foot and has had previous treatment at [facility]. the patient tells [place] that she has been seen as the outpatient center for a long while and has had various tests  including MRIs done but she is not a very good historian. Most recently about a month ago she had a abscess drained from her left foot and was in hospital for about 2 weeks and was then discharged with a PICC line and IV antibioticstics which she knows may be vancomycin and Levaquin. She has never had a wound  VAC now has she had hyperbaric oxygen therapy. Past medical history significant for dyspnea, hypertension, hyperlipidemia, neuropathy, diabetes type 2 uncontrolled. She smokes cigarettes the latest blood reports are from March 2016 and show that her BMP is essentially normal her lipid panel was also normal her hemoglobin A1c was 9.4. 12/07/2014 -- Her tissue culture is back and she has grown significant amounts of Proteus mirabilis, pseudomonas aeruginosa and Staphylococcus aureus. The staph is a MSSA and all the cultures are sensitive to ciprofloxacin. I will start her on Cipro 500 mg by mouth twice a day for 14 days. Xray left foot -- IMPRESSION:No evidence of osteomyelitis. Healed fracture of the distal left third metatarsal. 12/21/2014 -- she is still awaiting clearance from her insurance company for HBOT. She was seen by Dr. Dario Guardian was working on controlling her diabetes and she continues to work on giving up smoking. She has completed her dosage of ciprofloxacin and we will be continuing with local care. 01/03/2015 -- her insurance clearance is still not in and we are awaiting this before starting her on a hyperbaric oxygen therapy. [name], [name] ([id number]) Electronic Signature(s) Signed: 01/04/2015 11:54:45 AM By: [name] MD, FACS Entered By: [name] on 01/04/2015 11:54:45 Pettijohn, [name] -------------------------------------------------------------------------------- Callus Pairing Details Patient Name: [name], [name] 01/04/2015 11:30 Date of Service: AM Medical Record [id number] Number: Patient Account Number: [id number] 1976-03-19  (38 y.o. Treating RN: Clover Mealy, RN, BSN, [place] Sink Date of Birth/Sex: Female) Other Clinician: Primary Care Physician: [name] Treating Tejasvi Brissett Referring Physician: [name] Physician/Extender: [name] in Treatment: 5 Procedure Performed for: Wound #1 Left,Lateral,Plantar Foot Performed By: Physician Tristan Schroeder., MD Notes the surrounding callus is gotten quite thick again and I am sharply debriding it with a #3 curette. Electronic Signature(s) Signed: 01/04/2015 11:54:12 AM By: [name] MD, FACS Previous Signature: 01/04/2015 11:42:30 AM Version By: Elpidio Eric BSN, RN Entered By: [name] on 01/04/2015 11:54:11 Bar, [name] ([id number]) -------------------------------------------------------------------------------- Physical Exam Details Patient Name: [name], [name] 01/04/2015 11:30 Date of Service: AM Medical Record [id number] Number: Patient Account Number: [id number] Jun 28, 1975 (38 y.o. Treating RN: Clover Mealy, RN, BSN, [place] Sink Date of Birth/Sex: Female) Other Clinician: Primary Care Physician: [name] Treating Mataio Mele Referring Physician: [name] Physician/Extender: Weeks in Treatment: 5 Constitutional . Pulse regular. Respirations normal and unlabored. Afebrile. . Eyes Nonicteric. Reactive to light. Ears, Nose, Mouth, and Throat Lips, teeth, and gums WNL.Marland Kitchen Moist mucosa without lesions . Neck supple and nontender. No palpable supraclavicular or cervical adenopathy. Normal sized without goiter. Respiratory WNL. No retractions.. Cardiovascular Pedal Pulses WNL. No clubbing, cyanosis or edema. Chest Breasts symmetical and no nipple discharge.. Breast tissue WNL, no masses, lumps, or tenderness.. Musculoskeletal Adexa without tenderness or enlargement.. Digits and nails w/o clubbing, cyanosis, infection, petechiae, ischemia, or inflammatory conditions.. Integumentary (Hair, Skin) No suspicious lesions. No crepitus or  fluctuance. No peri-wound warmth or erythema. No masses.Marland Kitchen Psychiatric Judgement and insight Intact.. No evidence of depression, anxiety, or agitation.. Notes The callus need sharp debridement and also the base of the ulcer. Electronic Signature(s) Signed: 01/04/2015 11:55:14 AM By: [name] MD, FACS Entered By: [name] on 01/04/2015 11:55:14 [name], [name] ([id number]) -------------------------------------------------------------------------------- Physician Orders Details Patient Name: [name], [name] 01/04/2015 11:30 Date of Service: AM Medical Record [id number] Number: Patient Account Number: [id number] [birth year] (38 y.o. Treating RN: Clover Mealy, RN, BSN, [place] Sink Date of Birth/Sex: Female) Other Clinician: Primary Care Physician: [name] Treating [name] Referring Physician: [name] Physician/Extender: [name] in Treatment:  5 Verbal / Phone Orders: Yes Clinician: Afful, RN, BSN, [place] Sink Read Back and Verified: Yes Diagnosis Coding Wound Cleansing Wound #1 Left,Lateral,Plantar Foot o Clean wound with Normal Saline. Anesthetic Wound #1 Left,Lateral,Plantar Foot o Topical Lidocaine 4% cream applied to wound bed prior to debridement Primary Wound Dressing Wound #1 Left,Lateral,Plantar Foot o Prisma Ag Secondary Dressing o Conform/Kerlix Wound #1 Left,Lateral,Plantar Foot o Drawtex Dressing Change Frequency Wound #1 Left,Lateral,Plantar Foot o Change dressing every other day. - unless wet, if wet change daily Follow-up Appointments Wound #1 Left,Lateral,Plantar Foot o Return Appointment in 1 week. Off-Loading Wound #1 Left,Lateral,Plantar Foot o Other: - darco front offloading shoe Hyperbaric Oxygen Therapy Wound #1 Left,Lateral,Plantar Foot o Evaluate for HBO Therapy o Indication: - wagner grade 3 Pritt, Darlyne M. ([id number]) Notes TCC next visit 44.5cm/17.6inches Electronic Signature(s) Signed: 01/04/2015  11:50:04 AM By: Elpidio Eric BSN, RN Signed: 01/04/2015 12:22:30 PM By: [name] MD, FACS Entered By: Elpidio Eric on 01/04/2015 11:50:04 Plasse, [name] ([id number]) -------------------------------------------------------------------------------- Problem List Details Patient Name: [name], [name] 01/04/2015 11:30 Date of Service: AM Medical Record [id number] Number: Patient Account Number: [id number] 10-08-1975 (38 y.o. 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 01/04/2015 11:30 Date of Service: AM Medical Record [id number] Number: Patient Account Number: [id number] [birth year] (38 y.o. Treating RN: Clover Mealy, RN, BSN, [place] Sink Date of Birth/Sex: Female) Other Clinician: Primary Care Physician: [name] Treating Irelynd Zumstein Referring Physician: [name] Physician/Extender: [name] in Treatment: 5 Subjective Chief Complaint Information obtained from  Patient Patients presents for treatment of an open diabetic ulcer. The patient has had an ulcerated wound on her left lower extremity for about 2 years. History of Present Illness (HPI) The following HPI elements were documented for the patient's wound: Location: left foot ulcer Quality: Patient reports No Pain. Severity: Wound is moderately severed with some exposed structure in the wound bed. Duration: Patient has had the wound for > 2 years prior to seeking treatment at the wound center Timing: for the last 2 years she has been treated at the [facility] wound care center Context: The wound appeared gradually over time Modifying Factors: Other treatment(s) tried include:in the last month she has been operated on the left foot for an abscess of that foot and was in hospital for 2 weeks. Associated Signs and Symptoms: Periwound excoriated and wound draining heavily 39 year old patient who is known to be a diabetic comes from the office of Dr. Dario Guardian for a ulcer on the left lower extremity and foot. She has had an ulcer on the left foot and has had previous treatment at [facility]. the patient tells [place] that she has been seen as the outpatient center for a long while and has had various tests including MRIs done but she is not a very good  historian. Most recently about a month ago she had a abscess drained from her left foot and was in hospital for about 2 weeks and was then discharged with a PICC line and IV antibioticstics which she knows may be vancomycin and Levaquin. She has never had a wound VAC now has she had hyperbaric oxygen therapy. Past medical history significant for dyspnea, hypertension, hyperlipidemia, neuropathy, diabetes type 2 uncontrolled. She smokes cigarettes the latest blood reports are from March 2016 and show that her BMP is essentially normal her lipid panel was also normal her hemoglobin A1c was 9.4. 12/07/2014 -- Her tissue culture is back and she has grown significant amounts  of Proteus mirabilis, pseudomonas aeruginosa and Staphylococcus aureus. The staph is a MSSA and all the cultures are sensitive to ciprofloxacin. I will start her on Cipro 500 mg by mouth twice a day for 14 days. Xray left foot -- IMPRESSION:No evidence of osteomyelitis. Healed fracture of the distal left [name], [name] ([id number]) third metatarsal. 12/21/2014 -- she is still awaiting clearance from her insurance company for HBOT. She was seen by Dr. Dario GuardianJadali was working on controlling her diabetes and she continues to work on giving up smoking. She has completed her dosage of ciprofloxacin and we will be continuing with local care. 01/03/2015 -- her insurance clearance is still not in and we are awaiting this before starting her on a hyperbaric oxygen therapy. Objective Constitutional Pulse regular. Respirations normal and unlabored. Afebrile. Vitals Time Taken: 11:28 AM, Height: 67 in, Weight: 290 lbs, BMI: 45.4, Temperature: 98.6 F, Pulse: 67 bpm, Respiratory Rate: 17 breaths/min, Blood Pressure: 121/53 mmHg. Eyes Nonicteric. Reactive to light. Ears, Nose, Mouth, and Throat Lips, teeth, and gums WNL.Marland Kitchen. Moist mucosa without lesions . Neck supple and nontender. No palpable supraclavicular or cervical adenopathy. Normal sized without goiter. Respiratory WNL. No retractions.. Cardiovascular Pedal Pulses WNL. No clubbing, cyanosis or edema. Chest Breasts symmetical and no nipple discharge.. Breast tissue WNL, no masses, lumps, or tenderness.. Musculoskeletal Adexa without tenderness or enlargement.. Digits and nails w/o clubbing, cyanosis, infection, petechiae, ischemia, or inflammatory conditions.Marland Kitchen. Psychiatric Judgement and insight Intact.. No evidence of depression, anxiety, or agitation.. General Notes: The callus need sharp debridement and also the base of the ulcer. [name], [name] ([id number]) Integumentary (Hair, Skin) No suspicious lesions. No crepitus or fluctuance.  No peri-wound warmth or erythema. No masses.. Wound #1 status is Open. Original cause of wound was Gradually Appeared. The wound is located on the Left,Lateral,Plantar Foot. The wound measures 1.7cm length x 1.2cm width x 0.5cm depth; 1.602cm^2 area and 0.801cm^3 volume. The wound is limited to skin breakdown. There is no tunneling or undermining noted. There is a large amount of serosanguineous drainage noted. The wound margin is thickened. There is medium (34-66%) pink granulation within the wound bed. There is a small (1-33%) amount of necrotic tissue within the wound bed including Adherent Slough. The periwound skin appearance exhibited: Callus, Maceration, Moist. The periwound skin appearance did not exhibit: Crepitus, Excoriation, Fluctuance, Friable, Induration, Localized Edema, Rash, Scarring, Dry/Scaly, Atrophie Blanche, Cyanosis, Ecchymosis, Hemosiderin Staining, Mottled, Pallor, Rubor, Erythema. Periwound temperature was noted as No Abnormality. 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 I have again taught her how to use this. We have also discussed application of a total contact cast and she is agreeable about this and we will do it early next week. I discussed with her that we are awaiting clearance from her insurance company regarding her hyperbaric oxygen therapy. Procedures Wound #1 Wound #1 is a Diabetic Wound/Ulcer of the Lower Extremity located on the Left,Lateral,Plantar Foot . There was a  Skin/Subcutaneous Tissue Debridement ([id number]) debridement with total area of 2.04 sq cm performed by Tristan Schroeder., MD. with the following instrument(s): Curette to remove Viable and Non-Viable tissue/material including Exudate, Fibrin/Slough, and Subcutaneous after achieving pain control using Lidocaine 4% Topical Solution. A time out was conducted prior to the start of the procedure. A Minimum amount of bleeding was controlled with Pressure. The procedure was tolerated well with a pain level of 0 Herren, Juline M. ([id number]) throughout and a pain level of 0 following the procedure. Post Debridement Measurements: 1.7cm length x 1.2cm width x 0.5cm depth; 0.801cm^3 volume. Wound #1 is a Diabetic Wound/Ulcer of the Lower Extremity located on the Left,Lateral,Plantar Foot . An Callus Pairing procedure was performed by Tristan Schroeder., MD. Notes: the surrounding callus is gotten quite thick again and I am sharply debriding it with a #3 curette. Plan Wound Cleansing: Wound #1 Left,Lateral,Plantar Foot: Clean wound with Normal Saline. Anesthetic: Wound #1 Left,Lateral,Plantar Foot: Topical Lidocaine 4% cream applied to wound bed prior to debridement Primary Wound Dressing: Wound #1 Left,Lateral,Plantar Foot: Prisma Ag Secondary Dressing: Conform/Kerlix Wound #1 Left,Lateral,Plantar Foot: Drawtex Dressing Change Frequency: Wound #1 Left,Lateral,Plantar Foot: Change dressing every other day. - unless wet, if wet change daily Follow-up Appointments: Wound #1 Left,Lateral,Plantar Foot: Return Appointment in 1 week. Off-Loading: Wound #1 Left,Lateral,Plantar Foot: Other: - darco front offloading shoe Hyperbaric Oxygen Therapy: Wound #1 Left,Lateral,Plantar Foot: Evaluate for HBO Therapy Indication: - wagner grade 3 General Notes: TCC next visit 44.5cm/17.6inches We will start using Prisma Ag over this wound and then continue with the felt offloading and the Darco front off  loading shoe. I have again taught her how to use this. We have also discussed application of a total contact cast and she is agreeable about this and we will do it early next week. Sulewski, [name] ([id number]) I discussed with her that we are awaiting clearance from her insurance company regarding her hyperbaric oxygen therapy. Electronic Signature(s) Signed: 01/04/2015 11:56:52 AM By: [name] MD, FACS Entered By: [name] on 01/04/2015 11:56:52 Christians, [name] ([id number]) -------------------------------------------------------------------------------- SuperBill Details Patient Name: Villafuerte, Corby M. Date of Service: 01/04/2015 Medical Record Patient Account Number: [id number] Number: Afful, RN, BSN, Treating RN: 1975-09-16 (38 y.o. [place] Sink Date of Birth/Sex: Female) Other Clinician: Primary Care Physician: [name] Treating [name] Referring Physician: [name] Physician/Extender: [name] in Treatment: 5 Diagnosis Coding ICD-10 Codes Code Description E11.621 Type 2 diabetes mellitus with foot ulcer E08.40 Diabetes mellitus due to underlying condition with diabetic neuropathy, unspecified L97.513 Non-pressure chronic ulcer of other part of right foot with necrosis of muscle F17.218 Nicotine dependence, cigarettes, with other nicotine-induced disorders E66.01 Morbid (severe) obesity due to excess calories Facility Procedures CPT4: Description Modifier Quantity Code 29528413 11042 - DEB SUBQ TISSUE 20 SQ CM/< 1 ICD-10 Description Diagnosis E08.40 Diabetes mellitus due to underlying condition with diabetic neuropathy, unspecified L97.513 Non-pressure chronic ulcer of other  part of right foot with  necrosis of muscle CPT4: 16109604 11055 - PARE BENIGN LES; SGL 1 ICD-10 Description Diagnosis L97.513 Non-pressure chronic ulcer of other part of right foot with necrosis of muscle E11.621 Type 2 diabetes mellitus with foot ulcer Physician  Procedures CPT4: Description Modifier Quantity Code 5409811 11042 - WC PHYS SUBQ TISS 20 SQ CM 1 ICD-10 Description Diagnosis E08.40 DONYELLE, ENYEART ([id number]) Electronic Signature(s) Signed: 01/04/2015 11:57:21 AM By: [name] MD, FACS Entered By: [name] on 01/04/2015 11:57:21

## 2015-01-09 ENCOUNTER — Encounter: Payer: Medicaid Other | Admitting: Surgery

## 2015-01-09 DIAGNOSIS — L97421 Non-pressure chronic ulcer of left heel and midfoot limited to breakdown of skin: Secondary | ICD-10-CM | POA: Diagnosis not present

## 2015-01-09 NOTE — Progress Notes (Addendum)
[name], [name] ([id number]) Visit Report for 01/09/2015 Arrival Information Details Patient Name: [name], [name] Date of Service: 01/09/2015 8:00 AM Medical Record Number: [id number] Patient Account Number: [id number] Date of Birth/Sex: Jun 08, 1976 (38 y.o. Female) Treating RN: Afful, RN, BSN, [place] Sinkita Primary Care Physician: Sherrie MustacheJADALI, FAYEGH Other Clinician: Referring Physician: Sherrie MustacheJADALI, FAYEGH Treating Physician/Extender: Rudene ReBritto, Errol Weeks in Treatment: 5 Visit Information History Since Last Visit Any new allergies or adverse reactions: No Patient Arrived: Ambulatory Had a fall or experienced change in No Arrival Time: 08:14 activities of daily living that may affect Accompanied By: self risk of falls: Transfer Assistance: None Signs or symptoms of abuse/neglect since last No Patient Identification Verified: Yes visito Secondary Verification Process Yes Hospitalized since last visit: No Completed: Has Dressing in Place as Prescribed: Yes Patient Has Alerts: Yes Has Footwear/Offloading in Place as Yes Patient Alerts: Type II Diabetic Prescribed: 11/30/2014 ABI: Left: Wedge (L) 1.24 (R) Shoe 0.92 Pain Present Now: No Electronic Signature(s) Signed: 01/09/2015 8:17:24 AM By: Elpidio EricAfful, [name] BSN, RN Entered By: Elpidio EricAfful, [name] on 01/09/2015 08:17:24 Zale, [name] ([id number]) -------------------------------------------------------------------------------- Encounter Discharge Information Details Patient Name: Arel, [name] Date of Service: 01/09/2015 8:00 AM Medical Record Number: [id number] Patient Account Number: [id number] Date of Birth/Sex: Jun 08, 1976 (38 y.o. Female) Treating RN: Clover MealyAfful, RN, BSN, [place] Sinkita Primary Care Physician: Sherrie MustacheJADALI, FAYEGH Other Clinician: Referring Physician: Sherrie MustacheJADALI, FAYEGH Treating Physician/Extender: Rudene ReBritto, Errol Weeks in Treatment: 5 Encounter Discharge Information Items Discharge Pain Level: 0 Discharge Condition: Stable Ambulatory  Status: Ambulatory Discharge Destination: Home Private Transportation: Auto Accompanied By: self Schedule Follow-up Appointment: No Medication Reconciliation completed and No provided to Patient/Care Taylen Osorto: Clinical Summary of Care: Electronic Signature(s) Signed: 01/09/2015 9:11:23 AM By: Elpidio EricAfful, [name] BSN, RN Entered By: Elpidio EricAfful, [name] on 01/09/2015 09:11:23 Wisinski, [name] ([id number]) -------------------------------------------------------------------------------- Lower Extremity Assessment Details Patient Name: Halliwell, [name] Date of Service: 01/09/2015 8:00 AM Medical Record Number: [id number] Patient Account Number: [id number] Date of Birth/Sex: Jun 08, 1976 (38 y.o. Female) Treating RN: Afful, RN, BSN, [place] Sinkita Primary Care Physician: Sherrie MustacheJADALI, FAYEGH Other Clinician: Referring Physician: Sherrie MustacheJADALI, FAYEGH Treating Physician/Extender: Rudene ReBritto, Errol Weeks in Treatment: 5 Vascular Assessment Pulses: Posterior Tibial Dorsalis Pedis Palpable: [Left:Yes] Extremity colors, hair growth, and conditions: Extremity Color: [Left:Normal] Hair Growth on Extremity: [Left:Yes] Temperature of Extremity: [Left:Warm] Capillary Refill: [Left:< 3 seconds] Dependent Rubor: [Left:No] Blanched when Elevated: [Left:No] Lipodermatosclerosis: [Left:No] Toe Nail Assessment Left: Right: Thick: Yes Discolored: No Deformed: No Improper Length and Hygiene: No Electronic Signature(s) Signed: 01/09/2015 8:18:44 AM By: Elpidio EricAfful, [name] BSN, RN Entered By: Elpidio EricAfful, [name] on 01/09/2015 08:18:44 Sculley, [name] ([id number]) -------------------------------------------------------------------------------- Multi Wound Chart Details Patient Name: Caravello, [name] Date of Service: 01/09/2015 8:00 AM Medical Record Number: [id number] Patient Account Number: [id number] Date of Birth/Sex: Jun 08, 1976 (38 y.o. Female) Treating RN: Huel CoventryWoody, Kim Primary Care Physician: Sherrie MustacheJADALI, FAYEGH Other Clinician: Referring  Physician: Sherrie MustacheJADALI, FAYEGH Treating Physician/Extender: Rudene ReBritto, Errol Weeks in Treatment: 5 Vital Signs Height(in): 67 Pulse(bpm): 86 Weight(lbs): 290 Blood Pressure 147/77 (mmHg): Body Mass Index(BMI): 45 Temperature(F): 98.2 Respiratory Rate 17 (breaths/min): Photos: [1:No Photos] [N/A:N/A] Wound Location: [1:Left Foot - Plantar, Lateral N/A] Wounding Event: [1:Gradually Appeared] [N/A:N/A] Primary Etiology: [1:Diabetic Wound/Ulcer of N/A the Lower Extremity] Comorbid History: [1:Type II Diabetes] [N/A:N/A] Date Acquired: [1:11/21/2013] [N/A:N/A] Weeks of Treatment: [1:5] [N/A:N/A] Wound Status: [1:Open] [N/A:N/A] Measurements L x W x D 1.7x1.1x0.4 [N/A:N/A] (cm) Area (cm) : [1:1.469] [N/A:N/A] Volume (cm) : [1:0.587] [N/A:N/A] % Reduction in Area: [1:34.60%] [N/A:N/A] % Reduction in Volume: 71.00% [N/A:N/A] Classification: [1:Grade 3] [N/A:N/A] [name] Verification: [1:Abscess] [  N/A:N/A] Exudate Amount: [1:Large] [N/A:N/A] Exudate Type: [1:Serosanguineous] [N/A:N/A] Exudate Color: [1:red, brown] [N/A:N/A] Foul Odor After [1:Yes] [N/A:N/A] Cleansing: Odor Anticipated Due to No [N/A:N/A] Product Use: Wound Margin: [1:Thickened] [N/A:N/A] Granulation Amount: [1:Medium (34-66%)] [N/A:N/A] Granulation Quality: [1:Pink] [N/A:N/A] Necrotic Amount: [1:Small (1-33%)] [N/A:N/A] Exposed Structures: [N/A:N/A] Fascia: No Fat: No Tendon: No Muscle: No Joint: No Bone: No Limited to Skin Breakdown Epithelialization: Small (1-33%) N/A N/A Periwound Skin Texture: Callus: Yes N/A N/A Edema: No Excoriation: No Induration: No Crepitus: No Fluctuance: No Friable: No Rash: No Scarring: No Periwound Skin Moist: Yes N/A N/A Moisture: Maceration: No Dry/Scaly: No Periwound Skin Color: Atrophie Blanche: No N/A N/A Cyanosis: No Ecchymosis: No Erythema: No Hemosiderin Staining: No Mottled: No Pallor: No Rubor: No Temperature: No Abnormality N/A N/A Tenderness on No  N/A N/A Palpation: Wound Preparation: Ulcer Cleansing: N/A N/A Rinsed/Irrigated with Saline Topical Anesthetic Applied: Other: lidocaine 4% Treatment Notes Electronic Signature(s) Signed: 01/09/2015 2:14:46 PM By: Elliot Gurney, RN, BSN, Kim RN, BSN Entered By: Elliot Gurney, RN, BSN, Kim on 01/09/2015 08:27:56 Ellington, [name] ([id number]) -------------------------------------------------------------------------------- Multi-Disciplinary Care Plan Details Patient Name: [name], [name] MULLINAX. Date of Service: 01/09/2015 8:00 AM Medical Record Number: [id number] Patient Account Number: [id number] Date of Birth/Sex: [birth year] (38 y.o. Female) Treating RN: Huel Coventry Primary Care Physician: [name] Other Clinician: Referring Physician: [name] Treating Physician/Extender: [name] in Treatment: 5 Active Inactive Nutrition Nursing Diagnoses: Potential for alteratiion in Nutrition/Potential for imbalanced nutrition Goals: Patient/caregiver agrees to and verbalizes understanding of need to use nutritional supplements and/or vitamins as prescribed Date Initiated: 11/30/2014 Goal Status: Active Interventions: Assess patient nutrition upon admission and as needed per policy Notes: Orientation to the Wound Care Program Nursing Diagnoses: Knowledge deficit related to the wound healing center program Goals: Patient/caregiver will verbalize understanding of the Wound Healing Center Program Date Initiated: 11/30/2014 Goal Status: Active Interventions: Provide education on orientation to the wound center Notes: Peripheral Neuropathy Nursing Diagnoses: Potential alteration in peripheral tissue perfusion (select prior to confirmation of diagnosis) Goals: Patient/caregiver will verbalize understanding of disease process and disease management Mesler, [name] RANKER. ([id number]) Date Initiated: 11/30/2014 Goal Status: Active Interventions: Assess signs and symptoms of neuropathy  upon admission and as needed Notes: Wound/Skin Impairment Nursing Diagnoses: Impaired tissue integrity Knowledge deficit related to smoking impact on wound healing Goals: Ulcer/skin breakdown will have a volume reduction of 30% by week 4 Date Initiated: 11/30/2014 Goal Status: Active Interventions: Assess ulceration(s) every visit Notes: Electronic Signature(s) Signed: 01/09/2015 2:14:46 PM By: Elliot Gurney, RN, BSN, Kim RN, BSN Entered By: Elliot Gurney, RN, BSN, Kim on 01/09/2015 08:27:49 Thurlow, [name] ([id number]) -------------------------------------------------------------------------------- Pain Assessment Details Patient Name: Pfiffner, [name] Date of Service: 01/09/2015 8:00 AM Medical Record Number: [id number] Patient Account Number: [id number] Date of Birth/Sex: June 09, 1976 (38 y.o. Female) Treating RN: Clover Mealy, RN, BSN, [place] Sink Primary Care Physician: [name] Other Clinician: Referring Physician: [name] Treating Physician/Extender: [name] in Treatment: 5 Active Problems Location of Pain Severity and Description of Pain Patient Has Paino No Site Locations Pain Management and Medication Current Pain Management: Electronic Signature(s) Signed: 01/09/2015 8:17:30 AM By: Elpidio Eric BSN, RN Entered By: Elpidio Eric on 01/09/2015 08:17:30 Goodwyn, [name] ([id number]) -------------------------------------------------------------------------------- Patient/Caregiver Education Details Patient Name: Brutus, [name]. Date of Service: 01/09/2015 8:00 AM Medical Record Number: [id number] Patient Account Number: [id number] Date of Birth/Gender: [birth year] (39 y.o. Female) Treating RN: Afful, RN, BSN, [place] Sink Primary Care Physician: [name] Other Clinician: Referring Physician: [name] Treating Physician/Extender: [name] in Treatment:  5 Education Assessment Education Provided To: Patient Education Topics  Provided Offloading: Methods: Explain/Verbal Responses: State content correctly Welcome To The Wound Care Center: Methods: Explain/Verbal Responses: State content correctly Electronic Signature(s) Signed: 01/09/2015 9:11:40 AM By: Elpidio Eric BSN, RN Entered By: Elpidio Eric on 01/09/2015 09:11:40 Vanaman, [name] ([id number]) -------------------------------------------------------------------------------- Wound Assessment Details Patient Name: Plessinger, [name] Date of Service: 01/09/2015 8:00 AM Medical Record Number: [id number] Patient Account Number: [id number] Date of Birth/Sex: [birth year] (38 y.o. Female) Treating RN: Afful, RN, BSN, [place] Sink Primary Care Physician: [name] Other Clinician: Referring Physician: [name] Treating Physician/Extender: [name] in Treatment: 5 Wound Status Wound Number: 1 Primary Diabetic Wound/Ulcer of the Lower Etiology: Extremity Wound Location: Left Foot - Plantar, Lateral Wound Status: Open Wounding Event: Gradually Appeared Comorbid Type II Diabetes Date Acquired: 11/21/2013 History: Weeks Of Treatment: 5 Clustered Wound: No Photos Photo Uploaded By: Elpidio Eric on 01/09/2015 12:21:05 Wound Measurements Length: (cm) 1.7 Width: (cm) 1.1 Depth: (cm) 0.4 Area: (cm) 1.469 Volume: (cm) 0.587 % Reduction in Area: 34.6% % Reduction in Volume: 71% Epithelialization: Small (1-33%) Tunneling: No Undermining: No Wound Description Classification: Grade 3 [place] Verification: Abscess Wound Margin: Thickened Exudate Amount: Large Exudate Type: Serosanguineous Exudate Color: red, brown Foul Odor After Cleansing: Yes Due to Product Use: No Wound Bed Granulation Amount: Medium (34-66%) Exposed Structure Granulation Quality: Pink Fascia Exposed: No Necrotic Amount: Small (1-33%) Fat Layer Exposed: No Bazar, [name] ([id number]) Necrotic Quality: Adherent Slough Tendon Exposed: No Muscle Exposed:  No Joint Exposed: No Bone Exposed: No Limited to Skin Breakdown Periwound Skin Texture Texture Color No Abnormalities Noted: No No Abnormalities Noted: No Callus: Yes Atrophie Blanche: No Crepitus: No Cyanosis: No Excoriation: No Ecchymosis: No Fluctuance: No Erythema: No Friable: No Hemosiderin Staining: No Induration: No Mottled: No Localized Edema: No Pallor: No Rash: No Rubor: No Scarring: No Temperature / Pain Moisture Temperature: No Abnormality No Abnormalities Noted: No Dry / Scaly: No Maceration: No Moist: Yes Wound Preparation Ulcer Cleansing: Rinsed/Irrigated with Saline Topical Anesthetic Applied: Other: lidocaine 4%, Treatment Notes Wound #1 (Left, Lateral, Plantar Foot) 4. Dressing Applied: Prisma Ag 5. Secondary Dressing Applied Foam 7. Secured with Tape Notes Prisma Ag . TCC applied by MD Electronic Signature(s) Signed: 01/09/2015 8:19:40 AM By: Elpidio Eric BSN, RN Entered By: Elpidio Eric on 01/09/2015 08:19:40 Hipp, [name] ([id number]) -------------------------------------------------------------------------------- Vitals Details Patient Name: Smaltz, [name] Date of Service: 01/09/2015 8:00 AM Medical Record Number: [id number] Patient Account Number: [id number] Date of Birth/Sex: 04-10-1976 (38 y.o. Female) Treating RN: Afful, RN, BSN, [place] Sink Primary Care Physician: [name] Other Clinician: Referring Physician: [name] Treating Physician/Extender: [name] in Treatment: 5 Vital Signs Time Taken: 08:16 Temperature (F): 98.2 Height (in): 67 Pulse (bpm): 86 Weight (lbs): 290 Respiratory Rate (breaths/min): 17 Body Mass Index (BMI): 45.4 Blood Pressure (mmHg): 147/77 Reference Range: 80 - 120 mg / dl Electronic Signature(s) Signed: 01/09/2015 8:17:53 AM By: Elpidio Eric BSN, RN Entered By: Elpidio Eric on 01/09/2015 08:17:53

## 2015-01-09 NOTE — Progress Notes (Signed)
SHALVA, [name] ([id number]) Visit Report for 01/09/2015 Chief Complaint Document Details Patient Name: [name], [name] Date of Service: 01/09/2015 8:00 AM Medical Record Number: [id number] Patient Account Number: [id number] Date of Birth/Sex: 01-Aug-1975 (39 y.o. Female) Treating RN: Primary Care Physician: [name] Other Clinician: Referring Physician: [name] Treating Physician/Extender: [name] in Treatment: 5 Information Obtained from: Patient Chief Complaint Patients presents for treatment of an open diabetic ulcer. The patient has had an ulcerated wound on her left lower extremity for about 2 years. Electronic Signature(s) Signed: 01/09/2015 8:34:54 AM By: [name] MD, FACS Entered By: [name] on 01/09/2015 08:34:54 Nerio, [name] ([id number]) -------------------------------------------------------------------------------- Debridement Details Patient Name: [name], [name] Date of Service: 01/09/2015 8:00 AM Medical Record Number: [id number] Patient Account Number: [id number] Date of Birth/Sex: 1975/11/05 (39 y.o. Female) Treating RN: Primary Care Physician: [name] Other Clinician: Referring Physician: [name] Treating Physician/Extender: [name] in Treatment: 5 Debridement Performed for Wound #1 Left,Lateral,Plantar Foot Assessment: Performed By: Physician Tristan Schroeder., MD Debridement: Debridement Pre-procedure Yes Verification/Time Out Taken: Start Time: 08:26 Pain Control: Other : lidocaine 4% Level: Skin/Subcutaneous Tissue Total Area Debrided (L x 1.7 (cm) x 1.1 (cm) = 1.87 (cm) W): Tissue and other Viable, Non-Viable, Callus, Fibrin/Slough, Subcutaneous material debrided: Instrument: Curette Bleeding: Minimum Hemostasis Achieved: Silver Nitrate End Time: 08:30 Procedural Pain: 0 Post Procedural Pain: 0 Response to Treatment: Procedure was tolerated well Post Debridement  Measurements of Total Wound Length: (cm) 1.7 Width: (cm) 1.1 Depth: (cm) 0.4 Volume: (cm) 0.587 Electronic Signature(s) Signed: 01/09/2015 8:34:49 AM By: [name] MD, FACS Entered By: [name] on 01/09/2015 08:34:49 Gillie, [name] ([id number]) -------------------------------------------------------------------------------- HPI Details Patient Name: [name], [name] Date of Service: 01/09/2015 8:00 AM Medical Record Number: [id number] Patient Account Number: [id number] Date of Birth/Sex: [birth year] (39 y.o. Female) Treating RN: Primary Care Physician: [name] Other Clinician: Referring Physician: [name] Treating Physician/Extender: [name] in Treatment: 5 History of Present Illness Location: left foot ulcer Quality: Patient reports No Pain. Severity: Wound is moderately severed with some exposed structure in the wound bed. Duration: Patient has had the wound for > 2 years prior to seeking treatment at the wound center Timing: for the last 2 years she has been treated at the [facility] wound care center Context: The wound appeared gradually over time Modifying Factors: Other treatment(s) tried include:in the last month she has been operated on the left foot for an abscess of that foot and was in hospital for 2 weeks. Associated Signs and Symptoms: Periwound excoriated and wound draining heavily HPI Description: 39 year old patient who is known to be a diabetic comes from the office of Dr. Dario Guardian for a ulcer on the left lower extremity and foot. She has had an ulcer on the left foot and has had previous treatment at [facility]. the patient tells [place] that she has been seen as the outpatient center for a long while and has had various tests including MRIs done but she is not a very good historian. Most recently about a month ago she had a abscess drained from her left foot and was in hospital for about 2 weeks and was then discharged with a PICC line and IV  antibioticstics which she knows may be vancomycin and Levaquin. She has never had a wound VAC now has she had hyperbaric oxygen therapy. Past medical history significant for dyspnea, hypertension, hyperlipidemia, neuropathy, diabetes type 2 uncontrolled. She smokes cigarettes the latest blood reports are from March  2016 and show that her BMP is essentially normal her lipid panel was also normal her hemoglobin A1c was 9.4. 12/07/2014 -- Her tissue culture is back and she has grown significant amounts of Proteus mirabilis, pseudomonas aeruginosa and Staphylococcus aureus. The staph is a MSSA and all the cultures are sensitive to ciprofloxacin. I will start her on Cipro 500 mg by mouth twice a day for 14 days. Xray left foot -- IMPRESSION:No evidence of osteomyelitis. Healed fracture of the distal left third metatarsal. 12/21/2014 -- she is still awaiting clearance from her insurance company for HBOT. She was seen by Dr. Dario Guardian was working on controlling her diabetes and she continues to work on giving up smoking. She has completed her dosage of ciprofloxacin and we will be continuing with local care. 01/03/2015 -- her insurance clearance is still not in and we are awaiting this before starting her on a hyperbaric oxygen therapy. 01/09/2015 - we are still awaiting her insurance clearance and other than that she is doing fine. Electronic Signature(s) Boettcher, SANARI OFFNER ([id number]) Signed: 01/09/2015 8:35:18 AM By: [name] MD, FACS Entered By: [name] on 01/09/2015 08:35:18 Rodino, [name] ([id number]) -------------------------------------------------------------------------------- Physical Exam Details Patient Name: [name], [name] Date of Service: 01/09/2015 8:00 AM Medical Record Number: [id number] Patient Account Number: [id number] Date of Birth/Sex: [birth year] (39 y.o. Female) Treating RN: Primary Care Physician: [name] Other Clinician: Referring Physician:  [name] Treating Physician/Extender: [name] in Treatment: 5 Constitutional . Pulse regular. Respirations normal and unlabored. Afebrile. . Eyes Nonicteric. Reactive to light. Ears, Nose, Mouth, and Throat Lips, teeth, and gums WNL.Marland Kitchen Moist mucosa without lesions . Neck supple and nontender. No palpable supraclavicular or cervical adenopathy. Normal sized without goiter. Respiratory WNL. No retractions.. Cardiovascular Pedal Pulses WNL. No clubbing, cyanosis or edema. Musculoskeletal Adexa without tenderness or enlargement.. Digits and nails w/o clubbing, cyanosis, infection, petechiae, ischemia, or inflammatory conditions.. Integumentary (Hair, Skin) No suspicious lesions. No crepitus or fluctuance. No peri-wound warmth or erythema. No masses.Marland Kitchen Psychiatric Judgement and insight Intact.. No evidence of depression, anxiety, or agitation.. Notes she has still form some callus and after sharp debridement of the callus and freshening the subcutaneous tissue and granulation with a curette. Electronic Signature(s) Signed: 01/09/2015 8:35:52 AM By: [name] MD, FACS Entered By: [name] on 01/09/2015 08:35:52 Sheen, [name] ([id number]) -------------------------------------------------------------------------------- Physician Orders Details Patient Name: Pickerill, Brinnley M. Date of Service: 01/09/2015 8:00 AM Medical Record Number: [id number] Patient Account Number: [id number] Date of Birth/Sex: 04/18/1976 (38 y.o. Female) Treating RN: Huel Coventry Primary Care Physician: [name] Other Clinician: Referring Physician: [name] Treating Physician/Extender: [name] in Treatment: 5 Verbal / Phone Orders: Yes Clinician: Huel Coventry Read Back and Verified: Yes Diagnosis Coding Wound Cleansing Wound #1 Left,Lateral,Plantar Foot o Clean wound with Normal Saline. Anesthetic Wound #1 Left,Lateral,Plantar Foot o Topical Lidocaine  4% cream applied to wound bed prior to debridement Primary Wound Dressing Wound #1 Left,Lateral,Plantar Foot o Prisma Ag - double layer Secondary Dressing o Conform/Kerlix Wound #1 Left,Lateral,Plantar Foot o Drawtex Dressing Change Frequency Wound #1 Left,Lateral,Plantar Foot o Other: - Thursday Follow-up Appointments Wound #1 Left,Lateral,Plantar Foot o Return Appointment in 1 week. o Other: - Thursday for cast change Off-Loading Wound #1 Left,Lateral,Plantar Foot o Total Contact Cast to Left Lower Extremity Hyperbaric Oxygen Therapy Wound #1 Left,Lateral,Plantar Foot o Evaluate for HBO Therapy o Indication: - wagner grade 3 Khawaja, [name] ([id number]) Electronic Signature(s) Signed: 01/09/2015 12:11:50 PM By: [name] MD, FACS Signed:  01/09/2015 2:14:46 PM By: Elliot Gurney, RN, BSN, Kim RN, BSN Entered By: Elliot Gurney, RN, BSN, Kim on 01/09/2015 08:31:37 Cragin, [name] ([id number]) -------------------------------------------------------------------------------- Problem List Details Patient Name: Ewer, Journi M. Date of Service: 01/09/2015 8:00 AM Medical Record Number: [id number] Patient Account Number: [id number] Date of Birth/Sex: [birth year] (38 y.o. 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 Date of Service: 01/09/2015 8:00 AM Medical Record Number: [id number] Patient Account Number: [id number] Date of Birth/Sex: 1975-08-23 (38 y.o. Female) Treating RN: Primary Care Physician: [name] Other Clinician: Referring Physician: [name] Treating Physician/Extender: [name] in Treatment: 5 Subjective Chief Complaint Information obtained from Patient Patients presents for treatment of an open diabetic ulcer. The patient has had an ulcerated wound on her left lower extremity for about 2 years. History of Present Illness (HPI) The following HPI elements were documented for the patient's wound: Location: left foot ulcer Quality: Patient reports No Pain. Severity: Wound is moderately severed with some exposed structure in the wound bed. Duration: Patient has had the wound for > 2 years prior to seeking treatment at the wound center Timing: for the last 2 years she has been treated at the [facility] wound care center Context: The wound appeared gradually over time Modifying Factors: Other treatment(s) tried include:in the last month she has been operated on the left foot for an abscess of that foot and was in hospital for 2 weeks. Associated Signs and Symptoms: Periwound excoriated and wound draining heavily 39 year old patient who is known to be a diabetic comes from the office of Dr. Dario Guardian for a ulcer on the left lower extremity and foot. She has had an ulcer on the left foot and has had previous treatment at [facility]. the patient tells [place] that she has been seen as the outpatient center for a long while and has had various tests including MRIs  done but she is not a very good historian. Most recently about a month ago she had a abscess drained from her left foot and was in hospital for about 2 weeks and was then discharged with a PICC line and IV antibioticstics which she knows may be vancomycin and Levaquin. She has never had a wound VAC now has she had hyperbaric oxygen therapy. Past medical history significant for dyspnea, hypertension, hyperlipidemia, neuropathy, diabetes type 2 uncontrolled. She smokes cigarettes the latest blood reports are from March 2016 and show that her BMP is essentially normal her lipid panel was also normal her hemoglobin A1c was 9.4. 12/07/2014 -- Her tissue culture is back and she has grown significant amounts of Proteus mirabilis, pseudomonas aeruginosa and Staphylococcus aureus. The staph is a MSSA and all the cultures are sensitive to ciprofloxacin. I will start her on Cipro 500 mg by mouth twice a day for 14 days. Xray left foot --  IMPRESSION:No evidence of osteomyelitis. Healed fracture of the distal left third metatarsal. Hollenback, [name] ([id number]) 12/21/2014 -- she is still awaiting clearance from her insurance company for HBOT. She was seen by Dr. Dario Guardian was working on controlling her diabetes and she continues to work on giving up smoking. She has completed her dosage of ciprofloxacin and we will be continuing with local care. 01/03/2015 -- her insurance clearance is still not in and we are awaiting this before starting her on a hyperbaric oxygen therapy. 01/09/2015 - we are still awaiting her insurance clearance and other than that she is doing fine. Objective Constitutional Pulse regular. Respirations normal and unlabored. Afebrile. Vitals Time Taken: 8:16 AM, Height: 67 in, Weight: 290 lbs, BMI: 45.4, Temperature: 98.2 F, Pulse: 86 bpm, Respiratory Rate: 17 breaths/min, Blood Pressure: 147/77 mmHg. Eyes Nonicteric. Reactive to light. Ears, Nose, Mouth, and Throat Lips, teeth,  and gums WNL.Marland Kitchen Moist mucosa without lesions . Neck supple and nontender. No palpable supraclavicular or cervical adenopathy. Normal sized without goiter. Respiratory WNL. No retractions.. Cardiovascular Pedal Pulses WNL. No clubbing, cyanosis or edema. Musculoskeletal Adexa without tenderness or enlargement.. Digits and nails w/o clubbing, cyanosis, infection, petechiae, ischemia, or inflammatory conditions.Marland Kitchen Psychiatric Judgement and insight Intact.. No evidence of depression, anxiety, or agitation.. General Notes: she has still form some callus and after sharp debridement of the callus and freshening the subcutaneous tissue and granulation with a curette. Integumentary (Hair, Skin) No suspicious lesions. No crepitus or fluctuance. No peri-wound warmth or erythema. No masses.Marland Kitchen Shealy, [name] ([id number]) Wound #1 status is Open. Original cause of wound was Gradually Appeared. The wound is located on the Left,Lateral,Plantar Foot. The wound measures 1.7cm length x 1.1cm width x 0.4cm depth; 1.469cm^2 area and 0.587cm^3 volume. The wound is limited to skin breakdown. There is no tunneling or undermining noted. There is a large amount of serosanguineous drainage noted. The wound margin is thickened. There is medium (34-66%) pink granulation within the wound bed. There is a small (1-33%) amount of necrotic tissue within the wound bed including Adherent Slough. The periwound skin appearance exhibited: Callus, Moist. The periwound skin appearance did not exhibit: Crepitus, Excoriation, Fluctuance, Friable, Induration, Localized Edema, Rash, Scarring, Dry/Scaly, Maceration, Atrophie Blanche, Cyanosis, Ecchymosis, Hemosiderin Staining, Mottled, Pallor, Rubor, Erythema. Periwound temperature was noted as No Abnormality. 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 She will come on Thursday for a cast recheck and then follow-up every week on Thursday. Her insurance coverage for hyperbaric oxygen is still stalled and we are awaiting this clearance. She is working on giving up smoking completely. We shall see her back appropriately. Procedures Wound #1 Wound #1 is a Diabetic Wound/Ulcer of the Lower Extremity located on the Left,Lateral,Plantar Foot . There was a Skin/Subcutaneous Tissue Debridement ([id number]) debridement with total area of 1.87 sq cm performed by Kadasia Kassing, Ignacia Felling., MD. with the following instrument(s): Curette to remove Viable and Non-Viable tissue/material including Fibrin/Slough, Callus, and Subcutaneous after achieving pain control using Other (lidocaine 4%). A time out was conducted prior to the start of the procedure. A Minimum amount of bleeding was  controlled with Silver Nitrate. The procedure was tolerated well with a pain level of 0 throughout and a pain level of 0 following the procedure. Post Debridement Measurements: 1.7cm length x 1.1cm width x 0.4cm depth; 0.587cm^3 volume. Uhls, [name] ([id number]) Plan Wound Cleansing: Wound #1 Left,Lateral,Plantar Foot: Clean wound with Normal Saline. Anesthetic: Wound #1 Left,Lateral,Plantar Foot: Topical Lidocaine 4% cream applied to wound bed prior to debridement Primary Wound Dressing: Wound #1 Left,Lateral,Plantar Foot: Prisma Ag - double layer Secondary Dressing: Conform/Kerlix Wound #1 Left,Lateral,Plantar Foot: Drawtex Dressing Change Frequency: Wound #1 Left,Lateral,Plantar Foot: Other: - Thursday Follow-up Appointments: Wound #1  Left,Lateral,Plantar Foot: Return Appointment in 1 week. Other: - Thursday for cast change Off-Loading: Wound #1 Left,Lateral,Plantar Foot: Total Contact Cast to Left Lower Extremity Hyperbaric Oxygen Therapy: Wound #1 Left,Lateral,Plantar Foot: Evaluate for HBO Therapy Indication: - wagner grade 3 Today after cleaning out the wound well we are going to apply Prisma and a total contact cast. She will come on Thursday for a cast recheck and then follow-up every week on Thursday. Her insurance coverage for hyperbaric oxygen is still stalled and we are awaiting this clearance. She is working on giving up smoking completely. We shall see her back appropriately. Electronic Signature(s) Signed: 01/09/2015 8:36:58 AM By: Evlyn KannerBritto, Makynzie Dobesh MD, FACS Entered By: Evlyn KannerBritto, Eduar Kumpf on 01/09/2015 08:36:58 Jawad, [name] ([id number]) Manthe, [name] ([id number]) -------------------------------------------------------------------------------- SuperBill Details Patient Name: Flemings, Flecia M. Date of Service: 01/09/2015 Medical Record Number: [id number] Patient Account Number: [id number] Date of Birth/Sex: 21-May-1976 (39 y.o. Female) Treating RN: Primary Care Physician: Sherrie MustacheJADALI, FAYEGH Other Clinician: Referring Physician: Sherrie MustacheJADALI, FAYEGH Treating Physician/Extender: Rudene ReBritto, Milica Gully Weeks in Treatment: 5 Diagnosis Coding ICD-10 Codes Code Description E11.621 Type 2 diabetes mellitus with foot ulcer E08.40 Diabetes mellitus due to underlying condition with diabetic neuropathy, unspecified L97.513 Non-pressure chronic ulcer of other part of right foot with necrosis of muscle F17.218 Nicotine dependence, cigarettes, with other nicotine-induced disorders E66.01 Morbid (severe) obesity due to excess calories Facility Procedures CPT4: Description Modifier Quantity Code [id number] 11042 - DEB SUBQ TISSUE 20 SQ CM/< 1 ICD-10 Description Diagnosis E11.621 Type 2 diabetes mellitus with foot ulcer E08.40  Diabetes mellitus due to underlying condition with diabetic neuropathy,  unspecified L97.513 Non-pressure chronic ulcer of other part of right foot with necrosis of muscle F17.218 Nicotine dependence, cigarettes, with other nicotine-induced disorders Physician Procedures CPT4: Description Modifier Quantity Code [id number] 11042 - WC PHYS SUBQ TISS 20 SQ CM 1 ICD-10 Description Diagnosis E11.621 Type 2 diabetes mellitus with foot ulcer E08.40 Diabetes mellitus due to underlying condition with diabetic neuropathy, unspecified  L97.513 Non-pressure chronic ulcer of other part of right foot with necrosis of muscle F17.218 Nicotine dependence, cigarettes, with other nicotine-induced disorders Winnie, [name] ([id number]) Electronic Signature(s) Signed: 01/09/2015 8:37:14 AM By: Evlyn KannerBritto, Ilah Boule MD, FACS Entered By: Evlyn KannerBritto, Allisa Einspahr on 01/09/2015 08:37:14

## 2015-01-11 ENCOUNTER — Encounter: Payer: Medicaid Other | Admitting: Surgery

## 2015-01-11 DIAGNOSIS — L97421 Non-pressure chronic ulcer of left heel and midfoot limited to breakdown of skin: Secondary | ICD-10-CM | POA: Diagnosis not present

## 2015-01-11 NOTE — Progress Notes (Addendum)
KADY, TOOTHAKER ([id number]) Visit Report for 01/11/2015 Chief Complaint Document Details Patient Name: [name], [name] Date of Service: 01/11/2015 1:45 PM Medical Record Number: [id number] Patient Account Number: [id number] Date of Birth/Sex: 1976/04/21 (39 y.o. Female) Treating RN: Primary Care Physician: [name] Other Clinician: Referring Physician: [name] Treating Physician/Extender: [name] in Treatment: 6 Information Obtained from: Patient Chief Complaint Patients presents for treatment of an open diabetic ulcer. The patient has had an ulcerated wound on her left lower extremity for about 2 years. Electronic Signature(s) Signed: 01/11/2015 2:17:51 PM By: [name] MD, FACS Entered By: [name] on 01/11/2015 14:17:51 Hogue, [name] ([id number]) -------------------------------------------------------------------------------- HPI Details Patient Name: Escudero, Stormie M. Date of Service: 01/11/2015 1:45 PM Medical Record Number: [id number] Patient Account Number: [id number] Date of Birth/Sex: 04-03-1976 (39 y.o. Female) Treating RN: Primary Care Physician: [name] Other Clinician: Referring Physician: [name] Treating Physician/Extender: [name] in Treatment: 6 History of Present Illness Location: left foot ulcer Quality: Patient reports No Pain. Severity: Wound is moderately severed with some exposed structure in the wound bed. Duration: Patient has had the wound for > 2 years prior to seeking treatment at the wound center Timing: for the last 2 years she has been treated at the [facility] wound care center Context: The wound appeared gradually over time Modifying Factors: Other treatment(s) tried include:in the last month she has been operated on the left foot for an abscess of that foot and was in hospital for 2 weeks. Associated Signs and Symptoms: Periwound excoriated and wound draining heavily HPI  Description: 39 year old patient who is known to be a diabetic comes from the office of Dr. Dario Guardian for a ulcer on the left lower extremity and foot. She has had an ulcer on the left foot and has had previous treatment at [facility]. the patient tells [place] that she has been seen as the outpatient center for a long while and has had various tests including MRIs done but she is not a very good historian. Most recently about a month ago she had a abscess drained from her left foot and was in hospital for about 2 weeks and was then discharged with a PICC line and IV antibioticstics which she knows may be vancomycin and Levaquin. She has never had a wound VAC now has she had hyperbaric oxygen therapy. Past medical history significant for dyspnea, hypertension, hyperlipidemia, neuropathy, diabetes type 2 uncontrolled. She smokes cigarettes the latest blood reports are from March 2016 and show that her BMP is essentially normal her lipid panel was also normal her hemoglobin A1c was 9.4. 12/07/2014 -- Her tissue culture is back and she has grown significant amounts of Proteus mirabilis, pseudomonas aeruginosa and Staphylococcus aureus. The staph is a MSSA and all the cultures are sensitive to ciprofloxacin. I will start her on Cipro 500 mg by mouth twice a day for 14 days. Xray left foot -- IMPRESSION:No evidence of osteomyelitis. Healed fracture of the distal left third metatarsal. 12/21/2014 -- she is still awaiting clearance from her insurance company for HBOT. She was seen by Dr. Dario Guardian was working on controlling her diabetes and she continues to work on giving up smoking. She has completed her dosage of ciprofloxacin and we will be continuing with local care. 01/03/2015 -- her insurance clearance is still not in and we are awaiting this before starting her on a hyperbaric oxygen therapy. 01/09/2015 - we are still awaiting her insurance clearance and other than that she is doing fine.  Electronic  Signature(s) Trnka, RINI MOFFIT ([id number]) Signed: 01/11/2015 2:17:56 PM By: [name] MD, FACS Entered By: [name] on 01/11/2015 14:17:56 Tuggle, [name] ([id number]) -------------------------------------------------------------------------------- Physical Exam Details Patient Name: Mcclay, Voncille M. Date of Service: 01/11/2015 1:45 PM Medical Record Number: [id number] Patient Account Number: [id number] Date of Birth/Sex: [birth year] (39 y.o. Female) Treating RN: Primary Care Physician: [name] Other Clinician: Referring Physician: [name] Treating Physician/Extender: [name] in Treatment: 6 Constitutional . Pulse regular. Respirations normal and unlabored. Afebrile. . Eyes Nonicteric. Reactive to light. Ears, Nose, Mouth, and Throat Lips, teeth, and gums WNL.Marland Kitchen Moist mucosa without lesions . Neck supple and nontender. No palpable supraclavicular or cervical adenopathy. Normal sized without goiter. Respiratory WNL. No retractions.. Cardiovascular Pedal Pulses WNL. No clubbing, cyanosis or edema. Chest Breasts symmetical and no nipple discharge.. Breast tissue WNL, no masses, lumps, or tenderness.. Musculoskeletal Adexa without tenderness or enlargement.. Digits and nails w/o clubbing, cyanosis, infection, petechiae, ischemia, or inflammatory conditions.. Integumentary (Hair, Skin) No suspicious lesions. No crepitus or fluctuance. No peri-wound warmth or erythema. No masses.Marland Kitchen Psychiatric Judgement and insight Intact.. No evidence of depression, anxiety, or agitation.. Notes The ulcerated area looks good and there is no evidence of any drainage or cellulitis. Electronic Signature(s) Signed: 01/11/2015 2:18:36 PM By: [name] MD, FACS Entered By: [name] on 01/11/2015 14:18:36 Asaro, [name] ([id number]) -------------------------------------------------------------------------------- Physician Orders Details Patient  Name: Pawling, Susanna M. Date of Service: 01/11/2015 1:45 PM Medical Record Number: [id number] Patient Account Number: [id number] Date of Birth/Sex: [birth year] (39 y.o. Female) Treating RN: Curtis Sites Primary Care Physician: [name] Other Clinician: Referring Physician: [name] Treating Physician/Extender: [name] in Treatment: 6 Verbal / Phone Orders: Yes Clinician: Curtis Sites Read Back and Verified: Yes Diagnosis Coding ICD-10 Coding Code Description E11.621 Type 2 diabetes mellitus with foot ulcer E08.40 Diabetes mellitus due to underlying condition with diabetic neuropathy, unspecified L97.513 Non-pressure chronic ulcer of other part of right foot with necrosis of muscle F17.218 Nicotine dependence, cigarettes, with other nicotine-induced disorders E66.01 Morbid (severe) obesity due to excess calories Wound Cleansing Wound #1 Left,Lateral,Plantar Foot o Clean wound with Normal Saline. Anesthetic Wound #1 Left,Lateral,Plantar Foot o Topical Lidocaine 4% cream applied to wound bed prior to debridement Primary Wound Dressing Wound #1 Left,Lateral,Plantar Foot o Prisma Ag - double layer Secondary Dressing Wound #1 Left,Lateral,Plantar Foot o Foam o Drawtex Dressing Change Frequency Wound #1 Left,Lateral,Plantar Foot o Change dressing every week Follow-up Appointments Wound #1 Left,Lateral,Plantar Foot o Return Appointment in 1 week. Off-Loading Fitting, Yarixa M. ([id number]) Wound #1 Left,Lateral,Plantar Foot o Total Contact Cast to Left Lower Extremity Hyperbaric Oxygen Therapy Wound #1 Left,Lateral,Plantar Foot o Evaluate for HBO Therapy o Indication: - wagner grade 3 Electronic Signature(s) Signed: 01/11/2015 2:39:29 PM By: Curtis Sites Signed: 01/11/2015 4:15:37 PM By: [name] MD, FACS Entered By: Curtis Sites on 01/11/2015 14:39:29 Corzine, [name]  ([id number]) -------------------------------------------------------------------------------- Problem List Details Patient Name: Bertone, Kalyna M. Date of Service: 01/11/2015 1:45 PM Medical Record Number: [id number] Patient Account Number: [id number] Date of Birth/Sex: 29-Apr-1976 (39 y.o. 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 Date of Service: 01/11/2015 1:45 PM Medical Record Number: [id number] Patient Account Number: [id number] Date of Birth/Sex: 10-19-1975 (39 y.o. Female) Treating RN: Primary Care Physician: [name] Other Clinician: Referring Physician: [name] Treating Physician/Extender: [name] in Treatment: 6 Subjective Chief Complaint Information obtained from Patient Patients presents for treatment of an open diabetic ulcer. The patient has had an ulcerated wound on her left lower extremity for about 2 years. History of Present Illness (HPI) The following HPI elements were  documented for the patient's wound: Location: left foot ulcer Quality: Patient reports No Pain. Severity: Wound is moderately severed with some exposed structure in the wound bed. Duration: Patient has had the wound for > 2 years prior to seeking treatment at the wound center Timing: for the last 2 years she has been treated at the [facility] wound care center Context: The wound appeared gradually over time Modifying Factors: Other treatment(s) tried include:in the last month she has been operated on the left foot for an abscess of that foot and was in hospital for 2 weeks. Associated Signs and Symptoms: Periwound excoriated and wound draining heavily 39 year old patient who is known to be a diabetic comes from the office of Dr. Dario Guardian for a ulcer on the left lower extremity and foot. She has had an ulcer on the left foot and has had previous treatment at [facility]. the patient tells [place] that she has been seen as the outpatient center for a long while and has had various tests including MRIs done but she is not a very good historian. Most recently about a month ago she had a abscess drained from her left foot and was in hospital for about 2 weeks and was then discharged with a PICC line and IV antibioticstics which she knows may be vancomycin and Levaquin. She has never had a wound VAC now has she had hyperbaric oxygen therapy. Past medical history significant for dyspnea, hypertension, hyperlipidemia, neuropathy, diabetes type 2 uncontrolled. She smokes cigarettes the latest blood reports are from March 2016 and show that her BMP is essentially normal her lipid panel was also normal her hemoglobin A1c was 9.4. 12/07/2014 -- Her tissue culture is back and she has grown significant amounts of Proteus mirabilis, pseudomonas aeruginosa and Staphylococcus aureus. The staph is a MSSA and all the cultures are sensitive to ciprofloxacin. I will start her on Cipro 500 mg by mouth twice a day for 14 days. Xray  left foot -- IMPRESSION:No evidence of osteomyelitis. Healed fracture of the distal left third metatarsal. Tenbrink, [name] ([id number]) 12/21/2014 -- she is still awaiting clearance from her insurance company for HBOT. She was seen by Dr. Dario Guardian was working on controlling her diabetes and she continues to work on giving up smoking. She has completed her dosage of ciprofloxacin and we will be continuing with local care. 01/03/2015 -- her insurance clearance is still not in and we are awaiting this before starting her on a hyperbaric oxygen therapy. 01/09/2015 - we are still awaiting her insurance clearance and other than that she is doing fine. Objective Constitutional Pulse regular. Respirations normal and unlabored. Afebrile. Vitals Time Taken: 2:09 PM, Height: 67 in, Weight: 290 lbs, BMI:  45.4, Pulse: 88 bpm, Respiratory Rate: 16 breaths/min. Eyes Nonicteric. Reactive to light. Ears, Nose, Mouth, and Throat Lips, teeth, and gums WNL.Marland Kitchen Moist mucosa without lesions . Neck supple and nontender. No palpable supraclavicular or cervical adenopathy. Normal sized without goiter. Respiratory WNL. No retractions.. Cardiovascular Pedal Pulses WNL. No clubbing, cyanosis or edema. Chest Breasts symmetical and no nipple discharge.. Breast tissue WNL, no masses, lumps, or tenderness.. Musculoskeletal Adexa without tenderness or enlargement.. Digits and nails w/o clubbing, cyanosis, infection, petechiae, ischemia, or inflammatory conditions.Marland Kitchen Psychiatric Judgement and insight Intact.. No evidence of depression, anxiety, or agitation.. General Notes: The ulcerated area looks good and there is no evidence of any drainage or cellulitis. Integumentary (Hair, Skin) Sedberry, Calirose M. ([id number]) No suspicious lesions. No crepitus or fluctuance. No peri-wound warmth or erythema. No masses.. Wound #1 status is Open. Original cause of wound was Gradually Appeared. The wound is located on  the Left,Lateral,Plantar Foot. The wound measures 1.5cm length x 1cm width x 0.3cm depth; 1.178cm^2 area and 0.353cm^3 volume. The wound is limited to skin breakdown. There is a large amount of serosanguineous drainage noted. The wound margin is thickened. There is medium (34-66%) pink granulation within the wound bed. There is a small (1-33%) amount of necrotic tissue within the wound bed including Adherent Slough. The periwound skin appearance exhibited: Callus, Moist. The periwound skin appearance did not exhibit: Crepitus, Excoriation, Fluctuance, Friable, Induration, Localized Edema, Rash, Scarring, Dry/Scaly, Maceration, Atrophie Blanche, Cyanosis, Ecchymosis, Hemosiderin Staining, Mottled, Pallor, Rubor, Erythema. Periwound temperature was noted as No Abnormality. Assessment Active Problems ICD-10 E11.621 - Type 2 diabetes mellitus with foot ulcer E08.40 - Diabetes mellitus due to underlying condition with diabetic neuropathy, unspecified L97.513 - Non-pressure chronic ulcer of other part of right foot with necrosis of muscle F17.218 - Nicotine dependence, cigarettes, with other nicotine-induced disorders E66.01 - Morbid (severe) obesity due to excess calories We will continue with the local care and apply a total contact cast in the usual fashion. She would come back and see as next week. Procedures Wound #1 Wound #1 is a Diabetic Wound/Ulcer of the Lower Extremity located on the Left,Lateral,Plantar Foot . There was a Total Contact Cast Procedure by Tristan Schroeder., MD. Notes: She has done well after the first application of a total contact cast and she is here in 48 hours to check her feet and wound and everything looks good. We will place a contact cast and follow the usual precautions.. Plan Crable, [name] ([id number]) Wound Cleansing: Wound #1 Left,Lateral,Plantar Foot: Clean wound with Normal Saline. Anesthetic: Wound #1 Left,Lateral,Plantar Foot: Topical Lidocaine  4% cream applied to wound bed prior to debridement Primary Wound Dressing: Wound #1 Left,Lateral,Plantar Foot: Prisma Ag - double layer Secondary Dressing: Wound #1 Left,Lateral,Plantar Foot: Foam Drawtex Dressing Change Frequency: Wound #1 Left,Lateral,Plantar Foot: Change dressing every week Follow-up Appointments: Wound #1 Left,Lateral,Plantar Foot: Return Appointment in 1 week. Off-Loading: Wound #1 Left,Lateral,Plantar Foot: Total Contact Cast to Left Lower Extremity Hyperbaric Oxygen Therapy: Wound #1 Left,Lateral,Plantar Foot: Evaluate for HBO Therapy Indication: - wagner grade 3 We will continue with the local care and apply a total contact cast in the usual fashion. She would come back and see as next week. Electronic Signature(s) Signed: 01/11/2015 2:45:51 PM By: [name] MD, FACS Previous Signature: 01/11/2015 2:19:29 PM Version By: [name] MD, FACS Previous Signature: 01/11/2015 2:19:18 PM Version By: [name] MD, FACS Entered By: [name] on 01/11/2015 14:45:51 Fronczak, [name] ([id number]) -------------------------------------------------------------------------------- Total Contact Cast Details Patient  Name: Schleyer, Jadee M. Date of Service: 01/11/2015 1:45 PM Medical Record Number: [id number] Patient Account Number: [id number] Date of Birth/Sex: [birth year] (39 y.o. Female) Treating RN: Primary Care Physician: [name] Other Clinician: Referring Physician: [name] Treating Physician/Extender: [name] in Treatment: 6 Total Contact Cast Applied for Wound Wound #1 Left,Lateral,Plantar Foot Assessment: Performed By: Physician Tristan Schroeder., MD Notes She has done well after the first application of a total contact cast and she is here in 48 hours to check her feet and wound and everything looks good. We will place a contact cast and follow the usual precautions. Electronic Signature(s) Signed: 01/11/2015  2:17:44 PM By: [name] MD, FACS Entered By: [name] on 01/11/2015 14:17:44 Murrillo, [name] ([id number]) -------------------------------------------------------------------------------- SuperBill Details Patient Name: Alaimo, Isis M. Date of Service: 01/11/2015 Medical Record Number: [id number] Patient Account Number: [id number] Date of Birth/Sex: 18-Feb-1976 (39 y.o. Female) Treating RN: Primary Care Physician: [name] Other Clinician: Referring Physician: [name] Treating Physician/Extender: [name] in Treatment: 6 Diagnosis Coding ICD-10 Codes Code Description E11.621 Type 2 diabetes mellitus with foot ulcer E08.40 Diabetes mellitus due to underlying condition with diabetic neuropathy, unspecified L97.513 Non-pressure chronic ulcer of other part of right foot with necrosis of muscle F17.218 Nicotine dependence, cigarettes, with other nicotine-induced disorders E66.01 Morbid (severe) obesity due to excess calories Facility Procedures CPT4: Description Modifier Quantity Code [id number] - APPLY TOTAL CONTACT LEG CAST 1 ICD-10 Description Diagnosis L97.513 Non-pressure chronic ulcer of other part of right foot with necrosis of muscle E11.621 Type 2 diabetes mellitus with foot ulcer Physician Procedures CPT4: Description Modifier Quantity Code [id number] - WC PHYS APPLY TOTAL CONTACT CAST 1 ICD-10 Description Diagnosis L97.513 Non-pressure chronic ulcer of other part of right foot with necrosis of muscle E11.621 Type 2 diabetes mellitus with foot  ulcer Electronic Signature(s) Signed: 01/11/2015 2:20:33 PM By: [name] MD, FACS Entered By: [name] on 01/11/2015 14:20:33

## 2015-01-12 NOTE — Progress Notes (Signed)
[name] ([id number]) Visit Report for 01/11/2015 Arrival Information Details Patient Name: [name], [name] Date of Service: 01/11/2015 1:45 PM Medical Record Number: [id number] Patient Account Number: [id number] Date of Birth/Sex: 1975-11-19 (39 y.o. Female) Treating RN: Huel Coventry Primary Care Physician: [name] Other Clinician: Referring Physician: [name] Treating Physician/Extender: [name] in Treatment: 6 Visit Information History Since Last Visit All ordered tests and consults were completed: No Patient Arrived: Ambulatory Added or deleted any medications: No Arrival Time: 14:08 Any new allergies or adverse reactions: No Accompanied By: friend Had a fall or experienced change in No Transfer Assistance: None activities of daily living that may affect Patient Identification Verified: Yes risk of falls: Secondary Verification Process Yes Signs or symptoms of abuse/neglect since last No Completed: visito Patient Has Alerts: Yes Pain Present Now: No Patient Alerts: Type II Diabetic 11/30/2014 ABI: (L) 1.24 (R) 0.92 Electronic Signature(s) Signed: 01/11/2015 4:42:55 PM By: Elliot Gurney, RN, BSN, Kim RN, BSN Entered By: Elliot Gurney, RN, BSN, Kim on 01/11/2015 14:09:15 [name], [name] -------------------------------------------------------------------------------- Encounter Discharge Information Details Patient Name: [name], [name] Date of Service: 01/11/2015 1:45 PM Medical Record Number: [id number] Patient Account Number: [id number] Date of Birth/Sex: 07/05/1975 (39 y.o. Female) Treating RN: Huel Coventry Primary Care Physician: [name] Other Clinician: Referring Physician: [name] Treating Physician/Extender: [name] in Treatment: 6 Encounter Discharge Information Items Discharge Pain Level: 0 Discharge Condition: Stable Ambulatory Status: Ambulatory Discharge Destination:  Home Private Transportation: Auto Accompanied By: friend Schedule Follow-up Appointment: Yes Medication Reconciliation completed and Yes provided to Patient/Care [name]: Clinical Summary of Care: Electronic Signature(s) Signed: 01/11/2015 4:42:55 PM By: Elliot Gurney, RN, BSN, Kim RN, BSN Entered By: Elliot Gurney, RN, BSN, Kim on 01/11/2015 14:50:28 [name] -------------------------------------------------------------------------------- Lower Extremity Assessment Details Patient Name: [name], [name] Date of Service: 01/11/2015 1:45 PM Medical Record Number: [id number] Patient Account Number: [id number] Date of Birth/Sex: 03-07-1976 (39 y.o. Female) Treating RN: Huel Coventry Primary Care Physician: [name] Other Clinician: Referring Physician: [name] Treating Physician/Extender: [name] in Treatment: 6 Vascular Assessment Pulses: Posterior Tibial Dorsalis Pedis Palpable: [Left:Yes] Extremity colors, hair growth, and conditions: Extremity Color: [Left:Normal] Hair Growth on Extremity: [Left:No] Temperature of Extremity: [Left:Warm] Capillary Refill: [Left:< 3 seconds] Electronic Signature(s) Signed: 01/11/2015 4:42:55 PM By: Elliot Gurney, RN, BSN, Kim RN, BSN Entered By: Elliot Gurney, RN, BSN, Kim on 01/11/2015 14:10:18 [name] -------------------------------------------------------------------------------- Multi Wound Chart Details Patient Name: [name], [name] Date of Service: 01/11/2015 1:45 PM Medical Record Number: [id number] Patient Account Number: [id number] Date of Birth/Sex: 01/12/1976 (39 y.o. Female) Treating RN: Curtis Sites Primary Care Physician: [name] Other Clinician: Referring Physician: [name] Treating Physician/Extender: [name] in Treatment: 6 Vital Signs Height(in): 67 Pulse(bpm): 88 Weight(lbs): 290 Blood Pressure (mmHg): Body Mass Index(BMI):  45 Temperature(F): Respiratory Rate 16 (breaths/min): Photos: [1:No Photos] [N/A:N/A] Wound Location: [1:Left Foot - Plantar, Lateral N/A] Wounding Event: [1:Gradually Appeared] [N/A:N/A] Primary Etiology: [1:Diabetic Wound/Ulcer of N/A the Lower Extremity] Comorbid History: [1:Type II Diabetes] [N/A:N/A] Date Acquired: [1:11/21/2013] [N/A:N/A] Weeks of Treatment: [1:6] [N/A:N/A] Wound Status: [1:Open] [N/A:N/A] Measurements L x W x D 1.5x1x0.3 [N/A:N/A] (cm) Area (cm) : [1:1.178] [N/A:N/A] Volume (cm) : [1:0.353] [N/A:N/A] % Reduction in Area: [1:47.60%] [N/A:N/A] % Reduction in Volume: 82.50% [N/A:N/A] Classification: [1:Grade 3] [N/A:N/A] Wagner Verification: [1:Abscess] [N/A:N/A] Exudate Amount: [1:Large] [N/A:N/A] Exudate Type: [1:Serosanguineous] [N/A:N/A] Exudate Color: [1:red, brown] [N/A:N/A] Foul Odor After [1:Yes] [N/A:N/A] Cleansing: Odor Anticipated Due to No [N/A:N/A] Product Use: Wound  Margin: [1:Thickened] [N/A:N/A] Granulation Amount: [1:Medium (34-66%)] [N/A:N/A] Granulation Quality: [1:Pink] [N/A:N/A] Necrotic Amount: [1:Small (1-33%)] [N/A:N/A] Exposed Structures: [N/A:N/A] Fascia: No Fat: No Tendon: No Muscle: No Joint: No Bone: No Limited to Skin Breakdown Epithelialization: Small (1-33%) N/A N/A Periwound Skin Texture: Callus: Yes N/A N/A Edema: No Excoriation: No Induration: No Crepitus: No Fluctuance: No Friable: No Rash: No Scarring: No Periwound Skin Moist: Yes N/A N/A Moisture: Maceration: No Dry/Scaly: No Periwound Skin Color: Atrophie Blanche: No N/A N/A Cyanosis: No Ecchymosis: No Erythema: No Hemosiderin Staining: No Mottled: No Pallor: No Rubor: No Temperature: No Abnormality N/A N/A Tenderness on No N/A N/A Palpation: Wound Preparation: Ulcer Cleansing: N/A N/A Rinsed/Irrigated with Saline Topical Anesthetic Applied: Other: lidocaine 4% Procedures Performed: Total Contact Cast N/A N/A Treatment  Notes Electronic Signature(s) Signed: 01/11/2015 2:39:46 PM By: Curtis Sites Entered By: Curtis Sites on 01/11/2015 14:39:46 [name] ([id number]) -------------------------------------------------------------------------------- Multi-Disciplinary Care Plan Details Patient Name: [name], [name] Date of Service: 01/11/2015 1:45 PM Medical Record Number: [id number] Patient Account Number: [id number] Date of Birth/Sex: [birth year] (39 y.o. Female) Treating RN: Curtis Sites Primary Care Physician: [name] Other Clinician: Referring Physician: [name] Treating Physician/Extender: [name] in Treatment: 6 Active Inactive Nutrition Nursing Diagnoses: Potential for alteratiion in Nutrition/Potential for imbalanced nutrition Goals: Patient/caregiver agrees to and verbalizes understanding of need to use nutritional supplements and/or vitamins as prescribed Date Initiated: 11/30/2014 Goal Status: Active Interventions: Assess patient nutrition upon admission and as needed per policy Notes: Orientation to the Wound Care Program Nursing Diagnoses: Knowledge deficit related to the wound healing center program Goals: Patient/caregiver will verbalize understanding of the Wound Healing Center Program Date Initiated: 11/30/2014 Goal Status: Active Interventions: Provide education on orientation to the wound center Notes: Peripheral Neuropathy Nursing Diagnoses: Potential alteration in peripheral tissue perfusion (select prior to confirmation of diagnosis) Goals: Patient/caregiver will verbalize understanding of disease process and disease management [name], [name] Date Initiated: 11/30/2014 Goal Status: Active Interventions: Assess signs and symptoms of neuropathy upon admission and as needed Notes: Wound/Skin Impairment Nursing Diagnoses: Impaired tissue integrity Knowledge deficit related to smoking impact on wound  healing Goals: Ulcer/skin breakdown will have a volume reduction of 30% by week 4 Date Initiated: 11/30/2014 Goal Status: Active Interventions: Assess ulceration(s) every visit Notes: Electronic Signature(s) Signed: 01/11/2015 2:39:39 PM By: Curtis Sites Entered By: Curtis Sites on 01/11/2015 14:39:39 [name], [name] ([id number]) -------------------------------------------------------------------------------- Pain Assessment Details Patient Name: [name], [name] Date of Service: 01/11/2015 1:45 PM Medical Record Number: [id number] Patient Account Number: [id number] Date of Birth/Sex: Dec 06, 1975 (39 y.o. Female) Treating RN: Huel Coventry Primary Care Physician: [name] Other Clinician: Referring Physician: [name] Treating Physician/Extender: [name] in Treatment: 6 Active Problems Location of Pain Severity and Description of Pain Patient Has Paino No Site Locations Pain Management and Medication Current Pain Management: Electronic Signature(s) Signed: 01/11/2015 4:42:55 PM By: Elliot Gurney, RN, BSN, Kim RN, BSN Entered By: Elliot Gurney, RN, BSN, Kim on 01/11/2015 14:09:21 Stigger, [name] ([id number]) -------------------------------------------------------------------------------- Patient/Caregiver Education Details Patient Name: [name] Christine. Date of Service: 01/11/2015 1:45 PM Medical Record Number: [id number] Patient Account Number: [id number] Date of Birth/Gender: May 21, 1976 (39 y.o. Female) Treating RN: Huel Coventry Primary Care Physician: [name] Other Clinician: Referring Physician: [name] Treating Physician/Extender: [name] in Treatment: 6 Education Assessment Education Provided To: Patient Education Topics Provided Offloading: Handouts: Other: TCC cast instructions for emergency removal given to patient. Electronic Signature(s) Signed: 01/11/2015 4:42:55 PM By: Elliot Gurney, RN, BSN, Kim RN,  BSN Entered By:  Elliot Gurney, RN, BSN, Kim on 01/11/2015 14:51:17 [name], [name] ([id number]) -------------------------------------------------------------------------------- Wound Assessment Details Patient Name: [name], [name] Date of Service: 01/11/2015 1:45 PM Medical Record Number: [id number] Patient Account Number: [id number] Date of Birth/Sex: 22-Sep-1975 (39 y.o. Female) Treating RN: Huel Coventry Primary Care Physician: [name] Other Clinician: Referring Physician: [name] Treating Physician/Extender: [name] in Treatment: 6 Wound Status Wound Number: 1 Primary Diabetic Wound/Ulcer of the Lower Etiology: Extremity Wound Location: Left Foot - Plantar, Lateral Wound Status: Open Wounding Event: Gradually Appeared Comorbid Type II Diabetes Date Acquired: 11/21/2013 History: Weeks Of Treatment: 6 Clustered Wound: No Photos Photo Uploaded By: Elliot Gurney, RN, BSN, Kim on 01/11/2015 14:43:50 Wound Measurements Length: (cm) 1.5 Width: (cm) 1 Depth: (cm) 0.3 Area: (cm) 1.178 Volume: (cm) 0.353 % Reduction in Area: 47.6% % Reduction in Volume: 82.5% Epithelialization: Small (1-33%) Wound Description Classification: Grade 3 Wagner Verification: Abscess Wound Margin: Thickened Exudate Amount: Large Exudate Type: Serosanguineous Exudate Color: red, brown Foul Odor After Cleansing: Yes Due to Product Use: No Wound Bed Granulation Amount: Medium (34-66%) Exposed Structure Granulation Quality: Pink Fascia Exposed: No Necrotic Amount: Small (1-33%) Fat Layer Exposed: No [name], [name] ([id number]) Necrotic Quality: Adherent Slough Tendon Exposed: No Muscle Exposed: No Joint Exposed: No Bone Exposed: No Limited to Skin Breakdown Periwound Skin Texture Texture Color No Abnormalities Noted: No No Abnormalities Noted: No Callus: Yes Atrophie Blanche: No Crepitus: No Cyanosis: No Excoriation: No Ecchymosis: No Fluctuance: No Erythema: No Friable:  No Hemosiderin Staining: No Induration: No Mottled: No Localized Edema: No Pallor: No Rash: No Rubor: No Scarring: No Temperature / Pain Moisture Temperature: No Abnormality No Abnormalities Noted: No Dry / Scaly: No Maceration: No Moist: Yes Wound Preparation Ulcer Cleansing: Rinsed/Irrigated with Saline Topical Anesthetic Applied: Other: lidocaine 4%, Treatment Notes Wound #1 (Left, Lateral, Plantar Foot) 4. Dressing Applied: Prisma Ag Notes Prisma Ag, Drawtex and foam. TCC applied by MD Electronic Signature(s) Signed: 01/11/2015 4:42:55 PM By: Elliot Gurney, RN, BSN, Kim RN, BSN Entered By: Elliot Gurney, RN, BSN, Kim on 01/11/2015 14:11:40 Laske, [name] ([id number]) -------------------------------------------------------------------------------- Vitals Details Patient Name: Sherpa, [name] Date of Service: 01/11/2015 1:45 PM Medical Record Number: [id number] Patient Account Number: [id number] Date of Birth/Sex: 1975/10/06 (39 y.o. Female) Treating RN: Huel Coventry Primary Care Physician: [name] Other Clinician: Referring Physician: [name] Treating Physician/Extender: [name] in Treatment: 6 Vital Signs Time Taken: 14:09 Pulse (bpm): 88 Height (in): 67 Respiratory Rate (breaths/min): 16 Weight (lbs): 290 Reference Range: 80 - 120 mg / dl Body Mass Index (BMI): 45.4 Electronic Signature(s) Signed: 01/11/2015 4:42:55 PM By: Elliot Gurney, RN, BSN, Kim RN, BSN Entered By: Elliot Gurney, RN, BSN, Kim on 01/11/2015 14:09:44

## 2015-01-18 ENCOUNTER — Encounter (HOSPITAL_BASED_OUTPATIENT_CLINIC_OR_DEPARTMENT_OTHER): Payer: Medicaid Other | Admitting: General Surgery

## 2015-01-18 ENCOUNTER — Encounter: Payer: Self-pay | Admitting: General Surgery

## 2015-01-18 DIAGNOSIS — E10621 Type 1 diabetes mellitus with foot ulcer: Secondary | ICD-10-CM | POA: Diagnosis not present

## 2015-01-18 DIAGNOSIS — L97509 Non-pressure chronic ulcer of other part of unspecified foot with unspecified severity: Secondary | ICD-10-CM

## 2015-01-18 DIAGNOSIS — L97421 Non-pressure chronic ulcer of left heel and midfoot limited to breakdown of skin: Secondary | ICD-10-CM | POA: Diagnosis not present

## 2015-01-18 NOTE — Progress Notes (Signed)
See i heal 

## 2015-01-19 NOTE — Progress Notes (Signed)
BETHA, SHADIX ([id number]) Visit Report for 01/18/2015 Arrival Information Details Patient Name: ELLIANA, BAL. Date of Service: 01/18/2015 4:00 PM Medical Record Number: [id number] Patient Account Number: [id number] Date of Birth/Sex: 1976/01/02 (38 y.o. Female) Treating RN: Afful, RN, BSN, [place] Sink Primary Care Physician: [name] Other Clinician: Referring Physician: [name] Treating Physician/Extender: Elayne Snare in Treatment: 7 Visit Information History Since Last Visit Any new allergies or adverse reactions: No Patient Arrived: Ambulatory Had a fall or experienced change in No Arrival Time: 16:08 activities of daily living that may affect Accompanied By: self risk of falls: Transfer Assistance: None Signs or symptoms of abuse/neglect since No Patient Identification Verified: Yes last visito Secondary Verification Process Yes Hospitalized since last visit: No Completed: Has Dressing in Place as Prescribed: Yes Patient Has Alerts: Yes Has Footwear/Offloading in Place as Yes Patient Alerts: Type II Diabetic Prescribed: 11/30/2014 ABI: Left: Total Contact (L) 1.24 (R) Cast 0.92 Pain Present Now: No Electronic Signature(s) Signed: 01/18/2015 5:24:15 PM By: Elpidio Eric BSN, RN Entered By: Elpidio Eric on 01/18/2015 16:28:58 Azucena, [name] ([id number]) -------------------------------------------------------------------------------- Encounter Discharge Information Details Patient Name: Oriley, Tasheema M. Date of Service: 01/18/2015 4:00 PM Medical Record Number: [id number] Patient Account Number: [id number] Date of Birth/Sex: 06/04/1976 (38 y.o. Female) Treating RN: Afful, RN, BSN, [place] Sink Primary Care Physician: [name] Other Clinician: Referring Physician: [name] Treating Physician/Extender: Elayne Snare in Treatment: 7 Encounter Discharge Information Items Discharge Pain Level: 0 Discharge Condition:  Stable Ambulatory Status: Ambulatory Discharge Destination: Home Private Transportation: Auto Accompanied By: self Schedule Follow-up Appointment: No Medication Reconciliation completed and No provided to Patient/Care Karen Kinnard: Clinical Summary of Care: Electronic Signature(s) Signed: 01/18/2015 5:10:45 PM By: [name] MD Entered By: [name] on 01/18/2015 17:10:45 Heiler, [name] ([id number]) -------------------------------------------------------------------------------- Lower Extremity Assessment Details Patient Name: Boeding, Yarielis M. Date of Service: 01/18/2015 4:00 PM Medical Record Number: [id number] Patient Account Number: [id number] Date of Birth/Sex: Oct 22, 1975 (38 y.o. Female) Treating RN: Afful, RN, BSN, [place] Sink Primary Care Physician: [name] Other Clinician: Referring Physician: [name] Treating Physician/Extender: Elayne Snare in Treatment: 7 Vascular Assessment Pulses: Posterior Tibial Dorsalis Pedis Palpable: [Left:Yes] Extremity colors, hair growth, and conditions: Extremity Color: [Left:Normal] Hair Growth on Extremity: [Left:Yes] Temperature of Extremity: [Left:Warm] Capillary Refill: [Left:< 3 seconds] Toe Nail Assessment Left: Right: Thick: No Discolored: No Deformed: No Improper Length and Hygiene: No Electronic Signature(s) Signed: 01/18/2015 5:24:15 PM By: Elpidio Eric BSN, RN Entered By: Elpidio Eric on 01/18/2015 16:29:23 Brendel, [name] ([id number]) -------------------------------------------------------------------------------- Multi Wound Chart Details Patient Name: Voight, Mairyn M. Date of Service: 01/18/2015 4:00 PM Medical Record Number: [id number] Patient Account Number: [id number] Date of Birth/Sex: 1976-06-23 (38 y.o. Female) Treating RN: Clover Mealy, RN, BSN, [place] Sink Primary Care Physician: [name] Other Clinician: Referring Physician: [name] Treating Physician/Extender: Elayne Snare in Treatment: 7 Vital Signs Height(in): 67 Pulse(bpm): 98 Weight(lbs): 290 Blood Pressure 121/107 (mmHg): Body Mass Index(BMI): 45 Temperature(F): 98.2 Respiratory Rate 18 (breaths/min): Photos: [1:No Photos] [N/A:N/A] Wound Location: [1:Left Foot - Plantar, Lateral N/A] Wounding Event: [1:Gradually Appeared] [N/A:N/A] Primary Etiology: [1:Diabetic Wound/Ulcer of N/A the Lower Extremity] Comorbid History: [1:Type II Diabetes] [N/A:N/A] Date Acquired: [1:11/21/2013] [N/A:N/A] Weeks of Treatment: [1:7] [N/A:N/A] Wound Status: [1:Open] [N/A:N/A] Measurements L x W x D 1.5x1x0.3 [N/A:N/A] (cm) Area (cm) : [1:1.178] [N/A:N/A] Volume (cm) : [1:0.353] [N/A:N/A] % Reduction in Area: [1:47.60%] [N/A:N/A] % Reduction in Volume: 82.50% [N/A:N/A] Classification: [1:Grade 3] [N/A:N/A] Wagner Verification: [1:Abscess] [N/A:N/A] Exudate Amount: [1:Large] [N/A:N/A] Exudate Type: [  1:Serosanguineous] [N/A:N/A] Exudate Color: [1:red, brown] [N/A:N/A] Foul Odor After [1:Yes] [N/A:N/A] Cleansing: Odor Anticipated Due to No [N/A:N/A] Product Use: Wound Margin: [1:Thickened] [N/A:N/A] Granulation Amount: [1:Medium (34-66%)] [N/A:N/A] Granulation Quality: [1:Pink] [N/A:N/A] Necrotic Amount: [1:Small (1-33%)] [N/A:N/A] Exposed Structures: [N/A:N/A] Fascia: No Fat: No Tendon: No Muscle: No Joint: No Bone: No Limited to Skin Breakdown Epithelialization: Small (1-33%) N/A N/A Periwound Skin Texture: Callus: Yes N/A N/A Edema: No Excoriation: No Induration: No Crepitus: No Fluctuance: No Friable: No Rash: No Scarring: No Periwound Skin Maceration: Yes N/A N/A Moisture: Moist: Yes Dry/Scaly: No Periwound Skin Color: Atrophie Blanche: No N/A N/A Cyanosis: No Ecchymosis: No Erythema: No Hemosiderin Staining: No Mottled: No Pallor: No Rubor: No Temperature: No Abnormality N/A N/A Tenderness on No N/A N/A Palpation: Wound Preparation: Ulcer Cleansing: N/A  N/A Rinsed/Irrigated with Saline Topical Anesthetic Applied: Other: lidocaine 4% Treatment Notes Electronic Signature(s) Signed: 01/18/2015 5:24:15 PM By: Elpidio Eric BSN, RN Entered By: Elpidio Eric on 01/18/2015 16:28:45 [name], [name] ([id number]) -------------------------------------------------------------------------------- Multi-Disciplinary Care Plan Details Patient Name: Geisen, [name] Date of Service: 01/18/2015 4:00 PM Medical Record Number: [id number] Patient Account Number: [id number] Date of Birth/Sex: 05-Aug-1975 (38 y.o. Female) Treating RN: Afful, RN, BSN, [place] Sink Primary Care Physician: [name] Other Clinician: Referring Physician: [name] Treating Physician/Extender: Elayne Snare in Treatment: 7 Active Inactive Nutrition Nursing Diagnoses: Potential for alteratiion in Nutrition/Potential for imbalanced nutrition Goals: Patient/caregiver agrees to and verbalizes understanding of need to use nutritional supplements and/or vitamins as prescribed Date Initiated: 11/30/2014 Goal Status: Active Interventions: Assess patient nutrition upon admission and as needed per policy Notes: Orientation to the Wound Care Program Nursing Diagnoses: Knowledge deficit related to the wound healing center program Goals: Patient/caregiver will verbalize understanding of the Wound Healing Center Program Date Initiated: 11/30/2014 Goal Status: Active Interventions: Provide education on orientation to the wound center Notes: Peripheral Neuropathy Nursing Diagnoses: Potential alteration in peripheral tissue perfusion (select prior to confirmation of diagnosis) Goals: Patient/caregiver will verbalize understanding of disease process and disease management Giammarco, JARRETT CHICOINE. ([id number]) Date Initiated: 11/30/2014 Goal Status: Active Interventions: Assess signs and symptoms of neuropathy upon admission and as needed Notes: Wound/Skin Impairment Nursing  Diagnoses: Impaired tissue integrity Knowledge deficit related to smoking impact on wound healing Goals: Ulcer/skin breakdown will have a volume reduction of 30% by week 4 Date Initiated: 11/30/2014 Goal Status: Active Interventions: Assess ulceration(s) every visit Notes: Electronic Signature(s) Signed: 01/18/2015 5:24:15 PM By: Elpidio Eric BSN, RN Entered By: Elpidio Eric on 01/18/2015 16:24:13 Kohen, [name] ([id number]) -------------------------------------------------------------------------------- Pain Assessment Details Patient Name: Warshaw, Shandrea M. Date of Service: 01/18/2015 4:00 PM Medical Record Number: [id number] Patient Account Number: [id number] Date of Birth/Sex: [birth year] (38 y.o. Female) Treating RN: Clover Mealy, RN, BSN, [place] Sink Primary Care Physician: [name] Other Clinician: Referring Physician: [name] Treating Physician/Extender: Elayne Snare in Treatment: 7 Active Problems Location of Pain Severity and Description of Pain Patient Has Paino No Site Locations Pain Management and Medication Current Pain Management: Electronic Signature(s) Signed: 01/18/2015 5:24:15 PM By: Elpidio Eric BSN, RN Entered By: Elpidio Eric on 01/18/2015 16:29:04 Strutz, [name] ([id number]) -------------------------------------------------------------------------------- Patient/Caregiver Education Details Patient Name: Gorney, [name] Date of Service: 01/18/2015 4:00 PM Medical Record Number: [id number] Patient Account Number: [id number] Date of Birth/Gender: 1975-11-23 (39 y.o. Female) Treating RN: Afful, RN, BSN, [place] Sink Primary Care Physician: [name] Other Clinician: Referring Physician: [name] Treating Physician/Extender: [name] in Treatment: 7 Education Assessment Education Provided To: Patient Education Topics Provided Basic Hygiene: Methods:  Explain/Verbal Responses: State content correctly Welcome To The [facility]: Methods: Explain/Verbal Wound Debridement: Methods: Explain/Verbal Responses: State content correctly Wound/Skin Impairment: Methods: Explain/Verbal Responses: State content correctly Electronic Signature(s) Signed: 01/18/2015 5:10:57 PM By: [name] MD Entered By: [name] on 01/18/2015 17:10:57 Cartelli, [name] ([id number]) -------------------------------------------------------------------------------- Wound Assessment Details Patient Name: Epting, Halston M. Date of Service: 01/18/2015 4:00 PM Medical Record Number: [id number] Patient Account Number: [id number] Date of Birth/Sex: May 18, 1976 (38 y.o. Female) Treating RN: Afful, RN, BSN, [place] Sink Primary Care Physician: [name] Other Clinician: Referring Physician: [name] Treating Physician/Extender: [name] in Treatment: 7 Wound Status Wound Number: 1 Primary Diabetic Wound/Ulcer of the Lower Etiology: Extremity Wound Location: Left Foot - Plantar, Lateral Wound Status: Open Wounding Event: Gradually Appeared Comorbid Type II Diabetes Date Acquired: 11/21/2013 History: Weeks Of Treatment: 7 Clustered Wound: No Photos Photo Uploaded By: Elpidio Eric on 01/18/2015 17:20:18 Wound Measurements Length: (cm) 1.5 Width: (cm) 1 Depth: (cm) 0.3 Area: (cm) 1.178 Volume: (cm) 0.353 % Reduction in Area: 47.6% % Reduction in Volume: 82.5% Epithelialization: Small (1-33%) Tunneling: No Undermining: No Wound Description Classification: Grade 3 Wagner Verification: Abscess Wound Margin: Thickened Exudate Amount: Large Exudate Type: Serosanguineous Exudate Color: red, brown Foul Odor After Cleansing: Yes Due to Product Use: No Wound Bed Granulation Amount: Medium (34-66%) Exposed Structure Granulation Quality: Pink Fascia Exposed: No Necrotic Amount: Small (1-33%) Fat Layer Exposed: No Panozzo, Sila M. ([id number]) Necrotic Quality: Adherent Slough Tendon Exposed:  No Muscle Exposed: No Joint Exposed: No Bone Exposed: No Limited to Skin Breakdown Periwound Skin Texture Texture Color No Abnormalities Noted: No No Abnormalities Noted: No Callus: Yes Atrophie Blanche: No Crepitus: No Cyanosis: No Excoriation: No Ecchymosis: No Fluctuance: No Erythema: No Friable: No Hemosiderin Staining: No Induration: No Mottled: No Localized Edema: No Pallor: No Rash: No Rubor: No Scarring: No Temperature / Pain Moisture Temperature: No Abnormality No Abnormalities Noted: No Dry / Scaly: No Maceration: Yes Moist: Yes Wound Preparation Ulcer Cleansing: Rinsed/Irrigated with Saline Topical Anesthetic Applied: Other: lidocaine 4%, Treatment Notes Wound #1 (Left, Lateral, Plantar Foot) 1. Cleansed with: Clean wound with Normal Saline 4. Dressing Applied: Aquacel Ag 5. Secondary Dressing Applied Foam Kerlix/Conform 6. Footwear/Offloading device applied Other footwear/offloading device applied (specify in notes) 7. Secured with Tape Notes Drawtex and foam. darco Electronic Signature(s) Stimpson, LENNA HAGARTY ([id number]) Signed: 01/18/2015 5:24:15 PM By: Elpidio Eric BSN, RN Entered By: Elpidio Eric on 01/18/2015 16:23:14 Scharf, [name] ([id number]) -------------------------------------------------------------------------------- Vitals Details Patient Name: Ramiro, Daris M. Date of Service: 01/18/2015 4:00 PM Medical Record Number: [id number] Patient Account Number: [id number] Date of Birth/Sex: May 04, 1976 (38 y.o. Female) Treating RN: Afful, RN, BSN, [place] Sink Primary Care Physician: [name] Other Clinician: Referring Physician: [name] Treating Physician/Extender: Elayne Snare in Treatment: 7 Vital Signs Time Taken: 16:11 Temperature (F): 98.2 Height (in): 67 Pulse (bpm): 98 Weight (lbs): 290 Respiratory Rate (breaths/min): 18 Body Mass Index (BMI): 45.4 Blood Pressure (mmHg): 121/107 Reference Range: 80  - 120 mg / dl Electronic Signature(s) Signed: 01/18/2015 5:24:15 PM By: Elpidio Eric BSN, RN Entered By: Elpidio Eric on 01/18/2015 16:29:13

## 2015-01-22 NOTE — Progress Notes (Signed)
SUHEYLA, MORTELLARO ([id number]) Visit Report for 01/18/2015 Chief Complaint Document Details Patient Name: Wilbourn, CAMARA RENSTROM. Date of Service: 01/18/2015 4:00 PM Medical Record Number: [id number] Patient Account Number: [id number] Date of Birth/Sex: [birth year] (39 y.o. Female) Treating RN: Primary Care Physician: [name] Other Clinician: Referring Physician: [name] Treating Physician/Extender: Elayne Snare in Treatment: 7 Information Obtained from: Patient Chief Complaint Patients presents for treatment of an open diabetic ulcer. The patient has had an ulcerated wound on her left lower extremity for about 2 years. Electronic Signature(s) Signed: 01/18/2015 5:08:55 PM By: [name] MD Previous Signature: 01/18/2015 4:49:16 PM Version By: [name] MD Entered By: [name] on 01/18/2015 17:08:55 Pursel, [name] ([id number]) -------------------------------------------------------------------------------- Debridement Details Patient Name: Ramseyer, Zira M. Date of Service: 01/18/2015 4:00 PM Medical Record Patient Account Number: [id number] Number: Afful, RN, BSN, Treating RN: 1976/03/31 (39 y.o. [place] Date of Birth/Sex: Female) Other Clinician: Primary Care Physician: [name] Treating [name] Referring Physician: [name] Physician/Extender: [name] in Treatment: 7 Debridement Performed for Wound #1 Left,Lateral,Plantar Foot Assessment: Performed By: Physician Tristan Schroeder., MD Debridement: Debridement Pre-procedure Yes Verification/Time Out Taken: Start Time: 16:28 Pain Control: Lidocaine 4% Topical Solution Level: Skin/Subcutaneous Tissue Total Area Debrided (L x 1.5 (cm) x 1 (cm) = 1.5 (cm) W): Tissue and other Non-Viable, Callus, Fibrin/Slough, Subcutaneous material debrided: Instrument: Blade Bleeding: Moderate Hemostasis Achieved: Silver Nitrate End Time: 16:33 Procedural Pain: 0 Post  Procedural Pain: 0 Response to Treatment: Procedure was tolerated well Post Debridement Measurements of Total Wound Length: (cm) 1.7 Width: (cm) 1 Depth: (cm) 0.3 Volume: (cm) 0.401 Electronic Signature(s) Signed: 01/18/2015 5:24:15 PM By: Elpidio Eric BSN, RN Signed: 01/22/2015 12:31:06 PM By: [name] MD, FACS Entered By: Elpidio Eric on 01/18/2015 16:32:25 Begue, [name] ([id number]) -------------------------------------------------------------------------------- HPI Details Patient Name: Dusenbery, Kennadie M. Date of Service: 01/18/2015 4:00 PM Medical Record Number: [id number] Patient Account Number: [id number] Date of Birth/Sex: 07/11/1975 (39 y.o. Female) Treating RN: Primary Care Physician: [name] Other Clinician: Referring Physician: [name] Treating Physician/Extender: [name] in Treatment: 7 History of Present Illness Location: left foot ulcer Quality: Patient reports No Pain. Severity: Wound is moderately severed with some exposed structure in the wound bed. Duration: Patient has had the wound for > 2 years prior to seeking treatment at the wound center Timing: for the last 2 years she has been treated at the [facility] wound care center Context: The wound appeared gradually over time Modifying Factors: Other treatment(s) tried include:in the last month she has been operated on the left foot for an abscess of that foot and was in hospital for 2 weeks. Associated Signs and Symptoms: Periwound excoriated and wound draining heavily HPI Description: 39 year old patient who is known to be a diabetic comes from the office of Dr. Dario Guardian for a ulcer on the left lower extremity and foot. She has had an ulcer on the left foot and has had previous treatment at [facility]. the patient tells [place] that she has been seen as the outpatient center for a long while and has had various tests including MRIs done but she is not a very good historian. Most recently about a  month ago she had a abscess drained from her left foot and was in hospital for about 2 weeks and was then discharged with a PICC line and IV antibioticstics which she knows may be vancomycin and Levaquin. She has never had a wound VAC now has she had hyperbaric oxygen therapy. Past medical  history significant for dyspnea, hypertension, hyperlipidemia, neuropathy, diabetes type 2 uncontrolled. She smokes cigarettes the latest blood reports are from March 2016 and show that her BMP is essentially normal her lipid panel was also normal her hemoglobin A1c was 9.4. 12/07/2014 -- Her tissue culture is back and she has grown significant amounts of Proteus mirabilis, pseudomonas aeruginosa and Staphylococcus aureus. The staph is a MSSA and all the cultures are sensitive to ciprofloxacin. I will start her on Cipro 500 mg by mouth twice a day for 14 days. Xray left foot -- IMPRESSION:No evidence of osteomyelitis. Healed fracture of the distal left third metatarsal. 12/21/2014 -- she is still awaiting clearance from her insurance company for HBOT. She was seen by Dr. Dario Guardian was working on controlling her diabetes and she continues to work on giving up smoking. She has completed her dosage of ciprofloxacin and we will be continuing with local care. 01/03/2015 -- her insurance clearance is still not in and we are awaiting this before starting her on a hyperbaric oxygen therapy. 01/09/2015 - we are still awaiting her insurance clearance and other than that she is doing fine. Electronic Signature(s) [name], VIRIDIANA SPAID ([id number]) Signed: 01/18/2015 5:09:05 PM By: [name] MD Signed: 01/22/2015 12:31:06 PM By: [name] MD, FACS Previous Signature: 01/18/2015 4:49:36 PM Version By: [name] MD Entered By: [name] on 01/18/2015 17:09:05 Brasel, [name] ([id number]) -------------------------------------------------------------------------------- Physical Exam Details Patient Name:  Hornbeck, Lashae M. Date of Service: 01/18/2015 4:00 PM Medical Record Number: [id number] Patient Account Number: [id number] Date of Birth/Sex: Aug 01, 1975 (38 y.o. Female) Treating RN: Primary Care Physician: [name] Other Clinician: Referring Physician: [name] Treating Physician/Extender: [name] in Treatment: 7 Electronic Signature(s) Signed: 01/18/2015 5:09:17 PM By: [name] MD Signed: 01/22/2015 12:31:06 PM By: [name] MD, FACS Previous Signature: 01/18/2015 4:49:45 PM Version By: [name] MD Entered By: [name] on 01/18/2015 17:09:17 Hendley, [name] ([id number]) -------------------------------------------------------------------------------- Physician Orders Details Patient Name: Rockefeller, Jeralynn M. Date of Service: 01/18/2015 4:00 PM Medical Record Patient Account Number: [id number] Number: Afful, RN, BSN, Treating RN: Nov 13, 1975 (38 y.o. [place] Date of Birth/Sex: Female) Other Clinician: Primary Care Physician: [name], [name] Referring Physician: [name] Physician/Extender: [name] in Treatment: 7 Verbal / Phone Orders: Yes Clinician: Afful, RN, BSN, [name] Read Back and Verified: Yes Diagnosis Coding Wound Cleansing Wound #1 Left,Lateral,Plantar Foot o Cleanse wound with mild soap and water o May Shower, gently pat wound dry prior to applying new dressing. o May shower with protection. Primary Wound Dressing Wound #1 Left,Lateral,Plantar Foot o Aquacel Ag o Drawtex Secondary Dressing Wound #1 Left,Lateral,Plantar Foot o Gauze and Kerlix/Conform Dressing Change Frequency Wound #1 Left,Lateral,Plantar Foot o Change dressing every day. Follow-up Appointments Wound #1 Left,Lateral,Plantar Foot o Return Appointment in 1 week. Off-Loading Wound #1 Left,Lateral,Plantar Foot o Open toe surgical shoe with peg assist. Electronic Signature(s) Signed: 01/18/2015  5:24:15 PM By: Elpidio Eric BSN, RN Entered By: Elpidio Eric on 01/18/2015 16:33:41 Marston, [name] ([id number]) -------------------------------------------------------------------------------- Problem List Details Patient Name: Okane, Khadijah M. Date of Service: 01/18/2015 4:00 PM Medical Record Number: [id number] Patient Account Number: [id number] Date of Birth/Sex: 07-02-1975 (38 y.o. 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 F17.218 Nicotine dependence, cigarettes, with other nicotine- 11/30/2014 Yes induced disorders E66.01 Morbid (severe) obesity due to excess calories 11/30/2014 Yes Inactive Problems Resolved Problems Electronic Signature(s) Signed: 01/18/2015 5:08:36 PM By: [name] MD Signed: 01/22/2015 12:31:06 PM By: [name] MD, FACS Previous Signature: 01/18/2015 5:08:07 PM Version By: [name] MD Previous Signature: 01/18/2015 4:48:51 PM Version By: [name] MD Previous Signature: 01/18/2015 4:48:08 PM Version By: [name] MD Previous Signature: 01/18/2015 4:42:41 PM Version By: [name] MD Entered By: [name] on 01/18/2015 17:08:35 Alderfer, [name] ([id number]) -------------------------------------------------------------------------------- Progress Note Details Patient Name: Teeple, Sharrell M. Date of Service: 01/18/2015 4:00 PM Medical Record Number: [id number] Patient Account Number: [id number] Date of Birth/Sex: 1976-06-10 (38 y.o. Female) Treating  RN: Primary Care Physician: [name] Other Clinician: Referring Physician: [name] Treating Physician/Extender: [name] in Treatment: 7 Subjective Chief Complaint Information obtained from Patient Patients presents for treatment of an open diabetic ulcer. The patient has had an ulcerated wound on her left lower extremity for about 2 years. History of Present Illness (HPI) The following HPI elements were documented for the patient's wound: Location: left foot ulcer Quality: Patient reports No Pain. Severity: Wound is moderately severed with some exposed structure in the wound bed. Duration: Patient has had the wound for > 2 years prior to seeking treatment at the wound center Timing: for the last 2 years she has been treated at the [facility] wound care center Context: The wound appeared gradually over time Modifying Factors: Other treatment(s) tried include:in the last month she has been operated on the left foot for an abscess of that foot and was in hospital for 2 weeks. Associated Signs and Symptoms: Periwound excoriated and wound draining heavily 39 year old patient who is known to be a diabetic comes from the office of Dr. Dario Guardian for a ulcer on the left lower extremity and foot. She has had an ulcer on the left foot and has had previous treatment at [facility]. the patient tells [place] that she has been seen as the outpatient center for a long while and has had various tests including MRIs done but she is not a very good historian. Most recently about a month ago she had a abscess drained from her left foot and was in hospital for about 2 weeks and was then discharged with a PICC line and IV antibioticstics which she knows may be vancomycin and Levaquin. She has never had a wound VAC now has she had hyperbaric oxygen therapy. Past medical history significant for dyspnea, hypertension, hyperlipidemia, neuropathy, diabetes type 2 uncontrolled. She smokes cigarettes the  latest blood reports are from March 2016 and show that her BMP is essentially normal her lipid panel was also normal her hemoglobin A1c was 9.4. 12/07/2014 -- Her tissue culture is back and she has grown significant amounts of Proteus mirabilis, pseudomonas aeruginosa and Staphylococcus aureus. The staph is a MSSA and all the cultures are sensitive to ciprofloxacin. I will start her on Cipro 500 mg by mouth twice a day for 14 days. Xray left foot -- IMPRESSION:No evidence of osteomyelitis. Healed fracture of the distal left third metatarsal. Bellavance, [name] ([id number]) 12/21/2014 -- she is still awaiting clearance from her insurance company for HBOT. She was seen by Dr. Dario Guardian was working on controlling her diabetes and she continues to work on giving up smoking. She has completed her dosage of ciprofloxacin and we  will be continuing with local care. 01/03/2015 -- her insurance clearance is still not in and we are awaiting this before starting her on a hyperbaric oxygen therapy. 01/09/2015 - we are still awaiting her insurance clearance and other than that she is doing fine. Objective Constitutional Vitals Time Taken: 4:11 PM, Height: 67 in, Weight: 290 lbs, BMI: 45.4, Temperature: 98.2 F, Pulse: 98 bpm, Respiratory Rate: 18 breaths/min, Blood Pressure: 121/107 mmHg. Integumentary (Hair, Skin) Wound #1 status is Open. Original cause of wound was Gradually Appeared. The wound is located on the Left,Lateral,Plantar Foot. The wound measures 1.5cm length x 1cm width x 0.3cm depth; 1.178cm^2 area and 0.353cm^3 volume. The wound is limited to skin breakdown. There is no tunneling or undermining noted. There is a large amount of serosanguineous drainage noted. The wound margin is thickened. There is medium (34-66%) pink granulation within the wound bed. There is a small (1-33%) amount of necrotic tissue within the wound bed including Adherent Slough. The periwound skin appearance exhibited:  Callus, Maceration, Moist. The periwound skin appearance did not exhibit: Crepitus, Excoriation, Fluctuance, Friable, Induration, Localized Edema, Rash, Scarring, Dry/Scaly, Atrophie Blanche, Cyanosis, Ecchymosis, Hemosiderin Staining, Mottled, Pallor, Rubor, Erythema. Periwound temperature was noted as No Abnormality. 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 ([id number]) Patient did not tolerate TCC so today I debrided callous and willprotect with felt and use silver alginate daily Procedures Wound #1 Wound #1 is a Diabetic Wound/Ulcer of the Lower Extremity located on the Left,Lateral,Plantar Foot . There was a Skin/Subcutaneous Tissue Debridement ([id number]) debridement with total area of 1.5 sq cm performed by Tristan Schroeder., MD. with the following instrument(s): Blade to remove Non-Viable tissue/material including Fibrin/Slough, Callus, and Subcutaneous after achieving pain control using Lidocaine 4% Topical Solution. A time out was conducted prior to the start of the procedure. A Moderate amount of bleeding was controlled with Silver Nitrate. The procedure was tolerated well with a pain level of 0 throughout and a pain level of 0 following the procedure. Post Debridement Measurements: 1.7cm length x 1cm width x 0.3cm depth; 0.401cm^3 volume. Plan Wound Cleansing: Wound #1 Left,Lateral,Plantar Foot: Cleanse wound with mild soap and water May Shower, gently pat wound dry prior to applying new dressing. May shower with protection. Primary Wound Dressing: Wound #1 Left,Lateral,Plantar Foot: Aquacel  Ag Drawtex Secondary Dressing: Wound #1 Left,Lateral,Plantar Foot: Gauze and Kerlix/Conform Dressing Change Frequency: Wound #1 Left,Lateral,Plantar Foot: Change dressing every day. Follow-up Appointments: Wound #1 Left,Lateral,Plantar Foot: Return Appointment in 1 week. Off-Loading: Wound #1 Left,Lateral,Plantar Foot: Open toe surgical shoe with peg assist. Oncale, [name] ([id number]) Electronic Signature(s) Signed: 01/18/2015 5:09:49 PM By: [name] MD Signed: 01/22/2015 12:31:06 PM By: [name] MD, FACS Previous Signature: 01/18/2015 4:51:53 PM Version By: [name] MD Entered By: [name] on 01/18/2015 17:09:49 Horiuchi, [name] ([id number]) -------------------------------------------------------------------------------- SuperBill Details Patient Name: Kunst, Nishka M. Date of Service: 01/18/2015 Medical Record Number: [id number] Patient Account Number: [id number] Date of Birth/Sex: 06-13-1976 (39 y.o. Female) Treating RN: Primary Care Physician: [name] Other Clinician: Referring Physician: [name] Treating Physician/Extender: [name] in Treatment: 7 Diagnosis Coding ICD-10 Codes Code Description E11.621 Type 2 diabetes mellitus with foot ulcer E08.40  Diabetes mellitus due to underlying condition with diabetic neuropathy, unspecified L97.513 Non-pressure chronic ulcer of other part of right foot with necrosis of muscle F17.218 Nicotine dependence, cigarettes, with other nicotine-induced disorders E66.01 Morbid (severe) obesity due to excess calories Facility Procedures CPT4 Code: 40981191 Description: 11042 - DEB SUBQ TISSUE 20 SQ CM/< ICD-10 Description Diagnosis E66.01 Morbid (severe) obesity due to excess calories Modifier: Quantity: 1 Physician Procedures CPT4 Code: [id number] Description: 21308 - WC PHYS LEVEL 2 - EST PT ICD-10 Description Diagnosis E11.621 Type 2 diabetes mellitus with foot  ulcer Modifier: Quantity: 1 CPT4 Code: [id number] Description: 11042 - WC PHYS SUBQ TISS 20 SQ CM ICD-10 Description Diagnosis E66.01 Morbid (severe) obesity due to excess calories Modifier: Quantity: 1 Electronic Signature(s) Signed: 01/18/2015 5:10:29 PM By: [name] MD Signed: 01/22/2015 12:31:06 PM By: [name] MD, FACS Entered By: [name] on 01/18/2015 17:10:29

## 2015-01-26 ENCOUNTER — Encounter: Payer: Medicaid Other | Attending: Surgery | Admitting: Surgery

## 2015-01-26 DIAGNOSIS — L97513 Non-pressure chronic ulcer of other part of right foot with necrosis of muscle: Secondary | ICD-10-CM | POA: Insufficient documentation

## 2015-01-26 DIAGNOSIS — E11621 Type 2 diabetes mellitus with foot ulcer: Secondary | ICD-10-CM | POA: Diagnosis not present

## 2015-01-26 DIAGNOSIS — Z794 Long term (current) use of insulin: Secondary | ICD-10-CM | POA: Diagnosis not present

## 2015-01-26 DIAGNOSIS — I10 Essential (primary) hypertension: Secondary | ICD-10-CM | POA: Diagnosis not present

## 2015-01-26 DIAGNOSIS — F17210 Nicotine dependence, cigarettes, uncomplicated: Secondary | ICD-10-CM | POA: Diagnosis not present

## 2015-01-26 DIAGNOSIS — E1140 Type 2 diabetes mellitus with diabetic neuropathy, unspecified: Secondary | ICD-10-CM | POA: Insufficient documentation

## 2015-01-26 DIAGNOSIS — R600 Localized edema: Secondary | ICD-10-CM | POA: Insufficient documentation

## 2015-01-27 NOTE — Progress Notes (Signed)
CHERINE, DRUMGOOLE ([id number]) Visit Report for 01/26/2015 Chief Complaint Document Details Patient Name: [name] [name]. Date of Service: 01/26/2015 3:00 PM Medical Record Patient Account Number: [id number] Number: Afful, RN, BSN, Treating RN: 23-Aug-1975 (39 y.o. [place] Sink Date of Birth/Sex: Female) Other Clinician: Primary Care Physician: [name] Treating [name] Referring Physician: [name] Physician/Extender: [name] in Treatment: 8 Information Obtained from: Patient Chief Complaint Patients presents for treatment of an open diabetic ulcer. The patient has had an ulcerated wound on her left lower extremity for about 2 years. Electronic Signature(s) Signed: 01/26/2015 2:31:41 PM By: [name] MD, FACS Entered By: [name] on 01/26/2015 14:31:40 [name] [name] ([id number]) -------------------------------------------------------------------------------- Debridement Details Patient Name: [name] [name] M. Date of Service: 01/26/2015 3:00 PM Medical Record Patient Account Number: [id number] Number: Afful, RN, BSN, Treating RN: December 25, 1975 (39 y.o. [place] Sink Date of Birth/Sex: Female) Other Clinician: Primary Care Physician: [name] Treating Quadasia Newsham Referring Physician: [name] Physician/Extender: [name] in Treatment: 8 Debridement Performed for Wound #1 Left,Lateral,Plantar Foot Assessment: Performed By: Physician Tristan Schroeder., MD Debridement: Debridement Pre-procedure Yes Verification/Time Out Taken: Start Time: 14:15 Pain Control: Lidocaine 5% topical ointment Level: Skin/Subcutaneous Tissue Total Area Debrided (L x 1.5 (cm) x 0.8 (cm) = 1.2 (cm) W): Tissue and other Viable, Non-Viable, Callus, Exudate, Fibrin/Slough, Subcutaneous material debrided: Instrument: Curette Bleeding: Minimum Hemostasis Achieved: Pressure End Time: 14:20 Procedural Pain: 0 Post Procedural Pain: 0 Response to  Treatment: Procedure was tolerated well Post Debridement Measurements of Total Wound Length: (cm) 1.5 Width: (cm) 0.8 Depth: (cm) 0.3 Volume: (cm) 0.283 Electronic Signature(s) Signed: 01/26/2015 2:31:34 PM By: [name] MD, FACS Signed: 01/26/2015 3:31:27 PM By: Elpidio Eric BSN, RN Entered By: [name] on 01/26/2015 14:31:34 [name] [name] ([id number]) -------------------------------------------------------------------------------- HPI Details Patient Name: [name], [name] Date of Service: 01/26/2015 3:00 PM Medical Record Patient Account Number: [id number] Number: Afful, RN, BSN, Treating RN: [birth year] (39 y.o. [place] Sink Date of Birth/Sex: Female) Other Clinician: Primary Care Physician: [name] Treating [name] Referring Physician: [name] Physician/Extender: [name] in Treatment: 8 History of Present Illness Location: left foot ulcer Quality: Patient reports No Pain. Severity: Wound is moderately severed with some exposed structure in the wound bed. Duration: Patient has had the wound for > 2 years prior to seeking treatment at the wound center Timing: for the last 2 years she has been treated at the [facility] wound care center Context: The wound appeared gradually over time Modifying Factors: Other treatment(s) tried include:in the last month she has been operated on the left foot for an abscess of that foot and was in hospital for 2 weeks. Associated Signs and Symptoms: Periwound excoriated and wound draining heavily HPI Description: 39 year old patient who is known to be a diabetic comes from the office of Dr. Dario Guardian for a ulcer on the left lower extremity and foot. She has had an ulcer on the left foot and has had previous treatment at [facility]. the patient tells [place] that she has been seen as the outpatient center for a long while and has had various tests including MRIs done but she is not a very good historian. Most recently about a month  ago she had a abscess drained from her left foot and was in hospital for about 2 weeks and was then discharged with a PICC line and IV antibioticstics which she knows may be vancomycin and Levaquin. She has never had a wound VAC now has she had hyperbaric oxygen therapy. Past medical  history significant for dyspnea, hypertension, hyperlipidemia, neuropathy, diabetes type 2 uncontrolled. She smokes cigarettes the latest blood reports are from March 2016 and show that her BMP is essentially normal her lipid panel was also normal her hemoglobin A1c was 9.4. 12/07/2014 -- Her tissue culture is back and she has grown significant amounts of Proteus mirabilis, pseudomonas aeruginosa and Staphylococcus aureus. The staph is a MSSA and all the cultures are sensitive to ciprofloxacin. I will start her on Cipro 500 mg by mouth twice a day for 14 days. Xray left foot -- IMPRESSION:No evidence of osteomyelitis. Healed fracture of the distal left third metatarsal. 12/21/2014 -- she is still awaiting clearance from her insurance company for HBOT. She was seen by Dr. Dario Guardian was working on controlling her diabetes and she continues to work on giving up smoking. She has completed her dosage of ciprofloxacin and we will be continuing with local care. 01/03/2015 -- her insurance clearance is still not in and we are awaiting this before starting her on a hyperbaric oxygen therapy. 01/09/2015 - we are still awaiting her insurance clearance and other than that she is doing fine. 01/26/2015 -- she was unable to tolerate the total contact cast and hence had a Darco shoe back on her left [name], [name] ([id number]) foot. Her blood sugars have been doing better and her last hemoglobin A1c report is pending. He continues to smoke 6-8 cigarettes a day. Electronic Signature(s) Signed: 01/26/2015 2:32:33 PM By: [name] MD, FACS Entered By: [name] on 01/26/2015 14:32:33 [name] [name]  ([id number]) -------------------------------------------------------------------------------- Physical Exam Details Patient Name: Benkert, Sakira M. Date of Service: 01/26/2015 3:00 PM Medical Record Patient Account Number: [id number] Number: Afful, RN, BSN, Treating RN: 01/08/1976 (38 y.o. [place] Sink Date of Birth/Sex: Female) Other Clinician: Primary Care Physician: [name] Treating [name] Referring Physician: [name] Physician/Extender: Weeks in Treatment: 8 Constitutional . Pulse regular. Respirations normal and unlabored. Afebrile. . Eyes Nonicteric. Reactive to light. Ears, Nose, Mouth, and Throat Lips, teeth, and gums WNL.Marland Kitchen Moist mucosa without lesions . Neck supple and nontender. No palpable supraclavicular or cervical adenopathy. Normal sized without goiter. Respiratory WNL. No retractions.. Cardiovascular Pedal Pulses WNL. No clubbing, cyanosis or edema. Lymphatic No adneopathy. No adenopathy. No adenopathy. Musculoskeletal Adexa without tenderness or enlargement.. Digits and nails w/o clubbing, cyanosis, infection, petechiae, ischemia, or inflammatory conditions.. Integumentary (Hair, Skin) No suspicious lesions. No crepitus or fluctuance. No peri-wound warmth or erythema. No masses.Marland Kitchen Psychiatric Judgement and insight Intact.. No evidence of depression, anxiety, or agitation.. Notes the wound looks clean and there is some debris and callus around it which need sharp debridement with a curette Electronic Signature(s) Signed: 01/26/2015 2:33:12 PM By: [name] MD, FACS Entered By: [name] on 01/26/2015 14:33:11 [name] [name] ([id number]) -------------------------------------------------------------------------------- Physician Orders Details Patient Name: [name] [name] M. Date of Service: 01/26/2015 3:00 PM Medical Record Number: [id number] Patient Account Number: [id number] Date of Birth/Sex: 1975-07-14 (39 y.o.  Female) Treating RN: Huel Coventry Primary Care Physician: [name] Other Clinician: Referring Physician: [name] Treating Physician/Extender: [name] in Treatment: 8 Verbal / Phone Orders: Yes Clinician: Huel Coventry Read Back and Verified: Yes Diagnosis Coding Wound Cleansing Wound #1 Left,Lateral,Plantar Foot o Clean wound with Normal Saline. Anesthetic Wound #1 Left,Lateral,Plantar Foot o Topical Lidocaine 4% cream applied to wound bed prior to debridement Primary Wound Dressing Wound #1 Left,Lateral,Plantar Foot o Aquacel Ag - Felt around wound Dressing Change Frequency Wound #1 Left,Lateral,Plantar Foot o Change dressing every other  day. Follow-up Appointments Wound #1 Left,Lateral,Plantar Foot o Return Appointment in 1 week. Off-Loading Wound #1 Left,Lateral,Plantar Foot o Other: - front off loader Electronic Signature(s) Signed: 01/26/2015 3:10:43 PM By: [name] MD, FACS Signed: 01/26/2015 4:15:34 PM By: Elliot Gurney, RN, BSN, Kim RN, BSN Entered By: Elliot Gurney, RN, BSN, Kim on 01/26/2015 14:22:16 [place], [name] [name] ([id number]) -------------------------------------------------------------------------------- Problem List Details Patient Name: [name] [name] M. Date of Service: 01/26/2015 3:00 PM Medical Record Number: [id number] Patient Account Number: [id number] Date of Birth/Sex: Aug 27, 1975 (39 y.o. 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 Date of Service: 01/26/2015 3:00 PM Medical Record Patient Account Number: [id number] Number: Afful, RN, BSN, Treating RN: October 07, 1975 (38 y.o. [place] Sink Date of Birth/Sex: Female) Other Clinician: Primary Care Physician: [name] Treating [name] Referring Physician: [name] Physician/Extender: [name] in Treatment: 8 Subjective Chief Complaint Information obtained from Patient Patients presents for treatment of an open diabetic ulcer. The patient has had an ulcerated wound on her left lower extremity for about 2 years. History of Present Illness (HPI) The following HPI elements were documented for the patient's wound: Location: left foot ulcer Quality: Patient reports No Pain. Severity: Wound is moderately severed with some exposed structure in the wound bed. Duration: Patient has had the wound for > 2 years prior to seeking treatment at the wound center Timing: for the last 2 years she has been treated at the [facility] wound care center Context: The wound appeared gradually over time Modifying Factors: Other treatment(s) tried include:in the last month she has been operated on the left foot for an abscess of that foot and was in hospital for 2 weeks. Associated Signs and Symptoms: Periwound excoriated and wound draining heavily 40 year old patient who is known to be a diabetic comes from the office of Dr. Dario Guardian for a ulcer on the left lower extremity and foot. She has had an ulcer on the  left foot and has had previous treatment at [facility]. the patient tells [place] that she has been seen as the outpatient center for a long while and has had various tests including MRIs done but she is not a very good historian. Most recently about a month ago she had a abscess drained from her left foot and was in hospital for about 2 weeks and was then discharged with a PICC line and IV antibioticstics which she knows may be vancomycin and Levaquin. She has never had a wound VAC now has she had hyperbaric oxygen therapy. Past medical history significant for dyspnea, hypertension, hyperlipidemia, neuropathy, diabetes type 2 uncontrolled. She smokes cigarettes the latest blood reports are from March 2016 and show that her BMP is essentially normal her lipid panel was also normal her hemoglobin A1c was 9.4. 12/07/2014 -- Her tissue culture is back and she has grown significant amounts of  Proteus mirabilis, pseudomonas aeruginosa and Staphylococcus aureus. The staph is a MSSA and all the cultures are sensitive to ciprofloxacin. I will start her on Cipro 500 mg by mouth twice a day for 14 days. Xray left foot -- IMPRESSION:No evidence of osteomyelitis. Healed fracture of the distal left [name] [name] M. ([id number]) third metatarsal. 12/21/2014 -- she is still awaiting clearance from her insurance company for HBOT. She was seen by Dr. Dario Guardian was working on controlling her diabetes and she continues to work on giving up smoking. She has completed her dosage of ciprofloxacin and we will be continuing with local care. 01/03/2015 -- her insurance clearance is still not in and we are awaiting this before starting her on a hyperbaric oxygen therapy. 01/09/2015 - we are still awaiting her insurance clearance and other than that she is doing fine. 01/26/2015 -- she was unable to tolerate the total contact cast and hence had a Darco shoe back on her left foot. Her blood sugars have been doing better and her  last hemoglobin A1c report is pending. He continues to smoke 6-8 cigarettes a day. Objective Constitutional Pulse regular. Respirations normal and unlabored. Afebrile. Vitals Time Taken: 2:07 PM, Height: 67 in, Weight: 290 lbs, BMI: 45.4, Temperature: 98.2 F, Pulse: 91 bpm, Respiratory Rate: 18 breaths/min, Blood Pressure: 137/115 mmHg. Eyes Nonicteric. Reactive to light. Ears, Nose, Mouth, and Throat Lips, teeth, and gums WNL.Marland Kitchen Moist mucosa without lesions . Neck supple and nontender. No palpable supraclavicular or cervical adenopathy. Normal sized without goiter. Respiratory WNL. No retractions.. Cardiovascular Pedal Pulses WNL. No clubbing, cyanosis or edema. Lymphatic No adneopathy. No adenopathy. No adenopathy. Musculoskeletal Adexa without tenderness or enlargement.. Digits and nails w/o clubbing, cyanosis, infection, petechiae, ischemia, or inflammatory conditions.Marland Kitchen Psychiatric Judgement and insight Intact.. No evidence of depression, anxiety, or agitation.Marland Kitchen [place], [name] [name] ([id number]) General Notes: the wound looks clean and there is some debris and callus around it which need sharp debridement with a curette Integumentary (Hair, Skin) No suspicious lesions. No crepitus or fluctuance. No peri-wound warmth or erythema. No masses.. Wound #1 status is Open. Original cause of wound was Gradually Appeared. The wound is located on the Left,Lateral,Plantar Foot. The wound measures 1.5cm length x 0.8cm width x 0.3cm depth; 0.942cm^2 area and 0.283cm^3 volume. The wound is limited to skin breakdown. There is no tunneling or undermining noted. There is a large amount of serosanguineous drainage noted. The wound margin is thickened. There is medium (34-66%) pink granulation within the wound bed. There is a small (1-33%) amount of necrotic tissue within the wound bed including Adherent Slough. The periwound skin appearance exhibited: Callus, Moist. The periwound skin appearance  did not exhibit: Crepitus, Excoriation, Fluctuance, Friable, Induration, Localized Edema, Rash, Scarring, Dry/Scaly, Maceration, Atrophie Blanche, Cyanosis, Ecchymosis, Hemosiderin Staining, Mottled, Pallor, Rubor, Erythema. Periwound temperature was noted as No Abnormality. 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 Since she has been unable to tolerate a total contact cast will see if she will be covered for a DH walking boot. After several calls to the [name] service we may be able to get her authorized for hyperbaric  oxygen therapy and be anticipated may be able to start next week. Procedures Wound #1 Wound #1 is a Diabetic Wound/Ulcer of the Lower Extremity located on the Left,Lateral,Plantar Foot . There was a Skin/Subcutaneous Tissue Debridement ([id number]) debridement with total area of 1.2 sq cm [name] [name] M. ([id number]) performed by Tristan Schroeder., MD. with the following instrument(s): Curette to remove Viable and Non-Viable tissue/material including Exudate, Fibrin/Slough, Callus, and Subcutaneous after achieving pain control using Lidocaine 5% topical ointment. A time out was conducted prior to the start of the procedure. A Minimum amount of bleeding was controlled with Pressure. The procedure was tolerated well with a pain level of 0 throughout and a pain level of 0 following the procedure. Post Debridement Measurements: 1.5cm length x 0.8cm width x 0.3cm depth; 0.283cm^3 volume. Plan Wound Cleansing: Wound #1  Left,Lateral,Plantar Foot: Clean wound with Normal Saline. Anesthetic: Wound #1 Left,Lateral,Plantar Foot: Topical Lidocaine 4% cream applied to wound bed prior to debridement Primary Wound Dressing: Wound #1 Left,Lateral,Plantar Foot: Aquacel Ag - Felt around wound Dressing Change Frequency: Wound #1 Left,Lateral,Plantar Foot: Change dressing every other day. Follow-up Appointments: Wound #1 Left,Lateral,Plantar Foot: Return Appointment in 1 week. Off-Loading: Wound #1 Left,Lateral,Plantar Foot: Other: - front off loader We will continue with Aquacel Ag, a felt offloading and her doctor friend off loading shoe. Since she has been unable to tolerate a total contact cast will see if she will be covered for a DH walking boot. After several calls to the [facility] service we may be able to get her authorized for hyperbaric oxygen therapy and be anticipated may be able to start next week. Electronic Signature(s) Signed: 01/26/2015 2:34:35 PM By: [name] MD, FACS Entered By: [name] on 01/26/2015 14:34:35 [name] [name] ([id number]) [name] [name] ([id number]) -------------------------------------------------------------------------------- SuperBill Details Patient Name: Otting, Teyanna M. Date of Service: 01/26/2015 Medical Record Patient Account Number: [id number] Number: Afful, RN, BSN, Treating RN: 05-19-1976 (38 y.o. [place] Sink Date of Birth/Sex: Female) Other Clinician: Primary Care Physician: [name] Treating Anilah Huck Referring Physician: [name] Physician/Extender: [name] in Treatment: 8 Diagnosis Coding ICD-10 Codes Code Description E11.621 Type 2 diabetes mellitus with foot ulcer E08.40 Diabetes mellitus due to underlying condition with diabetic neuropathy, unspecified L97.513 Non-pressure chronic ulcer of other part of right foot with necrosis of muscle F17.218 Nicotine dependence, cigarettes, with other nicotine-induced  disorders E66.01 Morbid (severe) obesity due to excess calories Facility Procedures CPT4: Description Modifier Quantity Code 29528413 11042 - DEB SUBQ TISSUE 20 SQ CM/< 1 ICD-10 Description Diagnosis E11.621 Type 2 diabetes mellitus with foot ulcer E08.40 Diabetes mellitus due to underlying condition with diabetic neuropathy,  unspecified L97.513 Non-pressure chronic ulcer of other part of right foot with necrosis of muscle Physician Procedures CPT4: Description Modifier Quantity Code 2440102 11042 - WC PHYS SUBQ TISS 20 SQ CM 1 ICD-10 Description Diagnosis E11.621 Type 2 diabetes mellitus with foot ulcer E08.40 Diabetes mellitus due to underlying condition with diabetic neuropathy, unspecified  L97.513 Non-pressure chronic ulcer of other part of right foot with necrosis of muscle Hisaw, [name] [name] ([id number]) Electronic Signature(s) Signed: 01/26/2015 3:05:04 PM By: [name] MD, FACS Entered By: [name] on 01/26/2015 15:05:04

## 2015-01-27 NOTE — Progress Notes (Signed)
[name], [name] ([id number]) Visit Report for 01/26/2015 Arrival Information Details Patient Name: [name], [name] Date of Service: 01/26/2015 3:00 PM Medical Record Number: [id number] Patient Account Number: [id number] Date of Birth/Sex: 05-22-1976 (39 y.o. Female) Treating RN: Afful, RN, BSN, [place] Sink Primary Care Physician: [name] Other Clinician: Referring Physician: [name] Treating Physician/Extender: [name] in Treatment: 8 Visit Information History Since Last Visit Any new allergies or adverse reactions: No Patient Arrived: Ambulatory Had a fall or experienced change in No Arrival Time: 14:05 activities of daily living that may affect Accompanied By: self risk of falls: Transfer Assistance: None Signs or symptoms of abuse/neglect since last No Patient Identification Verified: Yes visito Secondary Verification Process Yes Hospitalized since last visit: No Completed: Has Dressing in Place as Prescribed: Yes Patient Has Alerts: Yes Has Footwear/Offloading in Place as Yes Patient Alerts: Type II Diabetic Prescribed: 11/30/2014 ABI: Left: Felt/Foam (L) 1.24 (R) Wedge 0.92 Shoe Pain Present Now: No Electronic Signature(s) Signed: 01/26/2015 3:31:27 PM By: Elpidio Eric BSN, RN Entered By: Elpidio Eric on 01/26/2015 14:07:25 Wenrich, [name] ([id number]) -------------------------------------------------------------------------------- Encounter Discharge Information Details Patient Name: [name], [name] Date of Service: 01/26/2015 3:00 PM Medical Record Number: [id number] Patient Account Number: [id number] Date of Birth/Sex: 05-29-1976 (39 y.o. Female) Treating RN: Clover Mealy, RN, BSN, [place] Sink Primary Care Physician: [name] Other Clinician: Referring Physician: [name] Treating Physician/Extender: [name] in Treatment: 8 Encounter Discharge Information Items Discharge Pain Level: 0 Discharge Condition:  Stable Ambulatory Status: Ambulatory Discharge Destination: Home Private Transportation: Auto Accompanied By: self Schedule Follow-up Appointment: No Medication Reconciliation completed and No provided to Patient/Care Tykeisha Peer: Clinical Summary of Care: Electronic Signature(s) Signed: 01/26/2015 3:31:27 PM By: Elpidio Eric BSN, RN Entered By: Elpidio Eric on 01/26/2015 14:27:41 Storlie, [name] ([id number]) -------------------------------------------------------------------------------- Lower Extremity Assessment Details Patient Name: [name], [name] Date of Service: 01/26/2015 3:00 PM Medical Record Number: [id number] Patient Account Number: [id number] Date of Birth/Sex: 1975/10/24 (39 y.o. Female) Treating RN: Afful, RN, BSN, [place] Sink Primary Care Physician: [name] Other Clinician: Referring Physician: [name] Treating Physician/Extender: [name] in Treatment: 8 Vascular Assessment Pulses: Posterior Tibial Dorsalis Pedis Palpable: [Left:Yes] Extremity colors, hair growth, and conditions: Extremity Color: [Left:Normal] Hair Growth on Extremity: [Left:Yes] Temperature of Extremity: [Left:Warm] Capillary Refill: [Left:< 3 seconds] Toe Nail Assessment Left: Right: Thick: No Discolored: No Deformed: No Improper Length and Hygiene: No Electronic Signature(s) Signed: 01/26/2015 3:31:27 PM By: Elpidio Eric BSN, RN Entered By: Elpidio Eric on 01/26/2015 14:08:50 Risse, [name] ([id number]) -------------------------------------------------------------------------------- Multi Wound Chart Details Patient Name: [name], [name] Date of Service: 01/26/2015 3:00 PM Medical Record Number: [id number] Patient Account Number: [id number] Date of Birth/Sex: 24-May-1976 (39 y.o. Female) Treating RN: Huel Coventry Primary Care Physician: [name] Other Clinician: Referring Physician: [name] Treating Physician/Extender: [name] in  Treatment: 8 Vital Signs Height(in): 67 Pulse(bpm): 91 Weight(lbs): 290 Blood Pressure 137/115 (mmHg): Body Mass Index(BMI): 45 Temperature(F): 98.2 Respiratory Rate 18 (breaths/min): Photos: [1:No Photos] [N/A:N/A] Wound Location: [1:Left Foot - Plantar, Lateral N/A] Wounding Event: [1:Gradually Appeared] [N/A:N/A] Primary Etiology: [1:Diabetic Wound/Ulcer of N/A the Lower Extremity] Comorbid History: [1:Type II Diabetes] [N/A:N/A] Date Acquired: [1:11/21/2013] [N/A:N/A] Weeks of Treatment: [1:8] [N/A:N/A] Wound Status: [1:Open] [N/A:N/A] Measurements L x W x D 1.5x0.8x0.3 [N/A:N/A] (cm) Area (cm) : [1:0.942] [N/A:N/A] Volume (cm) : [1:0.283] [N/A:N/A] % Reduction in Area: [1:58.10%] [N/A:N/A] % Reduction in Volume: 86.00% [N/A:N/A] Classification: [1:Grade 3] [N/A:N/A] Wagner Verification: [1:Abscess] [N/A:N/A] Exudate Amount: [1:Large] [N/A:N/A] Exudate Type: [1:Serosanguineous] [  N/A:N/A] Exudate Color: [1:red, brown] [N/A:N/A] Foul Odor After [1:Yes] [N/A:N/A] Cleansing: Odor Anticipated Due to No [N/A:N/A] Product Use: Wound Margin: [1:Thickened] [N/A:N/A] Granulation Amount: [1:Medium (34-66%)] [N/A:N/A] Granulation Quality: [1:Pink] [N/A:N/A] Necrotic Amount: [1:Small (1-33%)] [N/A:N/A] Exposed Structures: [N/A:N/A] Fascia: No Fat: No Tendon: No Muscle: No Joint: No Bone: No Limited to Skin Breakdown Epithelialization: Small (1-33%) N/A N/A Periwound Skin Texture: Callus: Yes N/A N/A Edema: No Excoriation: No Induration: No Crepitus: No Fluctuance: No Friable: No Rash: No Scarring: No Periwound Skin Moist: Yes N/A N/A Moisture: Maceration: No Dry/Scaly: No Periwound Skin Color: Atrophie Blanche: No N/A N/A Cyanosis: No Ecchymosis: No Erythema: No Hemosiderin Staining: No Mottled: No Pallor: No Rubor: No Temperature: No Abnormality N/A N/A Tenderness on No N/A N/A Palpation: Wound Preparation: Ulcer Cleansing: N/A  N/A Rinsed/Irrigated with Saline Topical Anesthetic Applied: Other: lidocaine 4% Treatment Notes Electronic Signature(s) Signed: 01/26/2015 4:15:34 PM By: Elliot Gurney, RN, BSN, Kim RN, BSN Entered By: Elliot Gurney, RN, BSN, Kim on 01/26/2015 14:16:01 Gaines, [name] ([id number]) -------------------------------------------------------------------------------- Multi-Disciplinary Care Plan Details Patient Name: Nancarrow, MARDELLE PANDOLFI. Date of Service: 01/26/2015 3:00 PM Medical Record Number: [id number] Patient Account Number: [id number] Date of Birth/Sex: [birth year] (39 y.o. Female) Treating RN: Huel Coventry Primary Care Physician: [name] Other Clinician: Referring Physician: [name] Treating Physician/Extender: [name] in Treatment: 8 Active Inactive Nutrition Nursing Diagnoses: Potential for alteratiion in Nutrition/Potential for imbalanced nutrition Goals: Patient/caregiver agrees to and verbalizes understanding of need to use nutritional supplements and/or vitamins as prescribed Date Initiated: 11/30/2014 Goal Status: Active Interventions: Assess patient nutrition upon admission and as needed per policy Notes: Orientation to the Wound Care Program Nursing Diagnoses: Knowledge deficit related to the wound healing center program Goals: Patient/caregiver will verbalize understanding of the Wound Healing Center Program Date Initiated: 11/30/2014 Goal Status: Active Interventions: Provide education on orientation to the wound center Notes: Peripheral Neuropathy Nursing Diagnoses: Potential alteration in peripheral tissue perfusion (select prior to confirmation of diagnosis) Goals: Patient/caregiver will verbalize understanding of disease process and disease management Puerta, QUANTINA DERSHEM. ([id number]) Date Initiated: 11/30/2014 Goal Status: Active Interventions: Assess signs and symptoms of neuropathy upon admission and as needed Notes: Wound/Skin  Impairment Nursing Diagnoses: Impaired tissue integrity Knowledge deficit related to smoking impact on wound healing Goals: Ulcer/skin breakdown will have a volume reduction of 30% by week 4 Date Initiated: 11/30/2014 Goal Status: Active Interventions: Assess ulceration(s) every visit Notes: Electronic Signature(s) Signed: 01/26/2015 4:15:34 PM By: Elliot Gurney, RN, BSN, Kim RN, BSN Entered By: Elliot Gurney, RN, BSN, Kim on 01/26/2015 14:15:52 Ayer, [name] ([id number]) -------------------------------------------------------------------------------- Pain Assessment Details Patient Name: Vaile, [name] Date of Service: 01/26/2015 3:00 PM Medical Record Number: [id number] Patient Account Number: [id number] Date of Birth/Sex: May 05, 1976 (39 y.o. Female) Treating RN: Clover Mealy, RN, BSN, [place] Sink Primary Care Physician: [name] Other Clinician: Referring Physician: [name] Treating Physician/Extender: [name] in Treatment: 8 Active Problems Location of Pain Severity and Description of Pain Patient Has Paino No Site Locations Pain Management and Medication Current Pain Management: Electronic Signature(s) Signed: 01/26/2015 3:31:27 PM By: Elpidio Eric BSN, RN Entered By: Elpidio Eric on 01/26/2015 14:07:31 Delisa, [name] ([id number]) -------------------------------------------------------------------------------- Patient/Caregiver Education Details Patient Name: Mcpartland, [name] Date of Service: 01/26/2015 3:00 PM Medical Record Number: [id number] Patient Account Number: [id number] Date of Birth/Gender: 1975-07-02 (39 y.o. Female) Treating RN: Afful, RN, BSN, [place] Sink Primary Care Physician: [name] Other Clinician: Referring Physician: [name] Treating Physician/Extender: [name] in Treatment: 8 Education Assessment Education Provided To: Patient Education  Topics Provided Welcome To The Wound Care Center: Methods:  Explain/Verbal Responses: State content correctly Electronic Signature(s) Signed: 01/26/2015 3:31:27 PM By: Elpidio Eric BSN, RN Entered By: Elpidio Eric on 01/26/2015 14:27:50 Milhorn, [name] ([id number]) -------------------------------------------------------------------------------- Wound Assessment Details Patient Name: Pensabene, Jazara M. Date of Service: 01/26/2015 3:00 PM Medical Record Number: [id number] Patient Account Number: [id number] Date of Birth/Sex: 06/06/1976 (39 y.o. Female) Treating RN: Afful, RN, BSN, [place] Sink Primary Care Physician: [name] Other Clinician: Referring Physician: [name] Treating Physician/Extender: [name] in Treatment: 8 Wound Status Wound Number: 1 Primary Diabetic Wound/Ulcer of the Lower Etiology: Extremity Wound Location: Left Foot - Plantar, Lateral Wound Status: Open Wounding Event: Gradually Appeared Comorbid Type II Diabetes Date Acquired: 11/21/2013 History: Weeks Of Treatment: 8 Clustered Wound: No Photos Photo Uploaded By: Elpidio Eric on 01/26/2015 15:30:04 Wound Measurements Length: (cm) 1.5 Width: (cm) 0.8 Depth: (cm) 0.3 Area: (cm) 0.942 Volume: (cm) 0.283 % Reduction in Area: 58.1% % Reduction in Volume: 86% Epithelialization: Small (1-33%) Tunneling: No Undermining: No Wound Description Classification: Grade 3 Wagner Verification: Abscess Wound Margin: Thickened Exudate Amount: Large Exudate Type: Serosanguineous Exudate Color: red, brown Foul Odor After Cleansing: Yes Due to Product Use: No Wound Bed Granulation Amount: Medium (34-66%) Exposed Structure Granulation Quality: Pink Fascia Exposed: No Necrotic Amount: Small (1-33%) Fat Layer Exposed: No Thrun, Charrie M. ([id number]) Necrotic Quality: Adherent Slough Tendon Exposed: No Muscle Exposed: No Joint Exposed: No Bone Exposed: No Limited to Skin Breakdown Periwound Skin Texture Texture Color No Abnormalities Noted:  No No Abnormalities Noted: No Callus: Yes Atrophie Blanche: No Crepitus: No Cyanosis: No Excoriation: No Ecchymosis: No Fluctuance: No Erythema: No Friable: No Hemosiderin Staining: No Induration: No Mottled: No Localized Edema: No Pallor: No Rash: No Rubor: No Scarring: No Temperature / Pain Moisture Temperature: No Abnormality No Abnormalities Noted: No Dry / Scaly: No Maceration: No Moist: Yes Wound Preparation Ulcer Cleansing: Rinsed/Irrigated with Saline Topical Anesthetic Applied: Other: lidocaine 4%, Treatment Notes Wound #1 (Left, Lateral, Plantar Foot) 1. Cleansed with: Clean wound with Normal Saline 4. Dressing Applied: Aquacel Ag 5. Secondary Dressing Applied Gauze and Kerlix/Conform 6. Footwear/Offloading device applied Other footwear/offloading device applied (specify in notes) Notes Drawtex and foam. darco Electronic Signature(s) Signed: 01/26/2015 3:31:27 PM By: Elpidio Eric BSN, RN Entered By: Elpidio Eric on 01/26/2015 14:11:03 Rowser, [name] ([id number]) Obremski, [name] ([id number]) -------------------------------------------------------------------------------- Vitals Details Patient Name: Gosney, Jakerra M. Date of Service: 01/26/2015 3:00 PM Medical Record Number: [id number] Patient Account Number: [id number] Date of Birth/Sex: 03/28/1976 (39 y.o. Female) Treating RN: Afful, RN, BSN,  Sink Primary Care Physician: [name] Other Clinician: Referring Physician: [name] Treating Physician/Extender: [name] in Treatment: 8 Vital Signs Time Taken: 14:07 Temperature (F): 98.2 Height (in): 67 Pulse (bpm): 91 Weight (lbs): 290 Respiratory Rate (breaths/min): 18 Body Mass Index (BMI): 45.4 Blood Pressure (mmHg): 137/115 Reference Range: 80 - 120 mg / dl Electronic Signature(s) Signed: 01/26/2015 3:31:27 PM By: Elpidio Eric BSN, RN Entered By: Elpidio Eric on 01/26/2015 14:07:48

## 2015-02-02 ENCOUNTER — Encounter: Payer: Medicaid Other | Admitting: Surgery

## 2015-02-02 DIAGNOSIS — L97513 Non-pressure chronic ulcer of other part of right foot with necrosis of muscle: Secondary | ICD-10-CM | POA: Diagnosis not present

## 2015-02-03 NOTE — Progress Notes (Signed)
[name] [name] ([id number]) Visit Report for 02/02/2015 HBO Risk Assessment Details Patient Name: [name] [name] KEA. Date of Service: 02/02/2015 1:00 PM Medical Record Patient Account Number: [id number] Number: Afful, RN, BSN, Treating RN: 03-31-1976 (38 y.o. [name] [name] Date of Birth/Sex: Female) Other Clinician: Primary Care Physician: [name] Treating Britto, Errol Referring Physician: [name] Physician/Extender: [name] in Treatment: 9 HBO Risk Assessment Items Answer Barotrauma Risks: Upper Respiratory Infections No Prior Radiation Treatment to Head/Neck No Tracheostomy No Ear problems or surgery (otosclerosis)- Consider pressure equalization tubes No Sinus Problems, Sinus Obstruction No Pulmonary Risks: Currently seeing a pulmonologisto No Emphysema No Pneumothorax No Tuberculosis No Other lung problems (COPD with CO2 retention, lesions, surgery) -Refer to CPGs No Congestive heart Failure -Consider holding HBO if ejection fraction<30% No History of smoking Yes Bullous Disease, Blebs No Other pulmonary abnormalities No Cardiac Risks: Currently seeing a cardiologisto No Pacemaker/AICD No Hypertension Yes Diuretic Used (water pill). If yes, last time taken: No History of prior or current malignancy (Cancer) Surgery No Radiation therapy No Chemotherapy No Ophthalmic Risks: [name] [name]. ([id number]) Optic Neuritis No Cataracts No Myopia No Retinopathy or Retinal Detachment Surgery- Consider pressure equalization tubes No Confinement Anxiety Claustrophobia No Dialysis Dialysis No Any implants; medical or non-medical No Pregnancy No Diabetes if Yes for Diabetes: Insulin-Dependent HgbA1C within 3 months Yes Seizures Seizures No Currently using these medications: Aspirin Yes Digoxin (CHF patient) No Narcotics No Nitroprusside No Phenothiazine (Thorazine,etc.) No Prednisone or other steroids No Disulfiram (Antabuse) No Mafenide  Acetate (Sulfamylon-burn cream) No Amiodarone No Electronic Signature(s) Signed: 02/02/2015 1:22:34 PM By: Elpidio Eric BSN, RN Entered By: Elpidio Eric on 02/02/2015 13:22:33

## 2015-02-03 NOTE — Progress Notes (Addendum)
TASHEBA, HENSON ([id number]) Visit Report for 02/02/2015 Arrival Information Details Patient Name: [name], [name] Date of Service: 02/02/2015 1:00 PM Medical Record Number: [id number] Patient Account Number: [id number] Date of Birth/Sex: [birth year] (39 y.o. Female) Treating RN: Afful, RN, BSN, [place] Sink Primary Care Physician: [name] Other Clinician: Referring Physician: [name] Treating Physician/Extender: [name] in Treatment: 9 Visit Information History Since Last Visit Any new allergies or adverse reactions: No Patient Arrived: Ambulatory Had a fall or experienced change in No Arrival Time: 13:18 activities of daily living that may affect Accompanied By: self risk of falls: Transfer Assistance: None Signs or symptoms of abuse/neglect No Patient Identification Verified: Yes since last visito Secondary Verification Process Yes Hospitalized since last visit: No Completed: Has Dressing in Place as Prescribed: Yes Patient Has Alerts: Yes Has Footwear/Offloading in Place as Yes Patient Alerts: Type II Diabetic Prescribed: 11/30/2014 ABI: Left: Surgical Shoe with (L) 1.24 (R) Pressure Relief Insole 0.92 Pain Present Now: No Electronic Signature(s) Signed: 02/02/2015 1:19:00 PM By: Elpidio Eric BSN, RN Entered By: Elpidio Eric on 02/02/2015 13:18:59 Malinoski, [name] -------------------------------------------------------------------------------- Clinic Level of Care Assessment Details Patient Name: Fonseca, [name] Date of Service: 02/02/2015 1:00 PM Medical Record Number: [id number] Patient Account Number: [id number] Date of Birth/Sex: [birth year] (39 y.o. Female) Treating RN: Huel Coventry Primary Care Physician: [name] Other Clinician: Referring Physician: [name] Treating Physician/Extender: [name] in Treatment: 9 Clinic Level of Care Assessment Items TOOL 4 Quantity Score  - Use when only an  EandM is performed on FOLLOW-UP visit 0 ASSESSMENTS - Nursing Assessment / Reassessment  - Reassessment of Co-morbidities (includes updates in patient status) 0  - Reassessment of Adherence to Treatment Plan 0 ASSESSMENTS - Wound and Skin Assessment / Reassessment X - Simple Wound Assessment / Reassessment - one wound 1 5  - Complex Wound Assessment / Reassessment - multiple wounds 0  - Dermatologic / Skin Assessment (not related to wound area) 0 ASSESSMENTS - Focused Assessment  - Circumferential Edema Measurements - multi extremities 0  - Nutritional Assessment / Counseling / Intervention 0  - Lower Extremity Assessment (monofilament, tuning fork, pulses) 0  - Peripheral Arterial Disease Assessment (using hand held doppler) 0 ASSESSMENTS - Ostomy and/or Continence Assessment and Care  - Incontinence Assessment and Management 0  - Ostomy Care Assessment and Management (repouching, etc.) 0 PROCESS - Coordination of Care X - Simple Patient / Family Education for ongoing care 1 15  - Complex (extensive) Patient / Family Education for ongoing care 0 X - Staff obtains Chiropractor, Records, Test Results / Process Orders 1 10  - Staff telephones HHA, Nursing Homes / Clarify orders / etc 0  - Routine Transfer to another Facility (non-emergent condition) 0 Widmer, [name] ([id number])  - Routine Hospital Admission (non-emergent condition) 0  - New Admissions / Manufacturing engineer / Ordering NPWT, Apligraf, etc. 0  - Emergency Hospital Admission (emergent condition) 0 X - Simple Discharge Coordination 1 10  - Complex (extensive) Discharge Coordination 0 PROCESS - Special Needs  - Pediatric / Minor Patient Management 0  - Isolation Patient Management 0  - Hearing / Language / Visual special needs 0  - Assessment of Community assistance (transportation, D/C planning, etc.) 0  - Additional assistance / Altered mentation 0  - Support Surface(s)  Assessment (bed, cushion, seat, etc.) 0 INTERVENTIONS - Wound Cleansing / Measurement X - Simple Wound Cleansing - one wound 1 5  - Complex Wound Cleansing - multiple  wounds 0 []  - Wound Imaging (photographs - any number of wounds) 0 []  - Wound Tracing (instead of photographs) 0 X - Simple Wound Measurement - one wound 1 5 []  - Complex Wound Measurement - multiple wounds 0 INTERVENTIONS - Wound Dressings X - Small Wound Dressing one or multiple wounds 1 10 []  - Medium Wound Dressing one or multiple wounds 0 []  - Large Wound Dressing one or multiple wounds 0 []  - Application of Medications - topical 0 []  - Application of Medications - injection 0 INTERVENTIONS - Miscellaneous []  - External ear exam 0 Buskirk, [name] ([id number]) []  - Specimen Collection (cultures, biopsies, blood, body fluids, etc.) 0 []  - Specimen(s) / Culture(s) sent or taken to Lab for analysis 0 []  - Patient Transfer (multiple staff / Michiel Sites Lift / Similar devices) 0 []  - Simple Staple / Suture removal (25 or less) 0 []  - Complex Staple / Suture removal (26 or more) 0 []  - Hypo / Hyperglycemic Management (close monitor of Blood Glucose) 0 []  - Ankle / Brachial Index (ABI) - do not check if billed separately 0 X - Vital Signs 1 5 Has the patient been seen at the hospital within the last three years: Yes Total Score: 65 Level Of Care: New/Established - Level 2 Notes Patient came in today with cellulitis in addition to wound on foot we were already treating. Electronic Signature(s) Signed: 02/05/2015 6:31:19 PM By: Elliot Gurney, RN, BSN, Kim RN, BSN Entered By: Elliot Gurney, RN, BSN, Kim on 02/02/2015 13:40:33 Dirocco, [name] -------------------------------------------------------------------------------- Encounter Discharge Information Details Patient Name: Rackley, [name] Date of Service: 02/02/2015 1:00 PM Medical Record Number: [id number] Patient Account Number: [id number] Date of Birth/Sex:  14-Jul-1975 (39 y.o. Female) Treating RN: Clover Mealy, RN, BSN, [place] Sink Primary Care Physician: [name] Other Clinician: Referring Physician: [name] Treating Physician/Extender: [name] in Treatment: 9 Encounter Discharge Information Items Discharge Pain Level: 0 Discharge Condition: Stable Ambulatory Status: Ambulatory Discharge Destination: Home Transportation: Private Auto Accompanied By: self Schedule Follow-up Appointment: No Medication Reconciliation completed and provided to Patient/Care No Azadeh Hyder: Provided on Clinical Summary of Care: 02/02/2015 Form Type Recipient Paper Patient LS Electronic Signature(s) Signed: 02/02/2015 1:45:46 PM By: Gwenlyn Perking Previous Signature: 02/02/2015 1:45:06 PM Version By: Elpidio Eric BSN, RN Entered By: Gwenlyn Perking on 02/02/2015 13:45:45 Tetzlaff, [name] ([id number]) -------------------------------------------------------------------------------- Lower Extremity Assessment Details Patient Name: Wollenberg, Llana M. Date of Service: 02/02/2015 1:00 PM Medical Record Number: [id number] Patient Account Number: [id number] Date of Birth/Sex: 10-Apr-1976 (39 y.o. Female) Treating RN: Afful, RN, BSN, [place] Sink Primary Care Physician: [name] Other Clinician: Referring Physician: [name] Treating Physician/Extender: [name] in Treatment: 9 Edema Assessment Assessed: [Left: No] [Right: No] Edema: [Left: Ye] [Right: s] Vascular Assessment Pulses: Posterior Tibial Dorsalis Pedis Palpable: [Left:Yes] Extremity colors, hair growth, and conditions: Extremity Color: [Left:Normal] Hair Growth on Extremity: [Left:No] Temperature of Extremity: [Left:Warm] Capillary Refill: [Left:< 3 seconds] Toe Nail Assessment Left: Right: Thick: No Discolored: No Deformed: No Improper Length and Hygiene: No Electronic Signature(s) Signed: 02/02/2015 1:24:20 PM By: Elpidio Eric BSN, RN Previous Signature:  02/02/2015 1:23:00 PM Version By: Elpidio Eric BSN, RN Entered By: Elpidio Eric on 02/02/2015 13:24:20 Fusco, [name] ([id number]) -------------------------------------------------------------------------------- Multi Wound Chart Details Patient Name: Jolliffe, Karena M. Date of Service: 02/02/2015 1:00 PM Medical Record Number: [id number] Patient Account Number: [id number] Date of Birth/Sex: [birth year] (38 y.o. Female) Treating RN: Huel Coventry Primary Care Physician: [name] Other Clinician: Referring Physician: [name] Treating Physician/Extender: [name]  Weeks in Treatment: 9 Vital Signs Height(in): 67 Pulse(bpm): 115 Weight(lbs): 290 Blood Pressure 143/72 (mmHg): Body Mass Index(BMI): 45 Temperature(F): 99.3 Respiratory Rate 18 (breaths/min): Photos: [1:No Photos] [N/A:N/A] Wound Location: [1:Left Foot - Plantar, Lateral N/A] Wounding Event: [1:Gradually Appeared] [N/A:N/A] Primary Etiology: [1:Diabetic Wound/Ulcer of N/A the Lower Extremity] Comorbid History: [1:Type II Diabetes] [N/A:N/A] Date Acquired: [1:11/21/2013] [N/A:N/A] Weeks of Treatment: [1:9] [N/A:N/A] Wound Status: [1:Open] [N/A:N/A] Measurements L x W x D 1.4x0.8x0.5 [N/A:N/A] (cm) Area (cm) : [1:0.88] [N/A:N/A] Volume (cm) : [1:0.44] [N/A:N/A] % Reduction in Area: [1:60.80%] [N/A:N/A] % Reduction in Volume: 78.20% [N/A:N/A] Classification: [1:Grade 3] [N/A:N/A] Wagner Verification: [1:Abscess] [N/A:N/A] Exudate Amount: [1:Large] [N/A:N/A] Exudate Type: [1:Serosanguineous] [N/A:N/A] Exudate Color: [1:red, brown] [N/A:N/A] Foul Odor After [1:Yes] [N/A:N/A] Cleansing: Odor Anticipated Due to No [N/A:N/A] Product Use: Wound Margin: [1:Thickened] [N/A:N/A] Granulation Amount: [1:Medium (34-66%)] [N/A:N/A] Granulation Quality: [1:Pink] [N/A:N/A] Necrotic Amount: [1:Small (1-33%)] [N/A:N/A] Exposed Structures: [N/A:N/A] Fascia: No Fat: No Tendon: No Muscle: No Joint:  No Bone: No Limited to Skin Breakdown Epithelialization: Small (1-33%) N/A N/A Periwound Skin Texture: Edema: Yes N/A N/A Callus: Yes Excoriation: No Induration: No Crepitus: No Fluctuance: No Friable: No Rash: No Scarring: No Periwound Skin Moist: Yes N/A N/A Moisture: Maceration: No Dry/Scaly: No Periwound Skin Color: Erythema: Yes N/A N/A Atrophie Blanche: No Cyanosis: No Ecchymosis: No Hemosiderin Staining: No Mottled: No Pallor: No Rubor: No Erythema Location: Red Streaks N/A N/A Temperature: No Abnormality N/A N/A Tenderness on No N/A N/A Palpation: Wound Preparation: Ulcer Cleansing: N/A N/A Rinsed/Irrigated with Saline Topical Anesthetic Applied: Other: lidocaine 4% Treatment Notes Electronic Signature(s) Signed: 02/05/2015 6:31:19 PM By: Elliot Gurney, RN, BSN, Kim RN, BSN Entered By: Elliot Gurney, RN, BSN, Kim on 02/02/2015 13:34:45 Grounds, [name] ([id number]) -------------------------------------------------------------------------------- Multi-Disciplinary Care Plan Details Patient Name: Norlander, [name] VINJE. Date of Service: 02/02/2015 1:00 PM Medical Record Number: [id number] Patient Account Number: [id number] Date of Birth/Sex: 02-24-1976 (39 y.o. Female) Treating RN: Huel Coventry Primary Care Physician: [name] Other Clinician: Referring Physician: [name] Treating Physician/Extender: [name] in Treatment: 9 Active Inactive Nutrition Nursing Diagnoses: Potential for alteratiion in Nutrition/Potential for imbalanced nutrition Goals: Patient/caregiver agrees to and verbalizes understanding of need to use nutritional supplements and/or vitamins as prescribed Date Initiated: 11/30/2014 Goal Status: Active Interventions: Assess patient nutrition upon admission and as needed per policy Notes: Orientation to the Wound Care Program Nursing Diagnoses: Knowledge deficit related to the wound healing center  program Goals: Patient/caregiver will verbalize understanding of the Wound Healing Center Program Date Initiated: 11/30/2014 Goal Status: Active Interventions: Provide education on orientation to the wound center Notes: Peripheral Neuropathy Nursing Diagnoses: Potential alteration in peripheral tissue perfusion (select prior to confirmation of diagnosis) Goals: Patient/caregiver will verbalize understanding of disease process and disease management [place], [name] ZULAUF. ([id number]) Date Initiated: 11/30/2014 Goal Status: Active Interventions: Assess signs and symptoms of neuropathy upon admission and as needed Notes: Wound/Skin Impairment Nursing Diagnoses: Impaired tissue integrity Knowledge deficit related to smoking impact on wound healing Goals: Ulcer/skin breakdown will have a volume reduction of 30% by week 4 Date Initiated: 11/30/2014 Goal Status: Active Interventions: Assess ulceration(s) every visit Notes: Electronic Signature(s) Signed: 02/05/2015 6:31:19 PM By: Elliot Gurney, RN, BSN, Kim RN, BSN Entered By: Elliot Gurney, RN, BSN, Kim on 02/02/2015 13:34:37 Brackins, [name] ([id number]) -------------------------------------------------------------------------------- Pain Assessment Details Patient Name: Giancola, [name] Date of Service: 02/02/2015 1:00 PM Medical Record Number: [id number] Patient Account Number: [id number] Date of Birth/Sex: Dec 06, 1975 (39 y.o. Female) Treating RN: Afful, RN, BSN, [facility] Primary Care Physician:  [name] Other Clinician: Referring Physician: [name] Treating Physician/Extender: [name] in Treatment: 9 Active Problems Location of Pain Severity and Description of Pain Patient Has Paino No Site Locations Pain Management and Medication Current Pain Management: Electronic Signature(s) Signed: 02/02/2015 1:19:07 PM By: Elpidio Eric BSN, RN Entered By: Elpidio Eric on 02/02/2015 13:19:07 Cantero, [name]  ([id number]) -------------------------------------------------------------------------------- Patient/Caregiver Education Details Patient Name: Stanzione, [name] Date of Service: 02/02/2015 1:00 PM Medical Record Number: [id number] Patient Account Number: [id number] Date of Birth/Gender: 1975/10/21 (39 y.o. Female) Treating RN: Afful, RN, BSN, [place] Sink Primary Care Physician: [name] Other Clinician: Referring Physician: [name] Treating Physician/Extender: [name] in Treatment: 9 Education Assessment Education Provided To: Patient Education Topics Provided Welcome To The Wound Care Center: Methods: Explain/Verbal Responses: State content correctly Electronic Signature(s) Signed: 02/02/2015 1:45:14 PM By: Elpidio Eric BSN, RN Entered By: Elpidio Eric on 02/02/2015 13:45:14 [place], [name] -------------------------------------------------------------------------------- Wound Assessment Details Patient Name: Mwangi, [name] Date of Service: 02/02/2015 1:00 PM Medical Record Number: [id number] Patient Account Number: [id number] Date of Birth/Sex: 03-Oct-1975 (39 y.o. Female) Treating RN: Afful, RN, BSN, [place] Sink Primary Care Physician: [name] Other Clinician: Referring Physician: [name] Treating Physician/Extender: [name] in Treatment: 9 Wound Status Wound Number: 1 Primary Diabetic Wound/Ulcer of the Lower Etiology: Extremity Wound Location: Left Foot - Plantar, Lateral Wound Status: Open Wounding Event: Gradually Appeared Comorbid Type II Diabetes Date Acquired: 11/21/2013 History: Weeks Of Treatment: 9 Clustered Wound: No Photos Photo Uploaded By: Elpidio Eric on 02/02/2015 16:17:09 Wound Measurements Length: (cm) 1.4 Width: (cm) 0.8 Depth: (cm) 0.5 Area: (cm) 0.88 Volume: (cm) 0.44 % Reduction in Area: 60.8% % Reduction in Volume: 78.2% Epithelialization: Small (1-33%) Tunneling: No Undermining:  No Wound Description Classification: Grade 3 Wagner Verification: Abscess Wound Margin: Thickened Exudate Amount: Large Exudate Type: Serosanguineous Exudate Color: red, brown Foul Odor After Cleansing: Yes Due to Product Use: No Wound Bed Granulation Amount: Medium (34-66%) Exposed Structure Granulation Quality: Pink Fascia Exposed: No Necrotic Amount: Small (1-33%) Fat Layer Exposed: No Mirsky, [name] ([id number]) Necrotic Quality: Adherent Slough Tendon Exposed: No Muscle Exposed: No Joint Exposed: No Bone Exposed: No Limited to Skin Breakdown Periwound Skin Texture Texture Color No Abnormalities Noted: No No Abnormalities Noted: No Callus: Yes Atrophie Blanche: No Crepitus: No Cyanosis: No Excoriation: No Ecchymosis: No Fluctuance: No Erythema: Yes Friable: No Erythema Location: Red Streaks Induration: No Hemosiderin Staining: No Localized Edema: Yes Mottled: No Rash: No Pallor: No Scarring: No Rubor: No Moisture Temperature / Pain No Abnormalities Noted: No Temperature: No Abnormality Dry / Scaly: No Maceration: No Moist: Yes Wound Preparation Ulcer Cleansing: Rinsed/Irrigated with Saline Topical Anesthetic Applied: Other: lidocaine 4%, Treatment Notes Wound #1 (Left, Lateral, Plantar Foot) 1. Cleansed with: Clean wound with Normal Saline 4. Dressing Applied: Aquacel Ag 5. Secondary Dressing Applied Gauze and Kerlix/Conform Notes Drawtex and foam. darco Electronic Signature(s) Signed: 02/02/2015 1:28:23 PM By: Elpidio Eric BSN, RN Entered By: Elpidio Eric on 02/02/2015 13:28:23 Pinales, [name] ([id number]) -------------------------------------------------------------------------------- Vitals Details Patient Name: Goetze, [name] Date of Service: 02/02/2015 1:00 PM Medical Record Number: [id number] Patient Account Number: [id number] Date of Birth/Sex: [birth year] (39 y.o. Female) Treating RN: Afful, RN, BSN, [place] Sink Primary Care  Physician: [name] Other Clinician: Referring Physician: [name] Treating Physician/Extender: [name] in Treatment: 9 Vital Signs Time Taken: 13:18 Temperature (F): 99.3 Height (in): 67 Pulse (bpm): 115 Weight (lbs): 290 Respiratory Rate (breaths/min): 18 Body Mass Index (BMI): 45.4  Blood Pressure (mmHg): 143/72 Reference Range: 80 - 120 mg / dl Electronic Signature(s) Signed: 02/02/2015 1:19:30 PM By: Elpidio Eric BSN, RN Entered By: Elpidio Eric on 02/02/2015 13:19:30

## 2015-02-06 NOTE — Progress Notes (Signed)
[name], [name] ([id number]) Visit Report for 02/02/2015 Chief Complaint Document Details Patient Name: [name], [name] Date of Service: 02/02/2015 1:00 PM Medical Record Patient Account Number: [id number] Number: Afful, RN, BSN, Treating RN: 07-27-1975 (38 y.o. [place] Sink Date of Birth/Sex: Female) Other Clinician: Primary Care Physician: [name] Treating [name] Referring Physician: [name] Physician/Extender: [name] in Treatment: 9 Information Obtained from: Patient Chief Complaint Patients presents for treatment of an open diabetic ulcer. The patient has had an ulcerated wound on her left lower extremity for about 2 years. Electronic Signature(s) Signed: 02/02/2015 1:43:24 PM By: [name] MD, FACS Entered By: [name] on 02/02/2015 13:43:24 Ciszek, [name] ([id number]) -------------------------------------------------------------------------------- Debridement Details Patient Name: [name], [name] Date of Service: 02/02/2015 1:00 PM Medical Record Patient Account Number: [id number] Number: Afful, RN, BSN, Treating RN: [birth year] (38 y.o. [place] Sink Date of Birth/Sex: Female) Other Clinician: Primary Care Physician: [name] Treating Dinora Hemm Referring Physician: [name] Physician/Extender: [name] in Treatment: 9 Debridement Performed for Wound #1 Left,Lateral,Plantar Foot Assessment: Performed By: Physician Tristan Schroeder., MD Debridement: Debridement Pre-procedure Yes Verification/Time Out Taken: Start Time: 13:35 Pain Control: Other : lidocaine 4% Level: Skin/Subcutaneous Tissue Total Area Debrided (L x 1.4 (cm) x 0.8 (cm) = 1.12 (cm) W): Tissue and other Viable, Non-Viable, Callus, Eschar, Fibrin/Slough, Subcutaneous material debrided: Instrument: Curette Bleeding: Minimum Hemostasis Achieved: Pressure End Time: 13:40 Procedural Pain: 0 Post Procedural Pain: 0 Response to Treatment:  Procedure was tolerated well Post Debridement Measurements of Total Wound Length: (cm) 1.4 Width: (cm) 0.8 Depth: (cm) 0.5 Volume: (cm) 0.44 Electronic Signature(s) Signed: 02/02/2015 1:43:08 PM By: [name] MD, FACS Signed: 02/02/2015 4:02:32 PM By: Elpidio Eric BSN, RN Entered By: [name] on 02/02/2015 13:43:08 Fullington, [name] ([id number]) -------------------------------------------------------------------------------- HPI Details Patient Name: [name], [name] Date of Service: 02/02/2015 1:00 PM Medical Record Patient Account Number: [id number] Number: Afful, RN, BSN, Treating RN: Nov 06, 1975 (38 y.o. [place] Sink Date of Birth/Sex: Female) Other Clinician: Primary Care Physician: [name] Treating [name] Referring Physician: [name] Physician/Extender: Weeks in Treatment: 9 History of Present Illness Location: left foot ulcer Quality: Patient reports No Pain. Severity: Wound is moderately severed with some exposed structure in the wound bed. Duration: Patient has had the wound for > 2 years prior to seeking treatment at the wound center Timing: for the last 2 years she has been treated at the [facility] wound care center Context: The wound appeared gradually over time Modifying Factors: Other treatment(s) tried include:in the last month she has been operated on the left foot for an abscess of that foot and was in hospital for 2 weeks. Associated Signs and Symptoms: Periwound excoriated and wound draining heavily HPI Description: 39 year old patient who is known to be a diabetic comes from the office of Dr. Dario Guardian for a ulcer on the left lower extremity and foot. She has had an ulcer on the left foot and has had previous treatment at [facility]. the patient tells [place] that she has been seen as the outpatient center for a long while and has had various tests including MRIs done but she is not a very good historian. Most recently about a month ago she had  a abscess drained from her left foot and was in hospital for about 2 weeks and was then discharged with a PICC line and IV antibioticstics which she knows may be vancomycin and Levaquin. She has never had a wound VAC now has she had hyperbaric oxygen therapy. Past medical  history significant for dyspnea, hypertension, hyperlipidemia, neuropathy, diabetes type 2 uncontrolled. She smokes cigarettes the latest blood reports are from March 2016 and show that her BMP is essentially normal her lipid panel was also normal her hemoglobin A1c was 9.4. 12/07/2014 -- Her tissue culture is back and she has grown significant amounts of Proteus mirabilis, pseudomonas aeruginosa and Staphylococcus aureus. The staph is a MSSA and all the cultures are sensitive to ciprofloxacin. I will start her on Cipro 500 mg by mouth twice a day for 14 days. Xray left foot -- IMPRESSION:No evidence of osteomyelitis. Healed fracture of the distal left third metatarsal. 12/21/2014 -- she is still awaiting clearance from her insurance company for HBOT. She was seen by Dr. Dario Guardian was working on controlling her diabetes and she continues to work on giving up smoking. She has completed her dosage of ciprofloxacin and we will be continuing with local care. 01/03/2015 -- her insurance clearance is still not in and we are awaiting this before starting her on a hyperbaric oxygen therapy. 01/09/2015 - we are still awaiting her insurance clearance and other than that she is doing fine. 01/26/2015 -- she was unable to tolerate the total contact cast and hence had a Darco shoe back on her left [name], [name] ([id number]) foot. Her blood sugars have been doing better and her last hemoglobin A1c report is pending. He continues to smoke 6-8 cigarettes a day. 02/02/2015 -- yesterday her left leg started getting swollen and has red streaks starting at the foot and going up to her knee. She has had a mild fever and has not gone to see  her PCP or [facility] where she normally gets her treatment for this. Electronic Signature(s) Signed: 02/02/2015 1:43:59 PM By: [name] MD, FACS Entered By: [name] on 02/02/2015 13:43:59 Schuyler, [name] ([id number]) -------------------------------------------------------------------------------- Physical Exam Details Patient Name: Day, Lorel M. Date of Service: 02/02/2015 1:00 PM Medical Record Patient Account Number: [id number] Number: Afful, RN, BSN, Treating RN: [birth year] (38 y.o. [place] Sink Date of Birth/Sex: Female) Other Clinician: Primary Care Physician: [name] Treating [name] Referring Physician: [name] Physician/Extender: Weeks in Treatment: 9 Constitutional . Pulse regular. Respirations normal and unlabored. Afebrile. . Eyes Nonicteric. Reactive to light. Ears, Nose, Mouth, and Throat Lips, teeth, and gums WNL.Marland Kitchen Moist mucosa without lesions . Neck supple and nontender. No palpable supraclavicular or cervical adenopathy. Normal sized without goiter. Respiratory WNL. No retractions.. Cardiovascular Pedal Pulses WNL. she has got significant edema and cellulitis of the left lower extremity starting at the foot and going towards the knee. There is definite warmth and tenderness.. Chest Breasts symmetical and no nipple discharge.. Breast tissue WNL, no masses, lumps, or tenderness.. Lymphatic No adneopathy. No adenopathy. No adenopathy. Musculoskeletal Adexa without tenderness or enlargement.. Digits and nails w/o clubbing, cyanosis, infection, petechiae, ischemia, or inflammatory conditions.. Integumentary (Hair, Skin) No suspicious lesions. No crepitus or fluctuance. No peri-wound warmth or erythema. No masses.Marland Kitchen Psychiatric Judgement and insight Intact.. No evidence of depression, anxiety, or agitation.. Notes The actual wound has not changed in any way and I do not see any evidence of pus from the open  plantar ulcer. However she does have cellulitis of the lower extremity Electronic Signature(s) Signed: 02/02/2015 1:44:54 PM By: [name] MD, FACS Entered By: [name] on 02/02/2015 13:44:54 Eskridge, [name] ([id number]) -------------------------------------------------------------------------------- Physician Orders Details Patient Name: Mckenny, Kerrianne M. Date of Service: 02/02/2015 1:00 PM Medical Record Number: [id number] Patient Account Number: [id number] Date of  Birth/Sex: 1976-02-25 (38 y.o. Female) Treating RN: Huel Coventry Primary Care Physician: [name] Other Clinician: Referring Physician: [name] Treating Physician/Extender: [name] in Treatment: 9 Verbal / Phone Orders: Yes Clinician: Huel Coventry Read Back and Verified: Yes Diagnosis Coding Wound Cleansing Wound #1 Left,Lateral,Plantar Foot o Clean wound with Normal Saline. Anesthetic Wound #1 Left,Lateral,Plantar Foot o Topical Lidocaine 4% cream applied to wound bed prior to debridement Primary Wound Dressing Wound #1 Left,Lateral,Plantar Foot o Aquacel Ag - Felt around wound Secondary Dressing Wound #1 Left,Lateral,Plantar Foot o Gauze and Kerlix/Conform Dressing Change Frequency Wound #1 Left,Lateral,Plantar Foot o Change dressing every other day. Follow-up Appointments Wound #1 Left,Lateral,Plantar Foot o Return Appointment in 1 week. Off-Loading Wound #1 Left,Lateral,Plantar Foot o Other: - front off loader Hyperbaric Oxygen Therapy Wound #1 Left,Lateral,Plantar Foot o Evaluate for HBO Therapy o Indication: - Wag III o If appropriate for treatment, begin HBOT per protocol: o 2.0 ATA for [address] Minutes without Air Breaks Ziemann, Butner. ([id number]) o One treatment per day (delivered Monday through Friday unless otherwise specified in Special Instructions below): o Total # of Treatments: - 30 treatments HBO Contraindications Wound #1  Left,Lateral,Plantar Foot - Left Lower Extremity o HBO contraindications of hyperbaric oxygen therapy were reviewed and the patient found to have no untreated pneumothorax or history of spontaneous pneumothorax. o HBO contraindications of hyperbaric oxygen therapy were reviewed and the patient found to have no history of medications such as Bleomycin, Adriamycin, disulfiram, cisplatin and sulfamylon and is not currently receiving any chemotherapy. o HBO contraindications of hyperbaric oxygen therapy were reviewed and the patient found to have no Upper respiratory infection and chronic sinusitis. o HBO contraindications of hyperbaric oxygen therapy were reviewed and the patient found to have no history of retinal surgery proceeding 6 weeks or intraocular gas o HBO contraindications of hyperbaric oxygen therapy were reviewed and the patient found to have no history seizure disorder or any anticonvulsant medication. o HBO contraindications of hyperbaric oxygen therapy were reviewed and the patient found to have no septicemia with CO2 retention. o HBO contraindications of hyperbaric oxygen therapy were reviewed and the patient found to have no fever greater than 100 degrees. o HBO contraindications of hyperbaric oxygen therapy were reviewed and the patient found to have no pregnancy noted. o HBO contraindications of hyperbaric oxygen therapy were reviewed and the patient found to have no medications such as steroids or narcotics or Phenergan. Notes Patient advised to go to ED at [facility] for care of infection of lower leg. Electronic Signature(s) Signed: 02/02/2015 4:10:06 PM By: [name] MD, FACS Signed: 02/05/2015 6:31:19 PM By: Elliot Gurney RN, BSN, Kim RN, BSN Entered By: Elliot Gurney, RN, BSN, Kim on 02/02/2015 13:44:49 Diclemente, [name] ([id number]) -------------------------------------------------------------------------------- Problem List Details Patient Name: Dreier, Mattalyn  M. Date of Service: 02/02/2015 1:00 PM Medical Record Patient Account Number: [id number] Number: Afful, RN, BSN, Treating RN: [birth year] (38 y.o.  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 Date of Service: 02/02/2015 1:00 PM Medical Record Patient Account Number: [id number] Number: Afful, RN, BSN, Treating RN: 03/22/1976 (38 y.o. [place] Sink Date of Birth/Sex: Female) Other Clinician: Primary Care Physician: [name] Treating [name] Referring Physician: [name] Physician/Extender: [name] in Treatment: 9 Subjective Chief Complaint Information obtained from Patient Patients presents for treatment of an open diabetic ulcer. The patient has had an ulcerated wound on her left lower extremity for about 2 years. History of Present Illness (HPI) The following HPI elements were documented for the patient's wound: Location: left  foot ulcer Quality: Patient reports No Pain. Severity: Wound is moderately severed with some exposed structure in the wound bed. Duration: Patient has had the wound for > 2 years prior to seeking treatment at the wound center Timing: for the last 2 years she has been treated at the [facility] wound care center Context: The wound appeared gradually over time Modifying Factors: Other treatment(s) tried include:in the last month she has been operated on the left foot for an abscess of that foot and was in hospital for 2 weeks. Associated Signs and Symptoms: Periwound excoriated and wound draining heavily 39 year old patient who is known to be a diabetic comes from the office of Dr. Dario Guardian for a ulcer on the left lower extremity and foot. She has had an ulcer on the left foot and has had previous treatment at [facility]. the patient tells [place] that she has been seen as the outpatient center for a long while and has had various tests including MRIs done but she is not a very good historian. Most recently about a month ago she had a abscess drained from her left foot and was in hospital for about 2 weeks and was then discharged with a PICC line and IV antibioticstics which she knows may be vancomycin and Levaquin. She has never had a wound VAC now has she had hyperbaric oxygen therapy. Past medical history significant for dyspnea, hypertension, hyperlipidemia, neuropathy, diabetes type 2 uncontrolled. She smokes cigarettes the latest blood reports are from March 2016 and show that her BMP is essentially normal her lipid panel was also normal her hemoglobin A1c was 9.4. 12/07/2014 -- Her tissue culture is back and she has grown significant amounts of Proteus mirabilis, pseudomonas aeruginosa and Staphylococcus aureus. The staph is a MSSA and all the cultures are sensitive to ciprofloxacin. I will start her on Cipro 500 mg by mouth twice a day for 14 days. Xray left foot -- IMPRESSION:No evidence of osteomyelitis.  Healed fracture of the distal left Denicola, Rochell M. ([id number]) third metatarsal. 12/21/2014 -- she is still awaiting clearance from her insurance company for HBOT. She was seen by Dr. Dario Guardian was working on controlling her diabetes and she continues to work on giving up smoking. She has completed her dosage of ciprofloxacin and we will be continuing with local care. 01/03/2015 -- her insurance clearance is still not in and we are awaiting this before starting her on a hyperbaric oxygen therapy. 01/09/2015 - we are still awaiting her insurance clearance and other than that she is doing fine. 01/26/2015 -- she was unable to tolerate the total contact cast and hence had a Darco shoe back on her left foot. Her blood sugars have been doing better and her last hemoglobin A1c  report is pending. He continues to smoke 6-8 cigarettes a day. 02/02/2015 -- yesterday her left leg started getting swollen and has red streaks starting at the foot and going up to her knee. She has had a mild fever and has not gone to see her PCP or [facility] where she normally gets her treatment for this. Objective Constitutional Pulse regular. Respirations normal and unlabored. Afebrile. Vitals Time Taken: 1:18 PM, Height: 67 in, Weight: 290 lbs, BMI: 45.4, Temperature: 99.3 F, Pulse: 115 bpm, Respiratory Rate: 18 breaths/min, Blood Pressure: 143/72 mmHg. Eyes Nonicteric. Reactive to light. Ears, Nose, Mouth, and Throat Lips, teeth, and gums WNL.Marland Kitchen Moist mucosa without lesions . Neck supple and nontender. No palpable supraclavicular or cervical adenopathy. Normal sized without goiter. Respiratory WNL. No retractions.. Cardiovascular Pedal Pulses WNL. she has got significant edema and cellulitis of the left lower extremity starting at the foot and going towards the knee. There is definite warmth and tenderness.. Chest Breasts symmetical and no nipple discharge.. Breast tissue WNL, no masses, lumps, or  tenderness.. Lymphatic Armwood, Solenne M. ([id number]) No adneopathy. No adenopathy. No adenopathy. Musculoskeletal Adexa without tenderness or enlargement.. Digits and nails w/o clubbing, cyanosis, infection, petechiae, ischemia, or inflammatory conditions.Marland Kitchen Psychiatric Judgement and insight Intact.. No evidence of depression, anxiety, or agitation.. General Notes: The actual wound has not changed in any way and I do not see any evidence of pus from the open plantar ulcer. However she does have cellulitis of the lower extremity Integumentary (Hair, Skin) No suspicious lesions. No crepitus or fluctuance. No peri-wound warmth or erythema. No masses.. Wound #1 status is Open. Original cause of wound was Gradually Appeared. The wound is located on the Left,Lateral,Plantar Foot. The wound measures 1.4cm length x 0.8cm width x 0.5cm depth; 0.88cm^2 area and 0.44cm^3 volume. The wound is limited to skin breakdown. There is no tunneling or undermining noted. There is a large amount of serosanguineous drainage noted. The wound margin is thickened. There is medium (34-66%) pink granulation within the wound bed. There is a small (1-33%) amount of necrotic tissue within the wound bed including Adherent Slough. The periwound skin appearance exhibited: Callus, Localized Edema, Moist, Erythema. The periwound skin appearance did not exhibit: Crepitus, Excoriation, Fluctuance, Friable, Induration, Rash, Scarring, Dry/Scaly, Maceration, Atrophie Blanche, Cyanosis, Ecchymosis, Hemosiderin Staining, Mottled, Pallor, Rubor. The surrounding wound skin color is noted with erythema with red streaks. Periwound temperature was noted as No Abnormality. 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 She gets treated at [facility]. I have recommended that she go straight from here to her physicians at [facility] so that she could be admitted and Gunkel, Milcah M. ([id number]) appropriately treated with IV anti-buttocks and the necessary x-rays. Discussed with her that she has been finally approved for hyperbaric oxygen therapy and we would like to start this as soon as possible. She should come and see as as soon as she is discharged from  [facility]. She says she will be compliant. Procedures  Wound #1 Wound #1 is a Diabetic Wound/Ulcer of the Lower Extremity located on the Left,Lateral,Plantar Foot . There was a Skin/Subcutaneous Tissue Debridement ([id number]) debridement with total area of 1.12 sq cm performed by Iyanah Demont, Ignacia Felling., MD. with the following instrument(s): Curette to remove Viable and Non-Viable tissue/material including Fibrin/Slough, Eschar, Callus, and Subcutaneous after achieving pain control using Other (lidocaine 4%). A time out was conducted prior to the start of the procedure. A Minimum amount of bleeding was controlled with Pressure. The procedure was tolerated well with a pain level of 0 throughout and a pain level of 0 following the procedure. Post Debridement Measurements: 1.4cm length x 0.8cm width x 0.5cm depth; 0.44cm^3 volume. Plan Wound Cleansing: Wound #1 Left,Lateral,Plantar Foot: Clean wound with Normal Saline. Anesthetic: Wound #1 Left,Lateral,Plantar Foot: Topical Lidocaine 4% cream applied to wound bed prior to debridement Primary Wound Dressing: Wound #1 Left,Lateral,Plantar Foot: Aquacel Ag - Felt  around wound Secondary Dressing: Wound #1 Left,Lateral,Plantar Foot: Gauze and Kerlix/Conform Dressing Change Frequency: Wound #1 Left,Lateral,Plantar Foot: Change dressing every other day. Follow-up Appointments: Wound #1 Left,Lateral,Plantar Foot: Return Appointment in 1 week. Off-Loading: Wound #1 Left,Lateral,Plantar Foot: Other: - front off loader Hyperbaric Oxygen Therapy: Wound #1 Left,Lateral,Plantar Foot: Evaluate for HBO Therapy Indication: - Wag III If appropriate for treatment, begin HBOT per protocol: Coate, [name] ([id number]) 2.0 ATA for 90 Minutes without Air Breaks One treatment per day (delivered Monday through Friday unless otherwise specified in Special Instructions below): Total # of Treatments: - 30 treatments HBO Contraindications: Wound #1 Left,Lateral,Plantar Foot: HBO contraindications of hyperbaric oxygen therapy were reviewed and the patient found to have no untreated pneumothorax or history of spontaneous pneumothorax. HBO contraindications of hyperbaric oxygen therapy were reviewed and the patient found to have no history of medications such as Bleomycin, Adriamycin, disulfiram, cisplatin and sulfamylon and is not currently receiving any chemotherapy. HBO contraindications of hyperbaric oxygen therapy were reviewed and the patient found to have no Upper respiratory infection and chronic sinusitis. HBO contraindications of hyperbaric oxygen therapy were reviewed and the patient found to have no history of retinal surgery proceeding 6 weeks or intraocular gas HBO contraindications of hyperbaric oxygen therapy were reviewed and the patient found to have no history seizure disorder or any anticonvulsant medication. HBO contraindications of hyperbaric oxygen therapy were reviewed and the patient found to have no septicemia with CO2 retention. HBO contraindications of hyperbaric oxygen therapy were reviewed and the patient found to have no  fever greater than 100 degrees. HBO contraindications of hyperbaric oxygen therapy were reviewed and the patient found to have no pregnancy noted. HBO contraindications of hyperbaric oxygen therapy were reviewed and the patient found to have no medications such as steroids or narcotics or Phenergan. General Notes: Patient advised to go to ED at [facility] for care of infection of lower leg. The cellulitis of the left lower extremity is a new finding today and the patient says she's had several episodes like this in the past several years. She gets treated at [facility]. I have recommended that she go straight from here to her physicians at [facility] so that she could be admitted and appropriately treated with IV anti-buttocks and the necessary x-rays. Discussed with her that she has been finally approved for hyperbaric oxygen therapy and we would like to start this as soon as possible. She should come and see as as soon as she is discharged from [facility]. She says she will be compliant. Electronic Signature(s)  Signed: 02/02/2015 1:46:53 PM By: [name] MD, FACS Entered By: [name] on 02/02/2015 13:46:53 Osoria, [name] ([id number]) -------------------------------------------------------------------------------- SuperBill Details Patient Name: Ceballos, [name] Date of Service: 02/02/2015 Medical Record Patient Account Number: [id number] Number: Afful, RN, BSN, Treating RN: Dec 01, 1975 (38 y.o.  Sink Date of Birth/Sex: Female) Other Clinician: Primary Care Physician: [name] Treating Elias Dennington Referring Physician: [name] Physician/Extender: [name] in Treatment: 9 Diagnosis Coding ICD-10 Codes Code Description E11.621 Type 2 diabetes mellitus with foot ulcer E08.40 Diabetes mellitus due to underlying condition with diabetic neuropathy, unspecified L97.513 Non-pressure chronic ulcer of other part of right foot with necrosis of  muscle F17.218 Nicotine dependence, cigarettes, with other nicotine-induced disorders E66.01 Morbid (severe) obesity due to excess calories L03.116 Cellulitis of left lower limb Facility Procedures CPT4: Description Modifier Quantity Code [id number] 99212 - WOUND CARE VISIT-LEV 2 EST PT 1 CPT4: 62130865 11042 - DEB SUBQ TISSUE 20 SQ CM/< 1 ICD-10 Description Diagnosis E11.621 Type 2 diabetes mellitus with foot ulcer E08.40 Diabetes mellitus due to underlying condition with diabetic neuropathy, unspecified L03.116 Cellulitis of left lower  limb L97.513 Non-pressure chronic ulcer of other part of right foot with necrosis of muscle Physician Procedures CPT4: Description Modifier Quantity Code [id number] 99213 - WC PHYS LEVEL 3 - EST PT 1 ICD-10 Description Diagnosis E11.621 Type 2 diabetes mellitus with foot ulcer L03.116 Cellulitis of left lower limb L97.513 NORLEEN, XIE ([id number]) Electronic Signature(s) Signed: 02/02/2015 1:47:17 PM By: [name] MD, FACS Entered By: [name] on 02/02/2015 13:47:16

## 2015-02-15 ENCOUNTER — Encounter: Payer: Medicaid Other | Admitting: Surgery

## 2015-02-15 DIAGNOSIS — L97513 Non-pressure chronic ulcer of other part of right foot with necrosis of muscle: Secondary | ICD-10-CM | POA: Diagnosis not present

## 2015-02-15 NOTE — Progress Notes (Signed)
RENLEE, FLOOR ([id number]) Visit Report for 02/15/2015 Chief Complaint Document Details Patient Name: [name], [name] Date of Service: 02/15/2015 1:00 PM Medical Record Number: [id number] Patient Account Number: [id number] Date of Birth/Sex: 05-20-1976 (39 y.o. Female) Treating RN: Huel Coventry Primary Care Physician: [name] Other Clinician: Referring Physician: [name] Treating Physician/Extender: [name] in Treatment: 11 Information Obtained from: Patient Chief Complaint Patients presents for treatment of an open diabetic ulcer. The patient has had an ulcerated wound on her left lower extremity for about 2 years. Electronic Signature(s) Signed: 02/15/2015 1:39:00 PM By: [name] MD, FACS Entered By: [name] on 02/15/2015 13:38:59 Wingert, [name] ([id number]) -------------------------------------------------------------------------------- Debridement Details Patient Name: [name], [name] Date of Service: 02/15/2015 1:00 PM Medical Record Number: [id number] Patient Account Number: [id number] Date of Birth/Sex: 08/30/1975 (39 y.o. Female) Treating RN: Huel Coventry Primary Care Physician: [name] Other Clinician: Referring Physician: [name] Treating Physician/Extender: [name] in Treatment: 11 Debridement Performed for Wound #1 Left,Lateral,Plantar Foot Assessment: Performed By: Physician Tristan Schroeder., MD Debridement: Debridement Pre-procedure Yes Verification/Time Out Taken: Start Time: 13:25 Pain Control: Other : lidocaine 4% Level: Skin/Subcutaneous Tissue Total Area Debrided (L x 1.2 (cm) x 0.5 (cm) = 0.6 (cm) W): Tissue and other Viable, Non-Viable, Exudate, Fibrin/Slough, Skin, Subcutaneous material debrided: Instrument: Forceps, Scissors Bleeding: Minimum Hemostasis Achieved: Pressure End Time: 13:28 Procedural Pain: 0 Post Procedural Pain: 0 Response to Treatment: Procedure was  not tolerated well Post Debridement Measurements of Total Wound Length: (cm) 1.2 Width: (cm) 0.8 Depth: (cm) 1.5 Volume: (cm) 1.131 Post Procedure Diagnosis Same as Pre-procedure Electronic Signature(s) Signed: 02/15/2015 1:38:48 PM By: [name] MD, FACS Signed: 02/15/2015 3:28:23 PM By: Elliot Gurney RN, BSN, Kim RN, BSN Entered By: [name] on 02/15/2015 13:38:47 [name], [name] ([id number]) -------------------------------------------------------------------------------- HPI Details Patient Name: [name], [name] Date of Service: 02/15/2015 1:00 PM Medical Record Number: [id number] Patient Account Number: [id number] Date of Birth/Sex: Jul 09, 1975 (39 y.o. Female) Treating RN: Huel Coventry Primary Care Physician: [name] Other Clinician: Referring Physician: [name] Treating Physician/Extender: [name] in Treatment: 11 History of Present Illness Location: left foot ulcer Quality: Patient reports No Pain. Severity: Wound is moderately severed with some exposed structure in the wound bed. Duration: Patient has had the wound for > 2 years prior to seeking treatment at the wound center Timing: for the last 2 years she has been treated at the [facility] wound care center Context: The wound appeared gradually over time Modifying Factors: Other treatment(s) tried include:in the last month she has been operated on the left foot for an abscess of that foot and was in hospital for 2 weeks. Associated Signs and Symptoms: Periwound excoriated and wound draining heavily HPI Description: 39 year old patient who is known to be a diabetic comes from the office of Dr. Dario Guardian for a ulcer on the left lower extremity and foot. She has had an ulcer on the left foot and has had previous treatment at [facility]. the patient tells [place] that she has been seen as the outpatient center for a long while and has had various tests including MRIs done but she is not a very good historian.  Most recently about a month ago she had a abscess drained from her left foot and was in hospital for about 2 weeks and was then discharged with a PICC line and IV antibioticstics which she knows may be vancomycin and Levaquin. She has never had a wound VAC now has she had hyperbaric  oxygen therapy. Past medical history significant for dyspnea, hypertension, hyperlipidemia, neuropathy, diabetes type 2 uncontrolled. She smokes cigarettes the latest blood reports are from March 2016 and show that her BMP is essentially normal her lipid panel was also normal her hemoglobin A1c was 9.4. 12/07/2014 -- Her tissue culture is back and she has grown significant amounts of Proteus mirabilis, pseudomonas aeruginosa and Staphylococcus aureus. The staph is a MSSA and all the cultures are sensitive to ciprofloxacin. I will start her on Cipro 500 mg by mouth twice a day for 14 days. Xray left foot -- IMPRESSION:No evidence of osteomyelitis. Healed fracture of the distal left third metatarsal. 12/21/2014 -- she is still awaiting clearance from her insurance company for HBOT. She was seen by Dr. Dario Guardian was working on controlling her diabetes and she continues to work on giving up smoking. She has completed her dosage of ciprofloxacin and we will be continuing with local care. 01/03/2015 -- her insurance clearance is still not in and we are awaiting this before starting her on a hyperbaric oxygen therapy. 01/09/2015 - we are still awaiting her insurance clearance and other than that she is doing fine. 01/26/2015 -- she was unable to tolerate the total contact cast and hence had a Darco shoe back on her left foot. Her blood sugars have been doing better and her last hemoglobin A1c report is pending. He continues to smoke 6-8 cigarettes a day. [name], [name] ([id number]) 02/02/2015 -- yesterday her left leg started getting swollen and has red streaks starting at the foot and going up to her knee. She has  had a mild fever and has not gone to see her PCP or [facility] where she normally gets her treatment for this. 02/15/2015 - she has recently been discharged from [facility] after she was admitted for a cellulitis abscess and osteomyelitis of her left foot. We haven't received all her reports yet but as per the patient she was on IV antibiotics for 10 days and then discharged home on Augmentin. Addendum -- was able to look at her records in Epic. Hospital Course: TOMESHA SARGENT is a 39 y.o. female with PMH of HTN, DM, depression, and tobacco abuse who presented with left diabetic foot ulcer and cellulitis. Left leg cellulitis/Left diabetic foot ulcer with osteomyelitis: The patient presented with worsening pain and green pus drainage from her diabetic foot ulcer and surrounding cellulitis. Most recently, she was hospitalized 4/12-4/19 for this foot wound. She was treated with Vancomycin and Zosyn. ID was consulted and recommended that the patient receive Daptomycin, Ceftazidime, and Flagyl x 2 weeks. She had a PICC line placed prior to discharge. After discharge, she was transitioned to oral antibiotics. Wound cultures have grown GAS, GBS, MSSA, and MRSA.Marland Kitchen She notes that she was last treated with Ciprofloxacin for about 2 weeks and completed this course at least 2 weeks ago. She continues to follow with her wound clinic in [place] weekly. - Obtained MRI LLE which showed osteomyelitis of the left fourth metatarsal head. IMPRESSION: Findings compatible with acute osteomyelitis of the left fourth metatarsal head, with question of nondisplaced pathologic fracture. Plantar ulcer underlying the fourth MTP joint and diffuse soft tissue edema are noted. - Consulted Vascular Surgery, appreciate assistance. Performed left foot ulcer/bone debridement 8/15, sent bone for culture which showed no growth to date. - Started IV Vancomycin, Ceftaz, and Flagyl on admission. Transitioned to IV  Unasyn 8/15. Given negative bone cultures, will transition to oral Augmentin for 2 week  course at discharge. - Provided wound care w/ wet to dry packing to left foot wound with Kerlix wrap, change daily. - Per Vascular Surgery, WBAT LLE with offloading shoe. - Provided pain control with Oxycodone PRN, although pain had improved considerably prior to discharge. - Plan for follow up with Wound Clinic in [place] within 1 week after discharge. Arterial Duplex also done -- Final Interpretationo-- Left: Normal examination. No evidence of arterial occlusive disease.o LT Great toe pressure = 82 mmHg. No significant change asoocompared to previous study.o Psychologist, prison and probation services) Signed: 02/15/2015 2:17:15 PM By: [name] MD, FACS Previous Signature: 02/15/2015 1:40:23 PM Version By: [name] MD, FACS Entered By: [name] on 02/15/2015 14:17:15 [name], [name] ([id number]) -------------------------------------------------------------------------------- Physical Exam Details Patient Name: [name], [name] Date of Service: 02/15/2015 1:00 PM Medical Record Number: [id number] Patient Account Number: [id number] Date of Birth/Sex: 12/28/1975 (39 y.o. Female) Treating RN: Huel Coventry Primary Care Physician: [name] Other Clinician: Referring Physician: [name] Treating Physician/Extender: [name] in Treatment: 11 Constitutional . Pulse regular. Respirations normal and unlabored. Afebrile. . Eyes Nonicteric. Reactive to light. Ears, Nose, Mouth, and Throat Lips, teeth, and gums WNL.Marland Kitchen Moist mucosa without lesions . Neck supple and nontender. No palpable supraclavicular or cervical adenopathy. Normal sized without goiter. Respiratory WNL. No retractions.. Cardiovascular Pedal Pulses WNL. No clubbing, cyanosis or edema. Chest Breasts symmetical and no nipple discharge.. Breast tissue WNL, no masses, lumps, or tenderness.. Lymphatic No adneopathy. No  adenopathy. No adenopathy. Musculoskeletal Adexa without tenderness or enlargement.. Digits and nails w/o clubbing, cyanosis, infection, petechiae, ischemia, or inflammatory conditions.. Integumentary (Hair, Skin) No suspicious lesions. No crepitus or fluctuance. No peri-wound warmth or erythema. No masses.Marland Kitchen Psychiatric Judgement and insight Intact.. No evidence of depression, anxiety, or agitation.. Notes There is minimal cellulitis of the dorsum of the left foot in the region of the fourth and fifth toe. The ulcer on the plantar aspect of the foot probes down to bone and there is some evidence of slough which I will sharply debrided. Electronic Signature(s) Signed: 02/15/2015 1:42:03 PM By: [name] MD, FACS Entered By: [name] on 02/15/2015 13:42:02 Bojanowski, [name] ([id number]) -------------------------------------------------------------------------------- Physician Orders Details Patient Name: [name], [name] Date of Service: 02/15/2015 1:00 PM Medical Record Number: [id number] Patient Account Number: [id number] Date of Birth/Sex: 16-Oct-1975 (39 y.o. Female) Treating RN: Huel Coventry Primary Care Physician: [name] Other Clinician: Referring Physician: [name] Treating Physician/Extender: [name] in Treatment: 11 Verbal / Phone Orders: Yes Clinician: Huel Coventry Read Back and Verified: Yes Diagnosis Coding Wound Cleansing Wound #1 Left,Lateral,Plantar Foot o Clean wound with Normal Saline. Anesthetic Wound #1 Left,Lateral,Plantar Foot o Topical Lidocaine 4% cream applied to wound bed prior to debridement Primary Wound Dressing Wound #1 Left,Lateral,Plantar Foot o Iodoform packing Gauze Secondary Dressing Wound #1 Left,Lateral,Plantar Foot o Gauze and Kerlix/Conform o Drawtex Dressing Change Frequency Wound #1 Left,Lateral,Plantar Foot o Change dressing every day. Follow-up Appointments Wound #1  Left,Lateral,Plantar Foot o Return Appointment in 1 week. Off-Loading Wound #1 Left,Lateral,Plantar Foot o Other: - front off-loading shoe Hyperbaric Oxygen Therapy Wound #1 Left,Lateral,Plantar Foot o Indication: - osteomyelitis o If appropriate for treatment, begin HBOT per protocol: o 2.0 ATA for 90 Minutes without Air Breaks [place], [place]. ([id number]) o One treatment per day (delivered Monday through Friday unless otherwise specified in Special Instructions below): o Total # of Treatments: - 40 o Finger stick Blood Glucose Pre- and Post- HBOT Treatment. o Follow Hyperbaric Oxygen Glycemia Protocol HBO  Contraindications Wound #1 Left,Lateral,Plantar Foot - Left Lower Extremity o HBO contraindications of hyperbaric oxygen therapy were reviewed and the patient found to have no untreated pneumothorax or history of spontaneous pneumothorax. o HBO contraindications of hyperbaric oxygen therapy were reviewed and the patient found to have no history of medications such as Bleomycin, Adriamycin, disulfiram, cisplatin and sulfamylon and is not currently receiving any chemotherapy. o HBO contraindications of hyperbaric oxygen therapy were reviewed and the patient found to have no Upper respiratory infection and chronic sinusitis. o HBO contraindications of hyperbaric oxygen therapy were reviewed and the patient found to have no history of retinal surgery proceeding 6 weeks or intraocular gas o HBO contraindications of hyperbaric oxygen therapy were reviewed and the patient found to have no history seizure disorder or any anticonvulsant medication. o HBO contraindications of hyperbaric oxygen therapy were reviewed and the patient found to have no septicemia with CO2 retention. o HBO contraindications of hyperbaric oxygen therapy were reviewed and the patient found to have no fever greater than 100 degrees. o HBO contraindications of hyperbaric oxygen  therapy were reviewed and the patient found to have no pregnancy noted. o HBO contraindications of hyperbaric oxygen therapy were reviewed and the patient found to have no medications such as steroids or narcotics or Phenergan. Consults o Infectious Disease oooo GLYCEMIA INTERVENTIONS PROTOCOL PRE-HBO GLYCEMIA INTERVENTIONS ACTION INTERVENTION Obtain pre-HBO capillary blood 1 glucose (ensure physician order is in chart). A. Notify HBO physician and await physician orders. 2 If result is 70 mg/dl or below: B. If the result meets the hospital definition of a critical result, follow hospital policy. [name], [name] ([id number]) If result is 71 mg/dl to 409 mg/dl: A. Give patient an 8 ounce Glucerna Shake, an 8 ounce Ensure, or 8 ounces of a Glucerna/Ensure equivalent dietary supplement*. B. Wait 30 minutes. C. Retest patientos capillary blood glucose (CBG). D. If result greater than or equal to 110 mg/dl, proceed with HBO. If result less than 110 mg/dl, notify HBO physician and consider holding HBO. If result is 131 mg/dl to 811 mg/dl: A. Proceed with HBO. A. Notify HBO physician and await physician orders. B. It is recommended to hold HBO and do blood/urine ketone If result is 250 mg/dl or greater: testing. C. If the result meets the hospital definition of a critical result, follow hospital policy. POST-HBO GLYCEMIA INTERVENTIONS ACTION INTERVENTION Obtain post HBO capillary blood 1 glucose (ensure physician order is in chart). A. Notify HBO physician and await physician orders. 2 If result is 70 mg/dl or below: B. If the result meets the hospital definition of a critical result, follow hospital policy. A. Give patient an 8 ounce Glucerna Shake, an 8 ounce Ensure, or 8 ounces of a Glucerna/Ensure equivalent dietary supplement*. B. Wait 15 minutes for symptoms of hypoglycemia (i.e. nervousness, anxiety, If result is 71 mg/dl to 914  mg/dl: sweating, chills, clamminess, irritability, confusion, tachycardia or dizziness). C. If patient asymptomatic, discharge patient. If patient symptomatic, repeat capillary blood glucose (CBG) and notify HBO physician. If result is 101 mg/dl to 782 mg/dl: A. Discharge patient. If result is 250 mg/dl or greater: [name], [name] ([id number]) A. Notify HBO physician and await physician orders. B. It is recommended to do blood/urine ketone testing. C. If the result meets the hospital definition of a critical result, follow hospital policy. *Juice or candies are NOT equivalent products. If patient refuses the Glucerna or Ensure, please consult the hospital dietitian for an appropriate substitute. Electronic Signature(s) Signed: 02/15/2015 3:28:23 PM  By: Elliot Gurney, RN, BSN, Kim RN, BSN Signed: 02/15/2015 4:07:43 PM By: [name] MD, FACS Entered By: Elliot Gurney RN, BSN, Kim on 02/15/2015 14:09:37 [name], [name] ([id number]) -------------------------------------------------------------------------------- Problem List Details Patient Name: Bhattacharyya, Barbra M. Date of Service: 02/15/2015 1:00 PM Medical Record Number: [id number] Patient Account Number: [id number] Date of Birth/Sex: 11-May-1976 (39 y.o. 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 F17.218 Nicotine dependence, cigarettes, with other nicotine- 11/30/2014 Yes induced disorders E66.01 Morbid (severe) obesity due to excess calories 11/30/2014  Yes L03.116 Cellulitis of left lower limb 02/02/2015 Yes 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 Date of Service: 02/15/2015 1:00 PM Medical Record Number: [id number] Patient Account Number: [id number] Date of Birth/Sex: [birth year] (39 y.o. Female) Treating RN: Huel Coventry Primary Care Physician: [name] Other Clinician: Referring Physician: [name] Treating Physician/Extender: [name] in Treatment: 11 Subjective Chief Complaint Information obtained from Patient Patients presents for treatment of an open diabetic ulcer. The patient has had an ulcerated wound on her left lower extremity for about 2 years. History of Present Illness (HPI) The following HPI elements were documented for the patient's wound: Location: left foot ulcer Quality: Patient reports No Pain. Severity: Wound is moderately severed with some exposed structure in the wound bed. Duration: Patient has had the wound for > 2 years prior to seeking treatment at the wound center Timing: for the last 2 years she has been treated at the [facility] wound care center Context: The wound appeared gradually over time Modifying Factors: Other treatment(s) tried include:in the last month she has been operated on the left foot for an abscess of that foot and was in hospital for 2 weeks. Associated Signs and Symptoms: Periwound excoriated and wound draining heavily 39 year old patient who is known to be a diabetic comes from the office of Dr. Dario Guardian for a ulcer on the left lower extremity and foot. She has had an ulcer on the left foot and has had  previous treatment at [facility]. the patient tells [place] that she has been seen as the outpatient center for a long while and has had various tests including MRIs done but she is not a very good historian. Most recently about a month ago she had a abscess drained from her left foot and was in hospital for about 2 weeks and was then discharged with a PICC line and IV antibioticstics which she knows may be vancomycin and Levaquin. She has never had a wound VAC now has she had hyperbaric oxygen therapy. Past medical history significant for dyspnea, hypertension, hyperlipidemia, neuropathy, diabetes type 2 uncontrolled. She smokes cigarettes the latest blood reports are from March 2016 and show that her BMP is essentially normal her lipid panel was also normal her hemoglobin A1c was 9.4. 12/07/2014 -- Her tissue culture is back and she has grown significant amounts of Proteus mirabilis, pseudomonas aeruginosa and Staphylococcus aureus. The staph is a MSSA  and all the cultures are sensitive to ciprofloxacin. I will start her on Cipro 500 mg by mouth twice a day for 14 days. Xray left foot -- IMPRESSION:No evidence of osteomyelitis. Healed fracture of the distal left third metatarsal. [place]sbrook, [name] ([id number]) 12/21/2014 -- she is still awaiting clearance from her insurance company for HBOT. She was seen by Dr. Dario Guardian was working on controlling her diabetes and she continues to work on giving up smoking. She has completed her dosage of ciprofloxacin and we will be continuing with local care. 01/03/2015 -- her insurance clearance is still not in and we are awaiting this before starting her on a hyperbaric oxygen therapy. 01/09/2015 - we are still awaiting her insurance clearance and other than that she is doing fine. 01/26/2015 -- she was unable to tolerate the total contact cast and hence had a Darco shoe back on her left foot. Her blood sugars have been doing better and her last hemoglobin A1c  report is pending. He continues to smoke 6-8 cigarettes a day. 02/02/2015 -- yesterday her left leg started getting swollen and has red streaks starting at the foot and going up to her knee. She has had a mild fever and has not gone to see her PCP or [facility] where she normally gets her treatment for this. 02/15/2015 - she has recently been discharged from [facility] after she was admitted for a cellulitis abscess and osteomyelitis of her left foot. We haven't received all her reports yet but as per the patient she was on IV antibiotics for 10 days and then discharged home on Augmentin. Addendum -- was able to look at her records in Epic. Hospital Course: LOURDEZ MCGAHAN is a 39 y.o. female with PMH of HTN, DM, depression, and tobacco abuse who presented with left diabetic foot ulcer and cellulitis. Left leg cellulitis/Left diabetic foot ulcer with osteomyelitis: The patient presented with worsening pain and green pus drainage from her diabetic foot ulcer and surrounding cellulitis. Most recently, she was hospitalized 4/12-4/19 for this foot wound. She was treated with Vancomycin and Zosyn. ID was consulted and recommended that the patient receive Daptomycin, Ceftazidime, and Flagyl x 2 weeks. She had a PICC line placed prior to discharge. After discharge, she was transitioned to oral antibiotics. Wound cultures have grown GAS, GBS, MSSA, and MRSA.Marland Kitchen She notes that she was last treated with Ciprofloxacin for about 2 weeks and completed this course at least 2 weeks ago. She continues to follow with her wound clinic in [place] weekly. - Obtained MRI LLE which showed osteomyelitis of the left fourth metatarsal head. IMPRESSION: Findings compatible with acute osteomyelitis of the left fourth metatarsal head, with question of nondisplaced pathologic fracture. Plantar ulcer underlying the fourth MTP joint and diffuse soft tissue edema are noted. - Consulted Vascular Surgery,  appreciate assistance. Performed left foot ulcer/bone debridement 8/15, sent bone for culture which showed no growth to date. - Started IV Vancomycin, Ceftaz, and Flagyl on admission. Transitioned to IV Unasyn 8/15. Given negative bone cultures, will transition to oral Augmentin for 2 week course at discharge. - Provided wound care w/ wet to dry packing to left foot wound with Kerlix wrap, change daily. - Per Vascular Surgery, WBAT LLE with offloading shoe. - Provided pain control with Oxycodone PRN, although pain had improved considerably prior to discharge. - Plan for follow up with Wound Clinic in [place] within 1 week after discharge. Arterial Duplex also done -- Final Interpretation -- Left: Normal examination.  No evidence of arterial occlusive disease. LT Great toe pressure = 82 mmHg. No significant change as compared to previous study. Bastos, [name] ([id number]) Objective Constitutional Pulse regular. Respirations normal and unlabored. Afebrile. Vitals Time Taken: 1:10 PM, Height: 67 in, Weight: 290 lbs, BMI: 45.4, Temperature: 98.4 F, Pulse: 108 bpm, Respiratory Rate: 18 breaths/min, Blood Pressure: 137/90 mmHg. Eyes Nonicteric. Reactive to light. Ears, Nose, Mouth, and Throat Lips, teeth, and gums WNL.Marland Kitchen Moist mucosa without lesions . Neck supple and nontender. No palpable supraclavicular or cervical adenopathy. Normal sized without goiter. Respiratory WNL. No retractions.. Cardiovascular Pedal Pulses WNL. No clubbing, cyanosis or edema. Chest Breasts symmetical and no nipple discharge.. Breast tissue WNL, no masses, lumps, or tenderness.. Lymphatic No adneopathy. No adenopathy. No adenopathy. Musculoskeletal Adexa without tenderness or enlargement.. Digits and nails w/o clubbing, cyanosis, infection, petechiae, ischemia, or inflammatory conditions.Marland Kitchen Psychiatric Judgement and insight Intact.. No evidence of depression, anxiety, or agitation.. General Notes:  There is minimal cellulitis of the dorsum of the left foot in the region of the fourth and fifth toe. The ulcer on the plantar aspect of the foot probes down to bone and there is some evidence of slough which I will sharply debrided. Integumentary (Hair, Skin) No suspicious lesions. No crepitus or fluctuance. No peri-wound warmth or erythema. No masses.. Wound #1 status is Open. Original cause of wound was Gradually Appeared. The wound is located on the Left,Lateral,Plantar Foot. The wound measures 1.2cm length x 0.5cm width x 1.5cm depth; 0.471cm^2 area Sirico, Stephaniemarie M. ([id number]) and 0.707cm^3 volume. The wound is limited to skin breakdown. There is no tunneling or undermining noted. There is a large amount of serosanguineous drainage noted. The wound margin is thickened. There is medium (34-66%) pink granulation within the wound bed. There is a small (1-33%) amount of necrotic tissue within the wound bed including Adherent Slough. The periwound skin appearance exhibited: Localized Edema, Moist, Erythema. The periwound skin appearance did not exhibit: Callus, Crepitus, Excoriation, Fluctuance, Friable, Induration, Rash, Scarring, Dry/Scaly, Maceration, Atrophie Blanche, Cyanosis, Ecchymosis, Hemosiderin Staining, Mottled, Pallor, Rubor. The surrounding wound skin color is noted with erythema with red streaks. Periwound temperature was noted as No Abnormality. 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 She has been authorized for hyperbaric oxygen therapy and will greatly benefit from starting this as soon as possible. Debridement of the bone has been done recently. I will also ask her to see Dr. Sampson Goon for the possibility of IV antibiotics and will await this appointment;until then she will continue with Augmentin. Risk, benefits, alternatives and all the possible complications of using hyperbaric oxygen therapy have been discussed with him in great detail and she is agreeable to proceed. Procedures Wound #1 Wound #1 is a Diabetic Wound/Ulcer of the Lower Extremity located on the Left,Lateral,Plantar Foot . There was a Skin/Subcutaneous Tissue Debridement ([id number]) debridement with total area of 0.6 sq cm performed by Rollyn Scialdone, Ignacia Felling., MD. with the following instrument(s): Forceps and  Scissors to remove Viable and Non-Viable tissue/material including Exudate, Fibrin/Slough, Skin, and Subcutaneous after achieving pain control using Other (lidocaine 4%). A time out was conducted prior to the start of the procedure. A Minimum amount of bleeding was controlled with Pressure. The procedure was not tolerated well with a Web, Hyla M. ([id number]) pain level of 0 throughout and a pain level of 0 following the procedure. Post Debridement Measurements: 1.2cm length x 0.8cm width x 1.5cm depth; 1.131cm^3 volume. Post procedure Diagnosis Wound #1: Same as Pre-Procedure Plan Wound Cleansing: Wound #1 Left,Lateral,Plantar Foot: Clean wound with Normal Saline. Anesthetic: Wound #1 Left,Lateral,Plantar Foot: Topical Lidocaine 4% cream applied to wound bed prior to debridement Primary Wound Dressing: Wound #1 Left,Lateral,Plantar Foot: Iodoform packing Gauze Secondary Dressing: Wound #1 Left,Lateral,Plantar Foot: Gauze and Kerlix/Conform Drawtex Dressing Change  Frequency: Wound #1 Left,Lateral,Plantar Foot: Change dressing every day. Follow-up Appointments: Wound #1 Left,Lateral,Plantar Foot: Return Appointment in 1 week. Off-Loading: Wound #1 Left,Lateral,Plantar Foot: Other: - front off-loading shoe Hyperbaric Oxygen Therapy: Wound #1 Left,Lateral,Plantar Foot: Indication: - osteomyelitis If appropriate for treatment, begin HBOT per protocol: 2.0 ATA for 90 Minutes without Air Breaks One treatment per day (delivered Monday through Friday unless otherwise specified in Special Instructions below): Total # of Treatments: - 40 Finger stick Blood Glucose Pre- and Post- HBOT Treatment. Follow Hyperbaric Oxygen Glycemia Protocol HBO Contraindications: Wound #1 Left,Lateral,Plantar Foot: HBO contraindications of hyperbaric oxygen therapy were reviewed and the patient found to have no untreated pneumothorax or history of spontaneous pneumothorax. HBO contraindications of hyperbaric oxygen therapy were reviewed and the patient found to have no history of medications such as Bleomycin, Adriamycin, disulfiram, cisplatin and sulfamylon and is not currently receiving any chemotherapy. Balke, [name] HBO contraindications of hyperbaric oxygen therapy were reviewed and the patient found to have no Upper respiratory infection and chronic sinusitis. HBO contraindications of hyperbaric oxygen therapy were reviewed and the patient found to have no history of retinal surgery proceeding 6 weeks or intraocular gas HBO contraindications of hyperbaric oxygen therapy were reviewed and the patient found to have no history seizure disorder or any anticonvulsant medication. HBO contraindications of hyperbaric oxygen therapy were reviewed and the patient found to have no septicemia with CO2 retention. HBO contraindications of hyperbaric oxygen therapy were reviewed and the patient found to have no fever greater than 100 degrees. HBO  contraindications of hyperbaric oxygen therapy were reviewed and the patient found to have no pregnancy noted. HBO contraindications of hyperbaric oxygen therapy were reviewed and the patient found to have no medications such as steroids or narcotics or Phenergan. Consults ordered were: Infectious Disease This patient with a DFU of the left foot which is a Wagner 3 now has osteomyelitis to be confirmed by biopsy and reports are awaited. She has been authorized for hyperbaric oxygen therapy and will greatly benefit from starting this as soon as possible. Debridement of the bone has been done recently. I will also ask her to see Dr. Sampson Goon for the possibility of IV antibiotics and will await this appointment;until then she will continue with Augmentin. Risk, benefits, alternatives and all the possible complications of using hyperbaric oxygen therapy have been discussed with him in great detail and she is agreeable to proceed. Electronic Signature(s) Signed: 02/15/2015 2:24:31 PM By: [name] MD, FACS Previous Signature: 02/15/2015 2:17:31 PM Version By: [name] MD, FACS Previous Signature: 02/15/2015 1:44:28 PM Version By: [name] MD, FACS Entered By: [name] on 02/15/2015 14:24:31 Schwier, [name] --------------------------------------------------------------------------------  SuperBill Details Patient Name: DEONI, COSEY. Date of Service: 02/15/2015 Medical Record Number: [id number] Patient Account Number: [id number] Date of Birth/Sex: 12/05/1975 (39 y.o. Female) Treating RN: Huel Coventry Primary Care Physician: [name] Other Clinician: Referring Physician: [name] Treating Physician/Extender: [name] in Treatment: 11 Diagnosis Coding ICD-10 Codes Code Description E11.621 Type 2 diabetes mellitus with foot ulcer E08.40 Diabetes mellitus due to underlying condition with diabetic neuropathy, unspecified L97.513  Non-pressure chronic ulcer of other part of right foot with necrosis of muscle F17.218 Nicotine dependence, cigarettes, with other nicotine-induced disorders E66.01 Morbid (severe) obesity due to excess calories L03.116 Cellulitis of left lower limb 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, left ankle and foot Facility Procedures CPT4 Code: [id number] Description: 11042 - DEB SUBQ TISSUE 20 SQ CM/< ICD-10 Description Diagnosis E11.621 Type 2 diabetes mellitus with foot ulcer L03.116 Cellulitis of left lower limb 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, left ankle and Modifier: foot Quantity: 1 Physician Procedures CPT4 Code: [id number] Description: 99213 - WC PHYS LEVEL 3 - EST PT ICD-10 Description Diagnosis E11.621 Type 2 diabetes mellitus with foot ulcer 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, left ankle and L03.116 Cellulitis of left lower limb Modifier: 25 foot Quantity: 1 CPT4 Code: [id number] Rigor, LI Description: 11042 - WC PHYS SUBQ TISS 20 SQ CM ICD-10 Description Diagnosis E11.621 Type 2 diabetes mellitus with foot ulcer L03.116 Cellulitis of left lower limb 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, left ankle and SA M. ([id number]) Modifier: foot Quantity: 1 Electronic Signature(s) Signed: 02/15/2015 1:44:59 PM By: [name] MD, FACS Entered By: [name] on 02/15/2015 13:44:59

## 2015-02-15 NOTE — Progress Notes (Signed)
ROUX, BRANDY ([id number]) Visit Report for 02/15/2015 Arrival Information Details Patient Name: [name], [name] Date of Service: 02/15/2015 1:00 PM Medical Record Number: [id number] Patient Account Number: [id number] Date of Birth/Sex: 02/17/1976 (39 y.o. Female) Treating RN: Huel Coventry Primary Care Physician: [name] Other Clinician: Referring Physician: [name] Treating Physician/Extender: [name] in Treatment: 11 Visit Information History Since Last Visit Added or deleted any medications: Yes Patient Arrived: Ambulatory Any new allergies or adverse reactions: No Arrival Time: 13:07 Had a fall or experienced change in No Accompanied By: self activities of daily living that may affect Transfer Assistance: None risk of falls: Patient Identification Verified: Yes Signs or symptoms of abuse/neglect since last No Secondary Verification Process Yes visito Completed: Hospitalized since last visit: Yes Patient Has Alerts: Yes Has Dressing in Place as Prescribed: Yes Patient Alerts: Type II Diabetic Pain Present Now: No 11/30/2014 ABI: (L) 1.24 (R) 0.92 Electronic Signature(s) Signed: 02/15/2015 3:28:23 PM By: Elliot Gurney, RN, BSN, Kim RN, BSN Entered By: Elliot Gurney, RN, BSN, Kim on 02/15/2015 13:08:28 [name], [name] -------------------------------------------------------------------------------- Encounter Discharge Information Details Patient Name: [name], [name] Date of Service: 02/15/2015 1:00 PM Medical Record Number: [id number] Patient Account Number: [id number] Date of Birth/Sex: [birth year] (39 y.o. Female) Treating RN: Huel Coventry Primary Care Physician: [name] Other Clinician: Referring Physician: [name] Treating Physician/Extender: [name] in Treatment: 11 Encounter Discharge Information Items Discharge Pain Level: 0 Discharge Condition: Stable Ambulatory Status: Ambulatory Discharge  Destination: Home Transportation: Private Auto Accompanied By: self Schedule Follow-up Appointment: Yes Medication Reconciliation completed and provided to Patient/Care Yes Zakkary Thibault: Provided on Clinical Summary of Care: 02/15/2015 Form Type Recipient Paper Patient LS Electronic Signature(s) Signed: 02/15/2015 2:13:40 PM By: Gwenlyn Perking Entered By: Gwenlyn Perking on 02/15/2015 14:13:40 [name], [name] -------------------------------------------------------------------------------- Lower Extremity Assessment Details Patient Name: [name], [name] Date of Service: 02/15/2015 1:00 PM Medical Record Number: [id number] Patient Account Number: [id number] Date of Birth/Sex: May 28, 1976 (39 y.o. Female) Treating RN: Huel Coventry Primary Care Physician: [name] Other Clinician: Referring Physician: [name] Treating Physician/Extender: [name] in Treatment: 11 Vascular Assessment Pulses: Posterior Tibial Dorsalis Pedis Palpable: [Left:Yes] Extremity colors, hair growth, and conditions: Extremity Color: [Left:Normal] Hair Growth on Extremity: [Left:Yes] Temperature of Extremity: [Left:Warm] Toe Nail Assessment Left: Right: Thick: Yes Discolored: Yes Deformed: Yes Improper Length and Hygiene: Yes Electronic Signature(s) Signed: 02/15/2015 3:28:23 PM By: Elliot Gurney, RN, BSN, Kim RN, BSN Entered By: Elliot Gurney, RN, BSN, Kim on 02/15/2015 13:17:06 [name], [name] -------------------------------------------------------------------------------- Multi Wound Chart Details Patient Name: [name], [name] Date of Service: 02/15/2015 1:00 PM Medical Record Number: [id number] Patient Account Number: [id number] Date of Birth/Sex: 14-Jan-1976 (40 y.o. Female) Treating RN: Huel Coventry Primary Care Physician: [name] Other Clinician: Referring Physician: [name] Treating Physician/Extender: [name] in Treatment:  11 Vital Signs Height(in): 67 Pulse(bpm): 108 Weight(lbs): 290 Blood Pressure 137/90 (mmHg): Body Mass Index(BMI): 45 Temperature(F): 98.4 Respiratory Rate 18 (breaths/min): Photos: [1:No Photos] [N/A:N/A] Wound Location: [1:Left Foot - Plantar, Lateral N/A] Wounding Event: [1:Gradually Appeared] [N/A:N/A] Primary Etiology: [1:Diabetic Wound/Ulcer of N/A the Lower Extremity] Comorbid History: [1:Type II Diabetes] [N/A:N/A] Date Acquired: [1:11/21/2013] [N/A:N/A] Weeks of Treatment: [1:11] [N/A:N/A] Wound Status: [1:Open] [N/A:N/A] Measurements L x W x D 1.2x0.5x1.5 [N/A:N/A] (cm) Area (cm) : [1:0.471] [N/A:N/A] Volume (cm) : [1:0.707] [N/A:N/A] % Reduction in Area: [1:79.00%] [N/A:N/A] % Reduction in Volume: 65.00% [N/A:N/A] Classification: [1:Grade 3] [N/A:N/A] Wagner Verification: [1:Abscess] [N/A:N/A] Exudate Amount: [1:Large] [N/A:N/A] Exudate Type: [1:Serosanguineous] [N/A:N/A]  Exudate Color: [1:red, brown] [N/A:N/A] Foul Odor After [1:Yes] [N/A:N/A] Cleansing: Odor Anticipated Due to No [N/A:N/A] Product Use: Wound Margin: [1:Thickened] [N/A:N/A] Granulation Amount: [1:Medium (34-66%)] [N/A:N/A] Granulation Quality: [1:Pink] [N/A:N/A] Necrotic Amount: [1:Small (1-33%)] [N/A:N/A] Exposed Structures: [N/A:N/A] Fascia: No Fat: No Tendon: No Muscle: No Joint: No Bone: No Limited to Skin Breakdown Epithelialization: Small (1-33%) N/A N/A Periwound Skin Texture: Edema: Yes N/A N/A Excoriation: No Induration: No Callus: No Crepitus: No Fluctuance: No Friable: No Rash: No Scarring: No Periwound Skin Moist: Yes N/A N/A Moisture: Maceration: No Dry/Scaly: No Periwound Skin Color: Erythema: Yes N/A N/A Atrophie Blanche: No Cyanosis: No Ecchymosis: No Hemosiderin Staining: No Mottled: No Pallor: No Rubor: No Erythema Location: Red Streaks N/A N/A Temperature: No Abnormality N/A N/A Tenderness on No N/A N/A Palpation: Wound Preparation: Ulcer  Cleansing: N/A N/A Rinsed/Irrigated with Saline Topical Anesthetic Applied: Other: lidocaine 4% Treatment Notes Electronic Signature(s) Signed: 02/15/2015 3:28:23 PM By: Elliot Gurney, RN, BSN, Kim RN, BSN Entered By: Elliot Gurney, RN, BSN, Kim on 02/15/2015 13:18:40 Lamping, [name] ([id number]) -------------------------------------------------------------------------------- Multi-Disciplinary Care Plan Details Patient Name: Brayfield, [name] Date of Service: 02/15/2015 1:00 PM Medical Record Number: [id number] Patient Account Number: [id number] Date of Birth/Sex: [birth year] (39 y.o. 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 oxygen chamber Anxiety related to knowledge deficit of hyperbaric oxygen therapy and treatment procedures Discomfort related to temperature and humidity changes inside hyperbaric chamber Potential for barotraumas to ears, sinuses, teeth, and lungs or cerebral gas embolism related to changes in atmospheric pressure inside hyperbaric oxygen chamber Potential for oxygen toxicity seizures related to delivery of 100% oxygen at an increased atmospheric pressure Potential for pulmonary oxygen toxicity related to delivery of 100% oxygen at an increased atmospheric pressure Goals: Barotrauma will be prevented during HBO2 Date Initiated: 02/15/2015 Goal Status: Active Patient and/or family will be able to state/discuss factors appropriate to the management of their disease process during treatment Date Initiated: 02/15/2015 Goal Status: Active Patient will tolerate the hyperbaric oxygen therapy treatment Date Initiated: 02/15/2015 Goal Status: Active Patient will tolerate the internal climate of the chamber Date  Initiated: 02/15/2015 Goal Status: Active Patient/caregiver will verbalize understanding of HBO goals, rationale, procedures and potential hazards Date Initiated: 02/15/2015 Goal Status: Active Signs and symptoms of pulmonary oxygen toxicity will be recognized and promptly addressed Date Initiated: 02/15/2015 Goal Status: Active Signs and symptoms of seizure will be recognized and promptly addressed ; seizing patients will suffer no harm Date Initiated: 02/15/2015 Gentles, [name] ([id number]) Goal Status: Active Interventions: Administer a five (5) minute air break for patient if signs and symptoms of seizure appear and notify the hyperbaric physician Administer the correct therapeutic gas delivery based on the patients needs and limitations, per physician order Assess and provide for patientos comfort related to the hyperbaric environment and equalization of middle ear Assess for signs and symptoms related to adverse events, including but not limited to confinement anxiety, pneumothorax, oxygen toxicity and baurotrauma Assess patient for any history of confinement anxiety Assess patient's knowledge and expectations regarding hyperbaric medicine and provide education related to the hyperbaric environment, goals of treatment and prevention of adverse events Notes: Nutrition Nursing Diagnoses: Potential for alteratiion in Nutrition/Potential for imbalanced nutrition Goals: Patient/caregiver agrees to and verbalizes understanding of need to use nutritional supplements and/or vitamins as prescribed Date Initiated: 11/30/2014 Goal  Status: Active Interventions: Assess patient nutrition upon admission and as needed per policy Notes: Orientation to the Wound Care Program Nursing Diagnoses: Knowledge deficit related to the wound healing center program Goals: Patient/caregiver will verbalize understanding of the Wound Healing Center Program Date Initiated: 11/30/2014 Goal Status:  Active Interventions: Provide education on orientation to the wound center Notes: Schnider, [name] ([id number]) Peripheral Neuropathy Nursing Diagnoses: Potential alteration in peripheral tissue perfusion (select prior to confirmation of diagnosis) Goals: Patient/caregiver will verbalize understanding of disease process and disease management Date Initiated: 11/30/2014 Goal Status: Active Interventions: Assess signs and symptoms of neuropathy upon admission and as needed Notes: Wound/Skin Impairment Nursing Diagnoses: Impaired tissue integrity Knowledge deficit related to smoking impact on wound healing Goals: Ulcer/skin breakdown will have a volume reduction of 30% by week 4 Date Initiated: 11/30/2014 Goal Status: Active Interventions: Assess ulceration(s) every visit Notes: Electronic Signature(s) Signed: 02/15/2015 3:28:23 PM By: Elliot Gurney, RN, BSN, Kim RN, BSN Entered By: Elliot Gurney, RN, BSN, Kim on 02/15/2015 13:43:43 Urista, [name] ([id number]) -------------------------------------------------------------------------------- Pain Assessment Details Patient Name: Postema, [name] Date of Service: 02/15/2015 1:00 PM Medical Record Number: [id number] Patient Account Number: [id number] Date of Birth/Sex: 11/30/1975 (39 y.o. Female) Treating RN: Huel Coventry Primary Care Physician: [name] Other Clinician: Referring Physician: [name] Treating Physician/Extender: [name] in Treatment: 11 Active Problems Location of Pain Severity and Description of Pain Patient Has Paino No Site Locations Pain Management and Medication Current Pain Management: Electronic Signature(s) Signed: 02/15/2015 3:28:23 PM By: Elliot Gurney, RN, BSN, Kim RN, BSN Entered By: Elliot Gurney, RN, BSN, Kim on 02/15/2015 13:08:34 Lefferts, [name] ([id number]) -------------------------------------------------------------------------------- Patient/Caregiver Education Details Patient Name:  [name] Date of Service: 02/15/2015 1:00 PM Medical Record Number: [id number] Patient Account Number: [id number] Date of Birth/Gender: 22-Jun-1976 (39 y.o. Female) Treating RN: Huel Coventry Primary Care Physician: [name] Other Clinician: Referring Physician: [name] Treating Physician/Extender: [name] in Treatment: 11 Education Assessment Education Provided To: Patient Education Topics Provided Hyperbaric Oxygenation: Handouts: Hyperbaric Oxygen Methods: Demonstration, Explain/Verbal, Printed Responses: State content correctly Wound/Skin Impairment: Handouts: Caring for Your Ulcer, Other: continue wound care as prescribed Methods: Demonstration, Explain/Verbal Responses: State content correctly Electronic Signature(s) Signed: 02/15/2015 3:28:23 PM By: Elliot Gurney, RN, BSN, Kim RN, BSN Entered By: Elliot Gurney, RN, BSN, Kim on 02/15/2015 13:42:42 Scully, [name] ([id number]) -------------------------------------------------------------------------------- Wound Assessment Details Patient Name: Knudtson, [name] Date of Service: 02/15/2015 1:00 PM Medical Record Number: [id number] Patient Account Number: [id number] Date of Birth/Sex: Mar 06, 1976 (39 y.o. Female) Treating RN: Huel Coventry Primary Care Physician: [name] Other Clinician: Referring Physician: [name] Treating Physician/Extender: [name] in Treatment: 11 Wound Status Wound Number: 1 Primary Diabetic Wound/Ulcer of the Lower Etiology: Extremity Wound Location: Left Foot - Plantar, Lateral Wound Status: Open Wounding Event: Gradually Appeared Comorbid Type II Diabetes Date Acquired: 11/21/2013 History: Weeks Of Treatment: 11 Clustered Wound: No Wound Measurements Length: (cm) 1.2 Width: (cm) 0.5 Depth: (cm) 1.5 Area: (cm) 0.471 Volume: (cm) 0.707 % Reduction in Area: 79% % Reduction in Volume: 65% Epithelialization: Small (1-33%) Tunneling:  No Undermining: No Wound Description Classification: Grade 3 Wagner Verification: Abscess Wound Margin: Thickened Exudate Amount: Large Exudate Type: Serosanguineous Exudate Color: red, brown Foul Odor After Cleansing: Yes Due to Product Use: No Wound Bed Granulation Amount: Medium (34-66%) Exposed Structure Granulation Quality: Pink Fascia Exposed: No Necrotic Amount: Small (1-33%) Fat Layer Exposed: No Necrotic Quality: Adherent Slough Tendon Exposed: No Muscle Exposed: No Joint Exposed: No Bone Exposed: No Limited  to Skin Breakdown Periwound Skin Texture Texture Color No Abnormalities Noted: No No Abnormalities Noted: No Callus: No Atrophie Blanche: No Crepitus: No Cyanosis: No Excoriation: No Ecchymosis: No Saari, [name] ([id number]) Fluctuance: No Erythema: Yes Friable: No Erythema Location: Red Streaks Induration: No Hemosiderin Staining: No Localized Edema: Yes Mottled: No Rash: No Pallor: No Scarring: No Rubor: No Moisture Temperature / Pain No Abnormalities Noted: No Temperature: No Abnormality Dry / Scaly: No Maceration: No Moist: Yes Wound Preparation Ulcer Cleansing: Rinsed/Irrigated with Saline Topical Anesthetic Applied: Other: lidocaine 4%, Treatment Notes Wound #1 (Left, Lateral, Plantar Foot) 1. Cleansed with: Clean wound with Normal Saline 2. Anesthetic Topical Lidocaine 4% cream to wound bed prior to debridement 4. Dressing Applied: Iodoform packing Gauze 5. Secondary Dressing Applied ABD and Kerlix/Conform 7. Secured with Tape Notes [employer], dispensing) Signed: 02/15/2015 3:28:23 PM By: Elliot Gurney, RN, BSN, Kim RN, BSN Entered By: Elliot Gurney, RN, BSN, Kim on 02/15/2015 13:15:27 Bulluck, [name] -------------------------------------------------------------------------------- Vitals Details Patient Name: Vanhoose, [name] Date of Service: 02/15/2015 1:00 PM Medical Record Number:  [id number] Patient Account Number: [id number] Date of Birth/Sex: [birth year] (39 y.o. Female) Treating RN: Huel Coventry Primary Care Physician: [name] Other Clinician: Referring Physician: [name] Treating Physician/Extender: [name] in Treatment: 11 Vital Signs Time Taken: 13:10 Temperature (F): 98.4 Height (in): 67 Pulse (bpm): 108 Weight (lbs): 290 Respiratory Rate (breaths/min): 18 Body Mass Index (BMI): 45.4 Blood Pressure (mmHg): 137/90 Reference Range: 80 - 120 mg / dl Electronic Signature(s) Signed: 02/15/2015 3:28:23 PM By: Elliot Gurney, RN, BSN, Kim RN, BSN Entered By: Elliot Gurney, RN, BSN, Kim on 02/15/2015 13:11:42

## 2015-02-19 ENCOUNTER — Encounter (HOSPITAL_BASED_OUTPATIENT_CLINIC_OR_DEPARTMENT_OTHER): Payer: Medicaid Other | Admitting: General Surgery

## 2015-02-19 DIAGNOSIS — E10621 Type 1 diabetes mellitus with foot ulcer: Secondary | ICD-10-CM

## 2015-02-19 DIAGNOSIS — L97509 Non-pressure chronic ulcer of other part of unspecified foot with unspecified severity: Secondary | ICD-10-CM | POA: Diagnosis not present

## 2015-02-19 DIAGNOSIS — L97513 Non-pressure chronic ulcer of other part of right foot with necrosis of muscle: Secondary | ICD-10-CM | POA: Diagnosis not present

## 2015-02-19 LAB — GLUCOSE, CAPILLARY
Glucose-Capillary: 307 mg/dL — ABNORMAL HIGH (ref 65–99)
Glucose-Capillary: 277 mg/dL — ABNORMAL HIGH (ref 65–99)

## 2015-02-19 NOTE — Progress Notes (Signed)
SAMIA, KUKLA ([id number]) Visit Report for 02/19/2015 HBO Details Patient Name: [name], [name] 02/19/2015 10:00 Date of Service: AM Medical Record [id number] Number: Patient Account Number: [id number] [birth year] (39 y.o. Treating RN: Date of Birth/Sex: Female) Other Clinician: Primary Care Physician: [name], [name] Referring Physician: [name] Physician/Extender: Weeks in Treatment: 11 HBO Treatment Course Details Treatment Course Ordering Physician: [name] 1 Number: HBO Treatment Start Date: 02/19/2015 Total Treatments 30 Ordered: HBO Indication: Diabetic Ulcer(s) of the Lower Extremity HBO Treatment Details Treatment Number: 1 Patient Type: Outpatient Chamber Type: Monoplace Chamber #: [id number] Treatment Protocol: 2.0 ATA with 90 minutes oxygen, and no air breaks Treatment Details Compression Rate Down: 1.5 psi / minute De-Compression Rate Up: 1.5 psi / minute Air breaks and breathing Compress Tx Pressure Decompress Decompress periods Begins Reached Begins Ends (leave unused spaces blank) Chamber Pressure 1 ATA 2.0 ATA - - - - - - 2.0 ATA 1 ATA Clock Time (24 hr) 09:53 10:04 - - - - - - 11:35 11:48 Treatment Length: 115 (minutes) Treatment Segments: 4 Capillary Blood Glucose Pre Capillary Blood Glucose (mg/dl): Post Capillary Blood Glucose (mg/dl): Vital Signs Capillary Blood Glucose Reference Range: 80 - 120 mg / dl HBO Diabetic Blood Glucose Intervention Range: <131 mg/dl or >865 mg/dl Time Vitals Blood Respiratory Capillary Blood Glucose Pulse Action Type: Pulse: Temperature: Taken: Pressure: Rate: Glucose (mg/dl): Meter #: Oximetry (%) Taken: Pre 09:33 150/90 90 18 97.6 307 1 none Post 11:55 124/80 84 18 97.5 277 1 none Treatment Response Womac, Sharica M. ([id number]) Treatment Completion Status: Treatment Completed without Adverse Event HBO Attestation I certify that I supervised this HBO  treatment in accordance with Medicare guidelines. A trained Yes emergency response team is readily available per hospital policies and procedures. Continue HBOT as ordered. Yes Electronic Signature(s) Signed: 02/19/2015 2:43:49 PM By: [name] MD Entered By: [name] on 02/19/2015 14:43:49 Wittke, [name] ([id number]) -------------------------------------------------------------------------------- HBO Safety Checklist Details Patient Name: [name], [name] 02/19/2015 10:00 Date of Service: AM Medical Record [id number] Number: Patient Account Number: [id number] 1976/06/15 (39 y.o. Treating RN: Date of Birth/Sex: Female) Other Clinician: Primary Care Physician: [name], [name] Referring Physician: [name] Physician/Extender: Weeks in Treatment: 11 HBO Safety Checklist Items Safety Checklist Consent Form Signed Patient voided / foley secured and emptied When did you last eato 19:00 pm on 02/18/15 Last dose of injectable or oral agent 19:00 pm on 02/18/15 NA Ostomy pouch emptied and vented if applicable NA All implantable devices assessed, documented and approved NA Intravenous access site secured and place Valuables secured Linens and cotton and cotton/polyester blend (less than 51% polyester) Personal oil-based products / skin lotions / body lotions removed NA Wigs or hairpieces removed Smoking or tobacco materials removed Books / newspapers / magazines / loose paper removed Cologne, aftershave, perfume and deodorant removed Jewelry removed (may wrap wedding band) Make-up removed Hair care products removed Battery operated devices (external) removed Heating patches and chemical warmers removed NA Titanium eyewear removed Nail polish cured greater than 10 hours NA Casting material cured greater than 10 hours NA Hearing aids removed NA Loose dentures or partials removed NA Prosthetics have been removed Patient demonstrates  correct use of air break device (if applicable) Patient concerns have been addressed Patient grounding bracelet on and cord attached to chamber Specifics for Inpatients (complete in addition to above) Medication sheet sent with patient [name], [name] ([id number]) Intravenous medications needed or due during therapy sent with patient Drainage tubes (  e.g. nasogastric tube or chest tube secured and vented) Endotracheal or Tracheotomy tube secured Cuff deflated of air and inflated with saline Airway suctioned Electronic Signature(s) Signed: 02/19/2015 2:43:31 PM By: [name] MD Entered By: [name] on 02/19/2015 14:43:30

## 2015-02-19 NOTE — Progress Notes (Signed)
See i heal 

## 2015-02-19 NOTE — Progress Notes (Signed)
[name], [name] ([id number]) Visit Report for 02/19/2015 Arrival Information Details Patient Name: [name], [name] 02/19/2015 10:00 Date of Service: AM Medical Record [id number] Number: Patient Account Number: [id number] 11-May-1976 (39 y.o. Treating RN: Date of Birth/Sex: Female) Other Clinician: Primary Care Physician: [name], [name] Referring Physician: [name] Physician/Extender: Weeks in Treatment: 11 Visit Information History Since Last Visit Added or deleted any medications: No Patient Arrived: Ambulatory Any new allergies or adverse reactions: No Arrival Time: 09:25 Had a fall or experienced change in No Accompanied By: self activities of daily living that may affect Transfer Assistance: None risk of falls: Patient Identification Verified: Yes Signs or symptoms of abuse/neglect No Secondary Verification Process Yes since last visito Completed: Hospitalized since last visit: No Patient Has Alerts: Yes Has Dressing in Place as Prescribed: Yes Patient Alerts: Type II Diabetic Has Footwear/Offloading in Place as Yes 11/30/2014 ABI: Prescribed: (L) 1.24 (R) Right: Surgical Shoe with 0.92 Pressure Relief Insole Pain Present Now: No Electronic Signature(s) Signed: 02/19/2015 3:34:36 PM By: Dayton Martes RCP, RRT, CHT Entered By: Dayton Martes on 02/19/2015 09:55:40 [name], [name] -------------------------------------------------------------------------------- Encounter Discharge Information Details Patient Name: [name] 02/19/2015 10:00 Date of Service: AM Medical Record [id number] Number: Patient Account Number: [id number] 04-Dec-1975 (39 y.o. Treating RN: Date of Birth/Sex: Female) Other Clinician: Primary Care Physician: [name], [name] Referring Physician: [name] Physician/Extender: Weeks in Treatment: 11 Encounter Discharge  Information Items Discharge Pain Level: 0 Discharge Condition: Stable Ambulatory Status: Ambulatory Discharge Destination: Home Private Transportation: Auto Accompanied By: self Schedule Follow-up Appointment: No Medication Reconciliation completed and No provided to Patient/Care Jaycey Gens: Clinical Summary of Care: Notes Patient has an HBO treatment scheduled on 02/20/15 at 10:00 am. Electronic Signature(s) Signed: 02/19/2015 2:44:56 PM By: [name] MD Entered By: [name] on 02/19/2015 14:44:56 Innes, [name] -------------------------------------------------------------------------------- Patient/Caregiver Education Details Patient Name: [name] 02/19/2015 10:00 Date of Service: AM Medical Record [id number] Number: Patient Account Number: [id number] [birth year] (39 y.o. Treating RN: Date of Birth/Gender: Female) Other Clinician: Primary Care Physician: [name], [name] Referring Physician: [name] Physician/Extender: [name] in Treatment: 11 Education Assessment Education Provided To: Patient Education Topics Provided Electronic Signature(s) Signed: 02/19/2015 2:44:51 PM By: [name] MD Entered By: [name] on 02/19/2015 14:44:50 [name], [name] -------------------------------------------------------------------------------- Vitals Details Patient Name: [name] 02/19/2015 10:00 Date of Service: AM Medical Record [id number] Number: Patient Account Number: [id number] [birth year] (39 y.o. Treating RN: Date of Birth/Sex: Female) Other Clinician: Primary Care Physician: [name], [name] Referring Physician: [name] Physician/Extender: Weeks in Treatment: 11 Vital Signs Time Taken: 09:33 Temperature (F): 97.6 Height (in): 67 Pulse (bpm): 90 Weight (lbs): 290 Respiratory Rate (breaths/min): 18 Body Mass Index (BMI): 45.4 Blood  Pressure (mmHg): 150/90 Capillary Blood Glucose (mg/dl): 756 Reference Range: 80 - 120 mg / dl Electronic Signature(s) Signed: 02/19/2015 3:34:36 PM By: Dayton Martes RCP, RRT, CHT Entered By: Dayton Martes on 02/19/2015 09:56:03

## 2015-02-19 NOTE — Progress Notes (Signed)
NAHJAE, HOEG ([id number]) Visit Report for 02/19/2015 Physician Orders Details Patient Name: [name], [name] 02/19/2015 10:00 Date of Service: AM Medical Record [id number] Number: Patient Account Number: [id number] 08-19-1975 (39 y.o. Treating RN: Date of Birth/Sex: Female) Other Clinician: Primary Care Physician: [name], Marilena Trevathan Referring Physician: [name] Physician/Extender: Weeks in Treatment: 11 Verbal / Phone Orders: No Diagnosis Coding ICD-10 Coding Code Description E11.621 Type 2 diabetes mellitus with foot ulcer E08.40 Diabetes mellitus due to underlying condition with diabetic neuropathy, unspecified L97.513 Non-pressure chronic ulcer of other part of right foot with necrosis of muscle F17.218 Nicotine dependence, cigarettes, with other nicotine-induced disorders E66.01 Morbid (severe) obesity due to excess calories L03.116 Cellulitis of left lower limb 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 02/19/2015 10:00 Date of Service: AM Medical Record [id number] Number: Patient Account Number: [id number] January 14, 1976 (39 y.o. Treating RN: Date of Birth/Sex: Female) Other Clinician: Primary Care Physician: [name], Iasha Mccalister Referring Physician: [name] Physician/Extender: Weeks in Treatment: 11 Active Problems ICD-10 Encounter Code Description Active Date Diagnosis E11.621 Type 2 diabetes mellitus with foot ulcer 11/30/2014 Yes E08.40 Diabetes mellitus due to underlying condition with diabetic 11/30/2014 Yes neuropathy, unspecified L97.513 Non-pressure chronic ulcer of other part of right foot  with 11/30/2014 Yes necrosis of muscle F17.218 Nicotine dependence, cigarettes, with other nicotine- 11/30/2014 Yes induced disorders E66.01 Morbid (severe) obesity due to excess calories 11/30/2014 Yes L03.116 Cellulitis of left lower limb 02/02/2015 Yes 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 ([id number]) -------------------------------------------------------------------------------- SuperBill Details Patient Name: [name], [name] Date of Service: 02/19/2015 Medical Record Number: [id number] Patient Account Number: [id number] Date of Birth/Sex: [birth year] (39 y.o. Female) Treating RN: Primary Care Physician: [name] Other Clinician: Referring Physician: [name] Treating Physician/Extender: Elayne Snare in Treatment: 11 Diagnosis Coding ICD-10 Codes Code Description E11.621 Type 2 diabetes mellitus with foot ulcer E08.40 Diabetes mellitus due to underlying condition with diabetic neuropathy, unspecified L97.513 Non-pressure chronic ulcer of other part of right foot with necrosis of muscle F17.218 Nicotine dependence, cigarettes, with other nicotine-induced disorders E66.01 Morbid (severe) obesity due to excess calories L03.116 Cellulitis of left lower limb 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, left ankle and foot Facility Procedures CPT4 Code: 78242353 Description: (Facility Use Only) HBOT, full body chamber, Modifier: Quantity: 4 Physician Procedures CPT4 Code: 6144315 Description: 99211 - WC PHYS LEVEL 1 EST PT ICD-10 Description Diagnosis E11.621 Type 2 diabetes mellitus with foot ulcer Modifier: Quantity: 1 CPT4 Code: 4008676 Description: 99183 - WC PHYS HYPERBARIC OXYGEN THERAPY ICD-10  Description Diagnosis E11.621 Type 2 diabetes mellitus with foot ulcer 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, left ankle and foo Modifier: t Quantity: 1 Electronic Signature(s) Signed: 02/19/2015 2:44:19 PM By: [name] MD Entered By: [name] on 02/19/2015 14:44:18

## 2015-02-20 ENCOUNTER — Encounter (HOSPITAL_BASED_OUTPATIENT_CLINIC_OR_DEPARTMENT_OTHER): Payer: Medicaid Other | Admitting: General Surgery

## 2015-02-20 DIAGNOSIS — L97509 Non-pressure chronic ulcer of other part of unspecified foot with unspecified severity: Secondary | ICD-10-CM

## 2015-02-20 DIAGNOSIS — E10621 Type 1 diabetes mellitus with foot ulcer: Secondary | ICD-10-CM | POA: Diagnosis not present

## 2015-02-20 DIAGNOSIS — L97513 Non-pressure chronic ulcer of other part of right foot with necrosis of muscle: Secondary | ICD-10-CM | POA: Diagnosis not present

## 2015-02-20 LAB — GLUCOSE, CAPILLARY
Glucose-Capillary: 319 mg/dL — ABNORMAL HIGH (ref 65–99)
Glucose-Capillary: 233 mg/dL — ABNORMAL HIGH (ref 65–99)

## 2015-02-20 NOTE — Progress Notes (Signed)
[name], [name] ([id number]) Visit Report for 02/20/2015 Physician Orders Details Patient Name: [name], [name] 02/20/2015 10:00 Date of Service: AM Medical Record [id number] Number: Patient Account Number: [id number] 1975/09/10 (39 y.o. Treating RN: Date of Birth/Sex: Female) Other Clinician: [name] Primary Care Physician: [name], Cris Gibby Referring Physician: [name] Physician/Extender: [name] in Treatment: 11 Verbal / Phone Orders: No Diagnosis Coding ICD-10 Coding Code Description E11.621 Type 2 diabetes mellitus with foot ulcer E08.40 Diabetes mellitus due to underlying condition with diabetic neuropathy, unspecified L97.513 Non-pressure chronic ulcer of other part of right foot with necrosis of muscle F17.218 Nicotine dependence, cigarettes, with other nicotine-induced disorders E66.01 Morbid (severe) obesity due to excess calories L03.116 Cellulitis of left lower limb 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 02/20/2015 10:00 Date of Service: AM Medical Record [id number] Number: Patient Account Number: [id number] [birth year] (39 y.o. Treating RN: Date of Birth/Sex: Female) Other Clinician: [name] Primary Care Physician: [name], Dwon Sky Referring Physician: [name] Physician/Extender: [name] in Treatment: 11 Active Problems ICD-10 Encounter Code Description Active Date Diagnosis E11.621 Type 2 diabetes mellitus with foot ulcer 11/30/2014 Yes E08.40 Diabetes mellitus due to underlying condition with diabetic 11/30/2014 Yes neuropathy, unspecified L97.513 Non-pressure chronic  ulcer of other part of right foot with 11/30/2014 Yes necrosis of muscle F17.218 Nicotine dependence, cigarettes, with other nicotine- 11/30/2014 Yes induced disorders E66.01 Morbid (severe) obesity due to excess calories 11/30/2014 Yes L03.116 Cellulitis of left lower limb 02/02/2015 Yes 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 -------------------------------------------------------------------------------- SuperBill Details Patient Name: [name], [name] Date of Service: 02/20/2015 Medical Record Number: [id number] Patient Account Number: [id number] Date of Birth/Sex: May 04, 1976 (39 y.o. Female) Treating RN: Primary Care Physician: [name] Other Clinician: [name] Referring Physician: [name] Treating Physician/Extender: Elayne Snare in Treatment: 11 Diagnosis Coding ICD-10 Codes Code Description E11.621 Type 2 diabetes mellitus with foot ulcer E08.40 Diabetes mellitus due to underlying condition with diabetic neuropathy, unspecified L97.513 Non-pressure chronic ulcer of other part of right foot with necrosis of muscle F17.218 Nicotine dependence, cigarettes, with other nicotine-induced disorders E66.01 Morbid (severe) obesity due to excess calories L03.116 Cellulitis of left lower limb 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

## 2015-02-20 NOTE — Progress Notes (Signed)
See i heal 

## 2015-02-20 NOTE — Progress Notes (Signed)
SALVATORE, POE ([id number]) Visit Report for 02/20/2015 Arrival Information Details Patient Name: [name], [name] 02/20/2015 10:00 Date of Service: AM Medical Record [id number] Number: Patient Account Number: [id number] 10-Oct-1975 (39 y.o. Treating RN: Date of Birth/Sex: Female) Other Clinician: [name] Primary Care Physician: [name], [name] Referring Physician: [name] Physician/Extender: [name] in Treatment: 11 Visit Information History Since Last Visit Added or deleted any medications: No Patient Arrived: Ambulatory Any new allergies or adverse reactions: No Arrival Time: 09:20 Had a fall or experienced change in No Accompanied By: self activities of daily living that may affect Transfer Assistance: None risk of falls: Patient Identification Verified: Yes Signs or symptoms of abuse/neglect No Secondary Verification Process Yes since last visito Completed: Hospitalized since last visit: No Patient Has Alerts: Yes Has Dressing in Place as Prescribed: Yes Patient Alerts: Type II Diabetic Has Footwear/Offloading in Place as Yes 11/30/2014 ABI: Prescribed: (L) 1.24 (R) Left: Surgical Shoe with 0.92 Pressure Relief Insole Right: Surgical Shoe with Pressure Relief Insole Pain Present Now: No Electronic Signature(s) Signed: 02/20/2015 3:31:36 PM By: Dayton Martes RCP, RRT, CHT Entered By: Dayton Martes on 02/20/2015 09:28:43 Diehl, [name] ([id number]) -------------------------------------------------------------------------------- Encounter Discharge Information Details Patient Name: [name] Christine. 02/20/2015 10:00 Date of Service: AM Medical Record [id number] Number: Patient Account Number: [id number] 01-21-1976 (39 y.o. Treating RN: Date of Birth/Sex: Female) Other Clinician: [name] Primary Care Physician: [name], [name] Referring Physician:  [name] Physician/Extender: [name] in Treatment: 11 Encounter Discharge Information Items Discharge Pain Level: 0 Discharge Condition: Stable Ambulatory Status: Ambulatory Discharge Destination: Home Private Transportation: Auto Accompanied By: self Schedule Follow-up Appointment: No Medication Reconciliation completed and No provided to Patient/Care Essa Wenk: Clinical Summary of Care: Notes Patient has an HBO treatment scheduled on 02/21/15 at 10:00 am. Electronic Signature(s) Signed: 02/20/2015 12:11:17 PM By: [name] MD Entered By: [name] on 02/20/2015 12:11:17 Millman, [name] ([id number]) -------------------------------------------------------------------------------- Patient/Caregiver Education Details Patient Name: [name], SKLUZACEK. 02/20/2015 10:00 Date of Service: AM Medical Record [id number] Number: Patient Account Number: [id number] October 18, 1975 (39 y.o. Treating RN: Date of Birth/Gender: Female) Other Clinician: [name] Primary Care Physician: [name], [name] Referring Physician: [name] Physician/Extender: [name] in Treatment: 11 Education Assessment Education Provided To: Patient Education Topics Provided Electronic Signature(s) Signed: 02/20/2015 12:11:09 PM By: [name] MD Entered By: [name] on 02/20/2015 12:11:09 Worrell, [name] ([id number]) -------------------------------------------------------------------------------- Vitals Details Patient Name: [name], SCHEIDEGGER. 02/20/2015 10:00 Date of Service: AM Medical Record [id number] Number: Patient Account Number: [id number] March 01, 1976 (39 y.o. Treating RN: Date of Birth/Sex: Female) Other Clinician: [name] Primary Care Physician: [name], [name] Referring Physician: [name] Physician/Extender: Weeks in Treatment: 11 Vital Signs Time Taken: 09:23 Temperature (F): 97.7 Height (in):  67 Pulse (bpm): 102 Weight (lbs): 290 Respiratory Rate (breaths/min): 18 Body Mass Index (BMI): 45.4 Blood Pressure (mmHg): 142/90 Capillary Blood Glucose (mg/dl): 756 Reference Range: 80 - 120 mg / dl Electronic Signature(s) Signed: 02/20/2015 3:31:36 PM By: Dayton Martes RCP, RRT, CHT Entered By: Dayton Martes on 02/20/2015 09:29:06

## 2015-02-20 NOTE — Progress Notes (Signed)
[name], [name] ([id number]) Visit Report for 02/20/2015 HBO Details Patient Name: [name], [name] 02/20/2015 10:00 Date of Service: AM Medical Record [id number] Number: Patient Account Number: [id number] 01-Feb-1976 (39 y.o. Treating RN: Date of Birth/Sex: Female) Other Clinician: Izetta [name] Primary Care Physician: [name] Referring Physician: Sherrie [name] Physician/Extender: [name] in Treatment: 11 HBO Treatment Course Details Treatment Course Ordering Physician: [name] 1 Number: HBO Treatment Start Date: 02/19/2015 Total Treatments 30 Ordered: HBO Indication: Diabetic Ulcer(s) of the Lower Extremity HBO Treatment Details Treatment Number: 2 Patient Type: Outpatient Chamber Type: Monoplace Chamber #: YQM#[id number] Treatment Protocol: 2.0 ATA with 90 minutes oxygen, and no air breaks Treatment Details Compression Rate Down: 1.5 psi / minute De-Compression Rate Up: 1.5 psi / minute Air breaks and breathing Compress Tx Pressure Decompress Decompress periods Begins Reached Begins Ends (leave unused spaces blank) Chamber Pressure 1 ATA 2.0 ATA - - - - - - 2.0 ATA 1 ATA Clock Time (24 hr) 09:33 09:45 - - - - - - 11:15 11:26 Treatment Length: 113 (minutes) Treatment Segments: 4 Capillary Blood Glucose Pre Capillary Blood Glucose (mg/dl): Post Capillary Blood Glucose (mg/dl): Vital Signs Capillary Blood Glucose Reference Range: 80 - 120 mg / dl HBO Diabetic Blood Glucose Intervention Range: <131 mg/dl or >295 mg/dl Time Vitals Blood Respiratory Capillary Blood Glucose Pulse Action Type: Pulse: Temperature: Taken: Pressure: Rate: Glucose (mg/dl): Meter #: Oximetry (%) Taken: Pre 09:23 142/90 102 18 97.7 319 1 none Post 11:29 140/90 84 18 97.6 233 1 none Treatment Response [name], [name] ([id number]) Treatment Completion Status: Treatment Completed without Adverse Event HBO Attestation I certify that I  supervised this HBO treatment in accordance with Medicare guidelines. A trained Yes emergency response team is readily available per hospital policies and procedures. Continue HBOT as ordered. Yes Electronic Signature(s) Signed: 02/20/2015 12:09:13 PM By: [name] Entered By: [name] Sax on 02/20/2015 12:09:13 [name], [name] ([id number]) -------------------------------------------------------------------------------- HBO Safety Checklist Details Patient Name: [name], [name] 02/20/2015 10:00 Date of Service: AM Medical Record [id number] Number: Patient Account Number: [id number] [birth year] (39 y.o. Treating RN: Date of Birth/Sex: Female) Other Clinician: Izetta [name] Primary Care Physician: [name] Referring Physician: Sherrie [name] Physician/Extender: [name] in Treatment: 11 HBO Safety Checklist Items Safety Checklist Consent Form Signed Patient voided / foley secured and emptied When did you last eato 08:00 am Last dose of injectable or oral agent 08:00 am NA Ostomy pouch emptied and vented if applicable NA All implantable devices assessed, documented and approved NA Intravenous access site secured and place Valuables secured Linens and cotton and cotton/polyester blend (less than 51% polyester) Personal oil-based products / skin lotions / body lotions removed NA Wigs or hairpieces removed Smoking or tobacco materials removed Books / newspapers / magazines / loose paper removed Cologne, aftershave, perfume and deodorant removed Jewelry removed (may wrap wedding band) Make-up removed Hair care products removed Battery operated devices (external) removed Heating patches and chemical warmers removed NA Titanium eyewear removed Nail polish cured greater than 10 hours NA Casting material cured greater than 10 hours NA Hearing aids removed NA Loose dentures or partials removed NA Prosthetics have been removed Patient  demonstrates correct use of air break device (if applicable) Patient concerns have been addressed Patient grounding bracelet on and cord attached to chamber Specifics for Inpatients (complete in addition to above) Medication sheet sent with patient [name], TANKSLEY. ([id number]) Intravenous medications needed or due during therapy sent with patient Drainage tubes (  e.g. nasogastric tube or chest tube secured and vented) Endotracheal or Tracheotomy tube secured Cuff deflated of air and inflated with saline Airway suctioned Electronic Signature(s) Signed: 02/20/2015 12:08:57 PM By: [name] Entered By: [name] Sax on 02/20/2015 12:08:57

## 2015-02-21 ENCOUNTER — Encounter: Payer: Medicaid Other | Admitting: Surgery

## 2015-02-21 DIAGNOSIS — L97513 Non-pressure chronic ulcer of other part of right foot with necrosis of muscle: Secondary | ICD-10-CM | POA: Diagnosis not present

## 2015-02-21 LAB — GLUCOSE, CAPILLARY
GLUCOSE-CAPILLARY: 241 mg/dL — AB (ref 65–99)
Glucose-Capillary: 252 mg/dL — ABNORMAL HIGH (ref 65–99)

## 2015-02-21 NOTE — Progress Notes (Signed)
KELLIN, FIFER ([id number]) Visit Report for 02/21/2015 Arrival Information Details Patient Name: [name], [name] Date of Service: 02/21/2015 10:00 AM Medical Record Patient Account Number: [id number] Number: Treating RN: 1975/11/29 (39 y.o. Other Clinician: [name] Date of Birth/Sex: Female) Treating BURNS III, Primary Care Physician: [name] Physician/Extender: Zollie Beckers Referring Physician: [name] Weeks in Treatment: 11 Visit Information History Since Last Visit Added or deleted any medications: No Patient Arrived: Ambulatory Any new allergies or adverse reactions: No Arrival Time: 09:30 Had a fall or experienced change in No Accompanied By: self activities of daily living that may affect Transfer Assistance: None risk of falls: Patient Identification Verified: Yes Signs or symptoms of abuse/neglect No Secondary Verification Process Yes since last visito Completed: Hospitalized since last visit: No Patient Has Alerts: Yes Has Dressing in Place as Prescribed: Yes Patient Alerts: Type II Diabetic Has Footwear/Offloading in Place as Yes 11/30/2014 ABI: Prescribed: (L) 1.24 (R) Left: Surgical Shoe with 0.92 Pressure Relief Insole Right: Surgical Shoe with Pressure Relief Insole Pain Present Now: No Electronic Signature(s) Signed: 02/21/2015 12:05:34 PM By: Dayton Martes RCP, RRT, CHT Entered By: Weyman Rodney, Lucio Edward on 02/21/2015 09:34:04 [name], [name] -------------------------------------------------------------------------------- Encounter Discharge Information Details Patient Name: [name], [name] Date of Service: 02/21/2015 10:00 AM Medical Record Patient Account Number: [id number] Number: Treating RN: Mar 14, 1976 (39 y.o. Other Clinician: [name] Date of Birth/Sex: Female) Treating BURNS III, Primary Care Physician: [name] Physician/Extender:  Zollie Beckers Referring Physician: [name] Weeks in Treatment: 11 Encounter Discharge Information Items Discharge Pain Level: 0 Discharge Condition: Stable Ambulatory Status: Ambulatory Discharge Destination: Home Private Transportation: Auto Accompanied By: self Schedule Follow-up Appointment: No Medication Reconciliation completed and No provided to Patient/Care Farin Buhman: Clinical Summary of Care: Notes Patient has an HBO treatment scheduled on 02/22/15 at 10:00 am. Electronic Signature(s) Signed: 02/21/2015 12:05:34 PM By: Dayton Martes RCP, RRT, CHT Entered By: Dayton Martes on 02/21/2015 11:49:27 [name], [name] -------------------------------------------------------------------------------- Vitals Details Patient Name: [name], [name] Date of Service: 02/21/2015 10:00 AM Medical Record Patient Account Number: [id number] Number: Treating RN: 16-Jun-1976 (39 y.o. Other Clinician: [name] Date of Birth/Sex: Female) Treating BURNS III, Primary Care Physician: [name] Physician/Extender: Zollie Beckers Referring Physician: [name] Weeks in Treatment: 11 Vital Signs Time Taken: 09:30 Temperature (F): 97.9 Height (in): 67 Pulse (bpm): 84 Weight (lbs): 290 Respiratory Rate (breaths/min): 18 Body Mass Index (BMI): 45.4 Blood Pressure (mmHg): 138/94 Capillary Blood Glucose (mg/dl): 086 Reference Range: 80 - 120 mg / dl Electronic Signature(s) Signed: 02/21/2015 12:05:34 PM By: Dayton Martes RCP, RRT, CHT Entered By: Dayton Martes on 02/21/2015 09:34:43

## 2015-02-22 ENCOUNTER — Encounter: Payer: Medicaid Other | Admitting: Surgery

## 2015-02-22 NOTE — Progress Notes (Addendum)
[name] ([id number]) Visit Report for 02/21/2015 HBO Details Patient Name: [name], [name] Date of Service: 02/21/2015 10:00 AM Medical Record Patient Account Number: [id number] Number: Treating RN: [birth year] (39 y.o. Other Clinician: [name] Date of Birth/Sex: Female) Treating BURNS III, Primary Care Physician: [name] Physician/Extender: Zollie Beckers Referring Physician: [name] Weeks in Treatment: 11 HBO Treatment Course Details Treatment Course Ordering Physician: [name] 1 Number: HBO Treatment Start Date: 02/19/2015 Total Treatments 30 Ordered: HBO Indication: Diabetic Ulcer(s) of the Lower Extremity HBO Treatment Details Treatment Number: 3 Patient Type: Outpatient Chamber Type: Monoplace Chamber #: [id number] Treatment Protocol: 2.0 ATA with 90 minutes oxygen, and no air breaks Treatment Details Compression Rate Down: 1.5 psi / minute De-Compression Rate Up: 1.5 psi / minute Air breaks and breathing Compress Tx Pressure Decompress Decompress periods Begins Reached Begins Ends (leave unused spaces blank) Chamber Pressure 1 ATA 2.0 ATA - - - - - - 2.0 ATA 1 ATA Clock Time (24 hr) 09:39 09:51 - - - - - - 11:22 11:33 Treatment Length: 114 (minutes) Treatment Segments: 4 Capillary Blood Glucose Pre Capillary Blood Glucose (mg/dl): Post Capillary Blood Glucose (mg/dl): Vital Signs Capillary Blood Glucose Reference Range: 80 - 120 mg / dl HBO Diabetic Blood Glucose Intervention Range: <131 mg/dl or >829 mg/dl Time Vitals Blood Respiratory Capillary Blood Glucose Pulse Action Type: Pulse: Temperature: Taken: Pressure: Rate: Glucose (mg/dl): Meter #: Oximetry (%) Taken: Pre 09:30 138/94 84 18 97.9 252 1 none Post 11:35 140/92 78 18 97.9 241 1 none Treatment Response [name], [name] ([id number]) Treatment Completion Status: Treatment Completed without Adverse Event HBO Attestation I certify that I  supervised this HBO treatment in accordance with Medicare guidelines. A trained Yes emergency response team is readily available per hospital policies and procedures. Continue HBOT as ordered. Yes Electronic Signature(s) Signed: 02/28/2015 8:07:08 AM By: Madelaine Bhat MD Previous Signature: 02/21/2015 12:05:34 PM Version By: Dayton Martes RCP, RRT, CHT Previous Signature: 02/21/2015 3:59:25 PM Version By: Madelaine Bhat MD Previous Signature: 02/21/2015 10:59:09 AM Version By: Madelaine Bhat MD Entered By: Madelaine Bhat on 02/28/2015 08:05:20 [name], [name] ([id number]) -------------------------------------------------------------------------------- HBO Safety Checklist Details Patient Name: [name], [name] Date of Service: 02/21/2015 10:00 AM Medical Record Patient Account Number: [id number] Number: Treating RN: Mar 24, 1976 (39 y.o. Other Clinician: [name] Date of Birth/Sex: Female) Treating BURNS III, Primary Care Physician: [name] Physician/Extender: Zollie Beckers Referring Physician: [name] Weeks in Treatment: 11 HBO Safety Checklist Items Safety Checklist Consent Form Signed Patient voided / foley secured and emptied When did you last eato 08:00 am Last dose of injectable or oral agent 08:00 am NA Ostomy pouch emptied and vented if applicable NA All implantable devices assessed, documented and approved NA Intravenous access site secured and place Valuables secured Linens and cotton and cotton/polyester blend (less than 51% polyester) Personal oil-based products / skin lotions / body lotions removed NA Wigs or hairpieces removed Smoking or tobacco materials removed Books / newspapers / magazines / loose paper removed Cologne, aftershave, perfume and deodorant removed Jewelry removed (may wrap wedding band) Make-up removed Hair care products removed Battery operated devices (external) removed Heating  patches and chemical warmers removed NA Titanium eyewear removed Nail polish cured greater than 10 hours NA Casting material cured greater than 10 hours NA Hearing aids removed NA Loose dentures or partials removed NA Prosthetics have been removed Patient demonstrates correct use of air break device (if applicable) Patient concerns have been  addressed Patient grounding bracelet on and cord attached to chamber Specifics for Inpatients (complete in addition to above) Medication sheet sent with patient [name], [name] MATURA. ([id number]) Intravenous medications needed or due during therapy sent with patient Drainage tubes (e.g. nasogastric tube or chest tube secured and vented) Endotracheal or Tracheotomy tube secured Cuff deflated of air and inflated with saline Airway suctioned Electronic Signature(s) Signed: 02/21/2015 12:05:34 PM By: Dayton Martes RCP, RRT, CHT Entered By: Dayton Martes on 02/21/2015 09:39:51

## 2015-02-23 ENCOUNTER — Encounter: Payer: Medicaid Other | Admitting: Surgery

## 2015-02-23 ENCOUNTER — Encounter: Payer: Medicaid Other | Attending: Surgery | Admitting: Surgery

## 2015-02-23 DIAGNOSIS — M86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, left ankle and foot: Secondary | ICD-10-CM | POA: Insufficient documentation

## 2015-02-23 DIAGNOSIS — E11621 Type 2 diabetes mellitus with foot ulcer: Secondary | ICD-10-CM | POA: Diagnosis not present

## 2015-02-23 DIAGNOSIS — E1140 Type 2 diabetes mellitus with diabetic neuropathy, unspecified: Secondary | ICD-10-CM | POA: Insufficient documentation

## 2015-02-23 DIAGNOSIS — L03116 Cellulitis of left lower limb: Secondary | ICD-10-CM | POA: Insufficient documentation

## 2015-02-23 DIAGNOSIS — L97513 Non-pressure chronic ulcer of other part of right foot with necrosis of muscle: Secondary | ICD-10-CM | POA: Insufficient documentation

## 2015-02-23 DIAGNOSIS — F17218 Nicotine dependence, cigarettes, with other nicotine-induced disorders: Secondary | ICD-10-CM | POA: Diagnosis not present

## 2015-02-23 LAB — GLUCOSE, CAPILLARY
GLUCOSE-CAPILLARY: 341 mg/dL — AB (ref 65–99)
Glucose-Capillary: 258 mg/dL — ABNORMAL HIGH (ref 65–99)

## 2015-02-23 NOTE — Progress Notes (Signed)
MERSADIES, PETREE ([id number]) Visit Report for 02/23/2015 Arrival Information Details Patient Name: [name], [name] Date of Service: 02/23/2015 8:00 AM Medical Record Number: [id number] Patient Account Number: [id number] Date of Birth/Sex: [birth year] (39 y.o. Female) Treating RN: Primary Care Physician: [name] Other Clinician: [name] Referring Physician: [name] Treating Physician/Extender: [name] in Treatment: 12 Visit Information History Since Last Visit Added or deleted any medications: No Patient Arrived: Ambulatory Any new allergies or adverse reactions: No Arrival Time: 08:45 Had a fall or experienced change in No Accompanied By: self activities of daily living that may affect Transfer Assistance: None risk of falls: Patient Identification Verified: Yes Signs or symptoms of abuse/neglect No Secondary Verification Process Yes since last visito Completed: Hospitalized since last visit: No Patient Has Alerts: Yes Has Dressing in Place as Prescribed: Yes Patient Alerts: Type II Diabetic Has Footwear/Offloading in Place as Yes 11/30/2014 ABI: Prescribed: (L) 1.24 (R) Left: Surgical Shoe with 0.92 Pressure Relief Insole Right: Surgical Shoe with Pressure Relief Insole Pain Present Now: No Electronic Signature(s) Signed: 02/23/2015 11:23:30 AM By: Dayton Martes RCP, RRT, CHT Entered By: Dayton Martes on 02/23/2015 09:18:56 [place]field, [name] ([id number]) -------------------------------------------------------------------------------- Encounter Discharge Information Details Patient Name: [name], [name] Date of Service: 02/23/2015 8:00 AM Medical Record Number: [id number] Patient Account Number: [id number] Date of Birth/Sex: Oct 23, 1975 (39 y.o. Female) Treating RN: Primary Care Physician: [name] Other Clinician: [name] Referring Physician: [name] Treating  Physician/Extender: [name] in Treatment: 12 Encounter Discharge Information Items Discharge Pain Level: 0 Discharge Condition: Stable Ambulatory Status: Ambulatory Discharge Destination: Home Private Transportation: Auto Accompanied By: self Schedule Follow-up Appointment: No Medication Reconciliation completed and No provided to Patient/Care Ettamae Barkett: Clinical Summary of Care: Notes Patient has an HBO treatment scheduled on 02/27/15 at 10:00 am. Electronic Signature(s) Signed: 02/23/2015 11:23:30 AM By: Dayton Martes RCP, RRT, CHT Entered By: Dayton Martes on 02/23/2015 11:22:30 Gasner, [name] ([id number]) -------------------------------------------------------------------------------- Vitals Details Patient Name: [name], [name] Date of Service: 02/23/2015 8:00 AM Medical Record Number: [id number] Patient Account Number: [id number] Date of Birth/Sex: [birth year] (39 y.o. Female) Treating RN: Primary Care Physician: [name] Other Clinician: [name] Referring Physician: [name] Treating Physician/Extender: [name] in Treatment: 12 Vital Signs Time Taken: 08:49 Temperature (F): 97.5 Height (in): 67 Pulse (bpm): 102 Weight (lbs): 290 Respiratory Rate (breaths/min): 18 Body Mass Index (BMI): 45.4 Blood Pressure (mmHg): 138/78 Capillary Blood Glucose (mg/dl): 742 Reference Range: 80 - 120 mg / dl Electronic Signature(s) Signed: 02/23/2015 11:23:30 AM By: Dayton Martes RCP, RRT, CHT Entered By: Dayton Martes on 02/23/2015 09:19:21

## 2015-02-23 NOTE — Progress Notes (Signed)
[name], [name] ([id number]) Visit Report for 02/23/2015 HBO Details Patient Name: [name], [name] Date of Service: 02/23/2015 8:00 AM Medical Record Number: [id number] Patient Account Number: [id number] Date of Birth/Sex: Jul 10, 1975 (39 y.o. Female) Treating RN: Primary Care Physician: [name] Other Clinician: [name] Referring Physician: [name] Treating Physician/Extender: [name] in Treatment: 12 HBO Treatment Course Details Treatment Course Ordering Physician: [name] 1 Number: HBO Treatment Start Date: 02/19/2015 Total Treatments 30 Ordered: HBO Indication: Diabetic Ulcer(s) of the Lower Extremity HBO Treatment Details Treatment Number: 4 Patient Type: Outpatient Chamber Type: Monoplace Chamber #: [id number] Treatment Protocol: 2.0 ATA with 90 minutes oxygen, and no air breaks Treatment Details Compression Rate Down: 1.5 psi / minute De-Compression Rate Up: 1.5 psi / minute Air breaks and breathing Compress Tx Pressure Decompress Decompress periods Begins Reached Begins Ends (leave unused spaces blank) Chamber Pressure 1 ATA 2.0 ATA - - - - - - 2.0 ATA 1 ATA Clock Time (24 hr) 09:00 09:15 - - - - - - 10:45 10:56 Treatment Length: 116 (minutes) Treatment Segments: 4 Capillary Blood Glucose Pre Capillary Blood Glucose (mg/dl): Post Capillary Blood Glucose (mg/dl): Vital Signs Capillary Blood Glucose Reference Range: 80 - 120 mg / dl HBO Diabetic Blood Glucose Intervention Range: <131 mg/dl or >865 mg/dl Time Vitals Blood Respiratory Capillary Blood Glucose Pulse Action Type: Pulse: Temperature: Taken: Pressure: Rate: Glucose (mg/dl): Meter #: Oximetry (%) Taken: Pre 08:49 138/78 102 18 97.5 341 1 none Treatment Response Treatment Completion Status: Treatment Completed without Adverse Event Treatment Notes [name], [name] ([id number]) At 1.3 ATA patient complained of left ear pain as she could not clear her  ear. Pressure was leveled off at this point and patient make several attempts to clear her ear and was finally successful. Pressure setting was increased gradually to prescribed level set and patient continued to perform pressure clearing maneuvers until reading prescribed pressure. She had no more pain so treatment was continued. Physician Notes she had mild irritation in her left ear and popping during the HBO treatment and on postprocedure review of her ears reveal the right ear was normal with a Teeds score of 0 and the left ear had her mild erythema with Teeeds score of 2 Post Treatment Teed Score Post Treatment Teed Score: Left Ear Grade II Post Treatment Teed Score: Right Ear Grade 0 HBO Attestation I certify that I supervised this HBO treatment in accordance with Medicare guidelines. A trained Yes emergency response team is readily available per hospital policies and procedures. Continue HBOT as ordered. Yes Electronic Signature(s) Signed: 02/23/2015 11:21:49 AM By: [name] MD, FACS Entered By: [name] on 02/23/2015 11:21:49 [name] ([id number]) -------------------------------------------------------------------------------- HBO Safety Checklist Details Patient Name: [name], [name] Date of Service: 02/23/2015 8:00 AM Medical Record Number: [id number] Patient Account Number: [id number] Date of Birth/Sex: [birth year] (39 y.o. Female) Treating RN: Primary Care Physician: [name] Other Clinician: [name] Referring Physician: [name] Treating Physician/Extender: [name] in Treatment: 12 HBO Safety Checklist Items Safety Checklist Consent Form Signed Patient voided / foley secured and emptied When did you last eato 19:00 pm on 02/22/15 Last dose of injectable or oral agent 19:00 pm on 02/22/15 NA Ostomy pouch emptied and vented if applicable NA All implantable devices assessed, documented and approved NA Intravenous  access site secured and place Valuables secured Linens and cotton and cotton/polyester blend (less than 51% polyester) Personal oil-based products / skin lotions / body lotions removed Wigs or hairpieces  removed Smoking or tobacco materials removed Books / newspapers / magazines / loose paper removed Cologne, aftershave, perfume and deodorant removed Jewelry removed (may wrap wedding band) Make-up removed Hair care products removed Battery operated devices (external) removed Heating patches and chemical warmers removed NA Titanium eyewear removed NA Nail polish cured greater than 10 hours NA Casting material cured greater than 10 hours NA Hearing aids removed NA Loose dentures or partials removed NA Prosthetics have been removed Patient demonstrates correct use of air break device (if applicable) Patient concerns have been addressed Patient grounding bracelet on and cord attached to chamber Specifics for Inpatients (complete in addition to above) Medication sheet sent with patient Intravenous medications needed or due during therapy sent with patient [name], [name] ([id number]) Drainage tubes (e.g. nasogastric tube or chest tube secured and vented) Endotracheal or Tracheotomy tube secured Cuff deflated of air and inflated with saline Airway suctioned Electronic Signature(s) Signed: 02/23/2015 11:23:30 AM By: Dayton Martes RCP, RRT, CHT Entered By: Dayton Martes on 02/23/2015 09:20:17

## 2015-02-23 NOTE — Progress Notes (Signed)
[name], [name] ([id number]) Visit Report for 02/23/2015 Arrival Information Details Patient Name: [name], [name] Date of Service: 02/23/2015 10:15 AM Medical Record Number: [id number] Patient Account Number: [id number] Date of Birth/Sex: 1976/03/27 (39 y.o. Female) Treating RN: Huel Coventry Primary Care Physician: [name] Other Clinician: Referring Physician: [name] Treating Physician/Extender: [name] in Treatment: 12 Visit Information History Since Last Visit Added or deleted any medications: No Patient Arrived: Ambulatory Any new allergies or adverse No Arrival Time: 11:12 reactions: Accompanied By: self Had a fall or experienced change in No Transfer Assistance: None activities of daily living that may Patient Identification Verified: Yes affect Secondary Verification Process Yes risk of falls: Completed: Signs or symptoms of abuse/neglect No Patient Has Alerts: Yes since last visito Patient Alerts: Type II Diabetic Pain Present Now: Unable to 11/30/2014 ABI: Respond (L) 1.24 (R) 0.92 Notes Patient has just completed HBO treatment for today. Electronic Signature(s) Signed: 02/23/2015 2:56:30 PM By: Elliot Gurney, RN, BSN, Kim RN, BSN Entered By: Elliot Gurney, RN, BSN, Kim on 02/23/2015 11:13:22 [place], [name] ([id number]) -------------------------------------------------------------------------------- Encounter Discharge Information Details Patient Name: [name], CAELEY [name]. Date of Service: 02/23/2015 10:15 AM Medical Record Number: [id number] Patient Account Number: [id number] Date of Birth/Sex: [birth year] (39 y.o. Female) Treating RN: Primary Care Physician: [name] Other Clinician: Referring Physician: [name] Treating Physician/Extender: [name] in Treatment: 12 Encounter Discharge Information Items Schedule Follow-up Appointment: No Medication Reconciliation completed No and provided to Patient/Care  Sidney Kann: Provided on Clinical Summary of Care: 02/23/2015 Form Type Recipient Paper Patient LS Electronic Signature(s) Signed: 02/23/2015 11:41:39 AM By: Gwenlyn Perking Entered By: Gwenlyn Perking on 02/23/2015 11:41:39 [place], [name] ([id number]) -------------------------------------------------------------------------------- Lower Extremity Assessment Details Patient Name: [name], [name] Date of Service: 02/23/2015 10:15 AM Medical Record Number: [id number] Patient Account Number: [id number] Date of Birth/Sex: February 02, 1976 (39 y.o. Female) Treating RN: Huel Coventry Primary Care Physician: [name] Other Clinician: Referring Physician: [name] Treating Physician/Extender: [name] in Treatment: 12 Vascular Assessment Pulses: Posterior Tibial Dorsalis Pedis Palpable: [Left:Yes] Extremity colors, hair growth, and conditions: Extremity Color: [Left:Normal] Hair Growth on Extremity: [Left:Yes] Temperature of Extremity: [Left:Warm] Capillary Refill: [Left:< 3 seconds] Toe Nail Assessment Left: Right: Thick: Yes Discolored: No Deformed: No Improper Length and Hygiene: No Electronic Signature(s) Signed: 02/23/2015 2:56:30 PM By: Elliot Gurney, RN, BSN, Kim RN, BSN Entered By: Elliot Gurney, RN, BSN, Kim on 02/23/2015 11:20:03 [place], [name] ([id number]) -------------------------------------------------------------------------------- Multi Wound Chart Details Patient Name: [name], [name] Date of Service: 02/23/2015 10:15 AM Medical Record Number: [id number] Patient Account Number: [id number] Date of Birth/Sex: 1975-11-29 (39 y.o. Female) Treating RN: Huel Coventry Primary Care Physician: [name] Other Clinician: Referring Physician: [name] Treating Physician/Extender: [name] in Treatment: 12 Photos: [1:No Photos] [N/A:N/A] Wound Location: [1:Left Foot - Plantar, Lateral N/A] Wounding Event: [1:Gradually Appeared] [N/A:N/A] Primary  Etiology: [1:Diabetic Wound/Ulcer of N/A the Lower Extremity] Comorbid History: [1:Type II Diabetes] [N/A:N/A] Date Acquired: [1:11/21/2013] [N/A:N/A] Weeks of Treatment: [1:12] [N/A:N/A] Wound Status: [1:Open] [N/A:N/A] Measurements L x W x D 1x0.5x1.5 [N/A:N/A] (cm) Area (cm) : [1:0.393] [N/A:N/A] Volume (cm) : [1:0.589] [N/A:N/A] % Reduction in Area: [1:82.50%] [N/A:N/A] % Reduction in Volume: 70.90% [N/A:N/A] Classification: [1:Grade 3] [N/A:N/A] Wagner Verification: [1:Abscess] [N/A:N/A] Exudate Amount: [1:Large] [N/A:N/A] Exudate Type: [1:Serosanguineous] [N/A:N/A] Exudate Color: [1:red, brown] [N/A:N/A] Foul Odor After [1:Yes] [N/A:N/A] Cleansing: Odor Anticipated Due to No [N/A:N/A] Product Use: Wound Margin: [1:Thickened] [N/A:N/A] Granulation Amount: [1:Medium (34-66%)] [N/A:N/A] Granulation Quality: [1:Pink] [N/A:N/A] Necrotic Amount: [1:Small (1-33%)] [N/A:N/A] Exposed Structures: [  1:Fascia: No Fat: No Tendon: No Muscle: No Joint: No Bone: No Limited to Skin Breakdown] [N/A:N/A] Epithelialization: [1:Small (1-33%)] [N/A:N/A] Periwound Skin Texture: [N/A:N/A] Edema: Yes Excoriation: No Induration: No Callus: No Crepitus: No Fluctuance: No Friable: No Rash: No Scarring: No Periwound Skin Moist: Yes N/A N/A Moisture: Maceration: No Dry/Scaly: No Periwound Skin Color: Erythema: Yes N/A N/A Atrophie Blanche: No Cyanosis: No Ecchymosis: No Hemosiderin Staining: No Mottled: No Pallor: No Rubor: No Erythema Location: Red Streaks N/A N/A Temperature: No Abnormality N/A N/A Tenderness on No N/A N/A Palpation: Wound Preparation: Ulcer Cleansing: N/A N/A Rinsed/Irrigated with Saline Topical Anesthetic Applied: Other: lidocaine 4% Treatment Notes Electronic Signature(s) Signed: 02/23/2015 2:56:30 PM By: Elliot Gurney, RN, BSN, Kim RN, BSN Entered By: Elliot Gurney, RN, BSN, Kim on 02/23/2015 11:20:18 [place], [name]  ([id number]) -------------------------------------------------------------------------------- Multi-Disciplinary Care Plan Details Patient Name: [name], [name] Date of Service: 02/23/2015 10:15 AM Medical Record Number: [id number] Patient Account Number: [id number] Date of Birth/Sex: 12-22-1975 (39 y.o. 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 Anxiety related to knowledge deficit of hyperbaric oxygen therapy and treatment procedures Discomfort related to temperature and humidity changes inside hyperbaric chamber Potential for barotraumas to ears, sinuses, teeth, and lungs or cerebral gas embolism related to changes in atmospheric pressure inside hyperbaric oxygen chamber Potential for oxygen toxicity seizures related to delivery of 100% oxygen at an increased atmospheric pressure Potential for pulmonary oxygen toxicity related to delivery of 100% oxygen at an increased atmospheric pressure Goals: Barotrauma will be prevented during HBO2 Date Initiated: 02/15/2015 Goal Status: Active Patient and/or family will be able to state/discuss factors appropriate to the management of their disease process during treatment Date Initiated: 02/15/2015 Goal Status: Active Patient will tolerate the hyperbaric oxygen therapy treatment Date Initiated: 02/15/2015 Goal Status: Active Patient will tolerate the internal climate of the chamber Date Initiated: 02/15/2015 Goal Status: Active Patient/caregiver will verbalize understanding of HBO goals, rationale, procedures and potential hazards Date Initiated: 02/15/2015 Goal Status: Active Signs and symptoms of pulmonary oxygen toxicity will be recognized and promptly  addressed Date Initiated: 02/15/2015 Goal Status: Active Signs and symptoms of seizure will be recognized and promptly addressed ; seizing patients will suffer no harm Date Initiated: 02/15/2015 Bocch[place], [name] ([id number]) Goal Status: Active Interventions: Administer a five (5) minute air break for patient if signs and symptoms of seizure appear and notify the hyperbaric physician Administer the correct therapeutic gas delivery based on the patients needs and limitations, per physician order Assess and provide for patientos comfort related to the hyperbaric environment and equalization of middle ear Assess for signs and symptoms related to adverse events, including but not limited to confinement anxiety, pneumothorax, oxygen toxicity and baurotrauma Assess patient for any history of confinement anxiety Assess patient's knowledge and expectations regarding hyperbaric medicine and provide education related to the hyperbaric environment, goals of treatment and prevention of adverse events Notes: Nutrition Nursing Diagnoses: Potential for alteratiion in Nutrition/Potential for imbalanced nutrition Goals: Patient/caregiver agrees to and verbalizes understanding of need to use nutritional supplements and/or vitamins as prescribed Date Initiated: 11/30/2014 Goal Status: Active Interventions: Assess patient nutrition upon admission and as needed per policy Notes: Orientation to the Wound Care Program Nursing Diagnoses: Knowledge deficit related to the wound healing center program Goals: Patient/caregiver will verbalize understanding of the Wound Healing Center  Program Date Initiated: 11/30/2014 Goal Status: Active Interventions: Provide education on orientation to the wound center Notes: Zavadil, Shalom M. ([id number]) Peripheral Neuropathy Nursing Diagnoses: Potential alteration in peripheral tissue perfusion (select prior to confirmation of  diagnosis) Goals: Patient/caregiver will verbalize understanding of disease process and disease management Date Initiated: 11/30/2014 Goal Status: Active Interventions: Assess signs and symptoms of neuropathy upon admission and as needed Notes: Wound/Skin Impairment Nursing Diagnoses: Impaired tissue integrity Knowledge deficit related to smoking impact on wound healing Goals: Ulcer/skin breakdown will have a volume reduction of 30% by week 4 Date Initiated: 11/30/2014 Goal Status: Active Interventions: Assess ulceration(s) every visit Notes: Electronic Signature(s) Signed: 02/23/2015 2:56:30 PM By: Elliot Gurney, RN, BSN, Kim RN, BSN Entered By: Elliot Gurney, RN, BSN, Kim on 02/23/2015 11:20:11 [place], [name] ([id number]) -------------------------------------------------------------------------------- Wound Assessment Details Patient Name: Daigler, Caasi M. Date of Service: 02/23/2015 10:15 AM Medical Record Number: [id number] Patient Account Number: [id number] Date of Birth/Sex: 1975/07/15 (39 y.o. Female) Treating RN: Huel Coventry Primary Care Physician: [name] Other Clinician: Referring Physician: [name] Treating Physician/Extender: [name] in Treatment: 12 Wound Status Wound Number: 1 Primary Diabetic Wound/Ulcer of the Lower Etiology: Extremity Wound Location: Left Foot - Plantar, Lateral Wound Status: Open Wounding Event: Gradually Appeared Comorbid Type II Diabetes Date Acquired: 11/21/2013 History: Weeks Of Treatment: 12 Clustered Wound: No Photos Photo Uploaded By: Elliot Gurney, RN, BSN, Kim on 02/23/2015 14:55:54 Wound Measurements Length: (cm) 1 Width: (cm) 0.5 Depth: (cm) 1.5 Area: (cm) 0.393 Volume: (cm) 0.589 % Reduction in Area: 82.5% % Reduction in Volume: 70.9% Epithelialization: Small (1-33%) Wound Description Classification: Grade 3 Wagner Verification: Abscess Wound Margin: Thickened Exudate Amount: Large Exudate Type:  Serosanguineous Exudate Color: red, brown Foul Odor After Cleansing: Yes Due to Product Use: No Wound Bed Granulation Amount: Medium (34-66%) Exposed Structure Granulation Quality: Pink Fascia Exposed: No Necrotic Amount: Small (1-33%) Fat Layer Exposed: No Azucena, Pariss M. ([id number]) Necrotic Quality: Adherent Slough Tendon Exposed: No Muscle Exposed: No Joint Exposed: No Bone Exposed: No Limited to Skin Breakdown Periwound Skin Texture Texture Color No Abnormalities Noted: No No Abnormalities Noted: No Callus: No Atrophie Blanche: No Crepitus: No Cyanosis: No Excoriation: No Ecchymosis: No Fluctuance: No Erythema: Yes Friable: No Erythema Location: Red Streaks Induration: No Hemosiderin Staining: No Localized Edema: Yes Mottled: No Rash: No Pallor: No Scarring: No Rubor: No Moisture Temperature / Pain No Abnormalities Noted: No Temperature: No Abnormality Dry / Scaly: No Maceration: No Moist: Yes Wound Preparation Ulcer Cleansing: Rinsed/Irrigated with Saline Topical Anesthetic Applied: Other: lidocaine 4%, Treatment Notes Wound #1 (Left, Lateral, Plantar Foot) 1. Cleansed with: Clean wound with Normal Saline 4. Dressing Applied: Iodoform packing Gauze 5. Secondary Dressing Applied Bordered Foam Dressing Notes [employer] darco Electronic Signature(s) Signed: 02/23/2015 2:56:30 PM By: Elliot Gurney, RN, BSN, Kim RN, BSN Entered By: Elliot Gurney, RN, BSN, Kim on 02/23/2015 11:18:14 [place]udge, [name] ([id number]) -------------------------------------------------------------------------------- Vitals Details Patient Name: Langford, [name] Date of Service: 02/23/2015 10:15 AM Medical Record Number: [id number] Patient Account Number: [id number] Date of Birth/Sex: 09/30/1975 (39 y.o. Female) Treating RN: Primary Care Physician: [name] Other Clinician: Referring Physician: [name] Treating Physician/Extender: [name] in Treatment:  12 Vital Signs Time Taken: 11:01 Temperature (F): 95.8 Height (in): 67 Pulse (bpm): 90 Weight (lbs): 290 Respiratory Rate (breaths/min): 18 Body Mass Index (BMI): 45.4 Blood Pressure (mmHg): 128/72 Capillary Blood Glucose (mg/dl): 387 Reference Range: 80 - 120 mg / dl Electronic Signature(s) Signed: 02/23/2015 11:23:30 AM By: Dayton Martes RCP, RRT, CHT  Entered By: Dayton Martes on 02/23/2015 11:23:12

## 2015-02-24 NOTE — Progress Notes (Signed)
[name], [name] ([id number]) Visit Report for 02/23/2015 Chief Complaint Document Details Patient Name: [name], [name] Date of Service: 02/23/2015 10:15 AM Medical Record Number: [id number] Patient Account Number: [id number] Date of Birth/Sex: 11-19-1975 (39 y.o. Female) Treating RN: Primary Care Physician: [name] Other Clinician: Referring Physician: [name] Treating Physician/Extender: [name] in Treatment: 12 Information Obtained from: Patient Chief Complaint Patients presents for treatment of an open diabetic ulcer. The patient has had an ulcerated wound on her left lower extremity for about 2 years. Electronic Signature(s) Signed: 02/23/2015 11:54:46 AM By: [name] MD, FACS Entered By: [name] on 02/23/2015 11:54:46 Devereux, [name] ([id number]) -------------------------------------------------------------------------------- Debridement Details Patient Name: Walraven, Lorann M. Date of Service: 02/23/2015 10:15 AM Medical Record Number: [id number] Patient Account Number: [id number] Date of Birth/Sex: [birth year] (39 y.o. Female) Treating RN: Primary Care Physician: [name] Other Clinician: Referring Physician: [name] Treating Physician/Extender: [name] in Treatment: 12 Debridement Performed for Wound #1 Left,Lateral,Plantar Foot Assessment: Performed By: Physician Tristan Schroeder., MD Debridement: Debridement Pre-procedure Yes Verification/Time Out Taken: Start Time: 11:25 Pain Control: Other : lidoxaine 4% Level: Skin/Subcutaneous Tissue Total Area Debrided (L x 1 (cm) x 0.5 (cm) = 0.5 (cm) W): Tissue and other Viable, Non-Viable, Eschar, Exudate, Fibrin/Slough, Skin, Subcutaneous material debrided: Instrument: Forceps, Scissors Bleeding: Minimum Hemostasis Achieved: Pressure End Time: 11:30 Procedural Pain: 0 Post Procedural Pain: 0 Response to Treatment: Procedure was tolerated  well Post Debridement Measurements of Total Wound Length: (cm) 1 Width: (cm) 0.7 Depth: (cm) 1.5 Volume: (cm) 0.825 Post Procedure Diagnosis Same as Pre-procedure Electronic Signature(s) Signed: 02/23/2015 11:54:41 AM By: [name] MD, FACS Entered By: [name] on 02/23/2015 11:54:40 Zaugg, [name] ([id number]) -------------------------------------------------------------------------------- HPI Details Patient Name: Heuer, Raivyn M. Date of Service: 02/23/2015 10:15 AM Medical Record Number: [id number] Patient Account Number: [id number] Date of Birth/Sex: Sep 10, 1975 (39 y.o. Female) Treating RN: Primary Care Physician: [name] Other Clinician: Referring Physician: [name] Treating Physician/Extender: [name] in Treatment: 12 History of Present Illness Location: left foot ulcer Quality: Patient reports No Pain. Severity: Wound is moderately severed with some exposed structure in the wound bed. Duration: Patient has had the wound for > 2 years prior to seeking treatment at the wound center Timing: for the last 2 years she has been treated at the [facility] wound care center Context: The wound appeared gradually over time Modifying Factors: Other treatment(s) tried include:in the last month she has been operated on the left foot for an abscess of that foot and was in hospital for 2 weeks. Associated Signs and Symptoms: Periwound excoriated and wound draining heavily HPI Description: 39 year old patient who is known to be a diabetic comes from the office of Dr. Dario Guardian for a ulcer on the left lower extremity and foot. She has had an ulcer on the left foot and has had previous treatment at [facility]. the patient tells [place] that she has been seen as the outpatient center for a long while and has had various tests including MRIs done but she is not a very good historian. Most recently about a month ago she had a abscess drained from her left foot and was in  hospital for about 2 weeks and was then discharged with a PICC line and IV antibioticstics which she knows may be vancomycin and Levaquin. She has never had a wound VAC now has she had hyperbaric oxygen therapy. Past medical history significant for dyspnea, hypertension, hyperlipidemia, neuropathy, diabetes type 2 uncontrolled. She smokes  cigarettes the latest blood reports are from March 2016 and show that her BMP is essentially normal her lipid panel was also normal her hemoglobin A1c was 9.4. 12/07/2014 -- Her tissue culture is back and she has grown significant amounts of Proteus mirabilis, pseudomonas aeruginosa and Staphylococcus aureus. The staph is a MSSA and all the cultures are sensitive to ciprofloxacin. I will start her on Cipro 500 mg by mouth twice a day for 14 days. Xray left foot -- IMPRESSION:No evidence of osteomyelitis. Healed fracture of the distal left third metatarsal. 12/21/2014 -- she is still awaiting clearance from her insurance company for HBOT. She was seen by Dr. Dario Guardian was working on controlling her diabetes and she continues to work on giving up smoking. She has completed her dosage of ciprofloxacin and we will be continuing with local care. 01/03/2015 -- her insurance clearance is still not in and we are awaiting this before starting her on a hyperbaric oxygen therapy. 01/09/2015 - we are still awaiting her insurance clearance and other than that she is doing fine. 01/26/2015 -- she was unable to tolerate the total contact cast and hence had a Darco shoe back on her left foot. Her blood sugars have been doing better and her last hemoglobin A1c report is pending. He continues to smoke 6-8 cigarettes a day. GERMAINE, [name] ([id number]) 02/02/2015 -- yesterday her left leg started getting swollen and has red streaks starting at the foot and going up to her knee. She has had a mild fever and has not gone to see her PCP or [facility] where she normally  gets her treatment for this. 02/15/2015 - she has recently been discharged from [facility] after she was admitted for a cellulitis abscess and osteomyelitis of her left foot. We haven't received all her reports yet but as per the patient she was on IV antibiotics for 10 days and then discharged home on Augmentin. Addendum -- was able to look at her records in Epic. Hospital Course: YEIMY BRABANT is a 39 y.o. female with PMH of HTN, DM, depression, and tobacco abuse who presented with left diabetic foot ulcer and cellulitis. Left leg cellulitis/Left diabetic foot ulcer with osteomyelitis: The patient presented with worsening pain and green pus drainage from her diabetic foot ulcer and surrounding cellulitis. Most recently, she was hospitalized 4/12-4/19 for this foot wound. She was treated with Vancomycin and Zosyn. ID was consulted and recommended that the patient receive Daptomycin, Ceftazidime, and Flagyl x 2 weeks. She had a PICC line placed prior to discharge. After discharge, she was transitioned to oral antibiotics. Wound cultures have grown GAS, GBS, MSSA, and MRSA.Marland Kitchen She notes that she was last treated with Ciprofloxacin for about 2 weeks and completed this course at least 2 weeks ago. She continues to follow with her wound clinic in [place] weekly. - Obtained MRI LLE which showed osteomyelitis of the left fourth metatarsal head. IMPRESSION: Findings compatible with acute osteomyelitis of the left fourth metatarsal head, with question of nondisplaced pathologic fracture. Plantar ulcer underlying the fourth MTP joint and diffuse soft tissue edema are noted. - Consulted Vascular Surgery, appreciate assistance. Performed left foot ulcer/bone debridement 8/15, sent bone for culture which showed no growth to date. - Started IV Vancomycin, Ceftaz, and Flagyl on admission. Transitioned to IV Unasyn 8/15. Given negative bone cultures, will transition to oral Augmentin for 2 week  course at discharge. - Provided wound care w/ wet to dry packing to left foot wound with  Kerlix wrap, change daily. - Per Vascular Surgery, WBAT LLE with offloading shoe. - Provided pain control with Oxycodone PRN, although pain had improved considerably prior to discharge. - Plan for follow up with Wound Clinic in [place] within 1 week after discharge. Arterial Duplex also done -- Final Interpretationo-- Left: Normal examination. No evidence of arterial occlusive disease.o LT Great toe pressure = 82 mmHg. No significant change asoocompared to previous study.o 02/23/2015 -- he has not Been able to see Dr. Sampson Goon and is running out of her Augmentin so we shall extend this. She had a stomach bug yesterday and could not make it to HBO. Continues to smoke but is working on quitting. Electronic Signature(s) Signed: 02/23/2015 11:55:27 AM By: [name] MD, FACS Entered By: [name] on 02/23/2015 11:55:26 Burleson, [name] ([id number]) -------------------------------------------------------------------------------- Physical Exam Details Patient Name: Champagne, Sennie M. Date of Service: 02/23/2015 10:15 AM Medical Record Number: [id number] Patient Account Number: [id number] Date of Birth/Sex: [birth year] (39 y.o. Female) Treating RN: Primary Care Physician: [name] Other Clinician: Referring Physician: [name] Treating Physician/Extender: [name] in Treatment: 12 Constitutional . Pulse regular. Respirations normal and unlabored. Afebrile. . Eyes Nonicteric. Reactive to light. Ears, Nose, Mouth, and Throat Lips, teeth, and gums WNL.Marland Kitchen Moist mucosa without lesions . Neck supple and nontender. No palpable supraclavicular or cervical adenopathy. Normal sized without goiter. Respiratory WNL. No retractions.. Cardiovascular Pedal Pulses WNL. No clubbing, cyanosis or edema. Lymphatic No adneopathy. No adenopathy. No adenopathy. Musculoskeletal Adexa  without tenderness or enlargement.. Digits and nails w/o clubbing, cyanosis, infection, petechiae, ischemia, or inflammatory conditions.. Integumentary (Hair, Skin) No suspicious lesions. No crepitus or fluctuance. No peri-wound warmth or erythema. No masses.Marland Kitchen Psychiatric Judgement and insight Intact.. No evidence of depression, anxiety, or agitation.. Notes The cellulitis on the dorsum of the left foot has gone down and the ulcerated area has some debris which needs sharp debridement with a forcep and scissors. The wound is still quite deep. Electronic Signature(s) Signed: 02/23/2015 11:56:15 AM By: [name] MD, FACS Entered By: [name] on 02/23/2015 11:56:15 Schiavi, [name] ([id number]) -------------------------------------------------------------------------------- Physician Orders Details Patient Name: Huante, Emagene M. Date of Service: 02/23/2015 10:15 AM Medical Record Number: [id number] Patient Account Number: [id number] Date of Birth/Sex: 03-11-1976 (39 y.o. Female) Treating RN: Huel Coventry Primary Care Physician: [name] Other Clinician: Referring Physician: [name] Treating Physician/Extender: [name] in Treatment: 12 Verbal / Phone Orders: Yes Clinician: Huel Coventry Read Back and Verified: Yes Diagnosis Coding Wound Cleansing Wound #1 Left,Lateral,Plantar Foot o Clean wound with Normal Saline. Anesthetic Wound #1 Left,Lateral,Plantar Foot o Topical Lidocaine 4% cream applied to wound bed prior to debridement Primary Wound Dressing Wound #1 Left,Lateral,Plantar Foot o Iodoform packing Gauze Secondary Dressing Wound #1 Left,Lateral,Plantar Foot o Boardered Foam Dressing Dressing Change Frequency Wound #1 Left,Lateral,Plantar Foot o Change dressing every other day. Follow-up Appointments Wound #1 Left,Lateral,Plantar Foot o Return Appointment in 1 week. Off-Loading Wound #1 Left,Lateral,Plantar Foot o Open toe  surgical shoe to: Additional Orders / Instructions Wound #1 Left,Lateral,Plantar Foot o Stop Smoking Hyperbaric Oxygen Therapy Wound #1 Left,Lateral,Plantar Foot Pankonin, Bitania M. ([id number]) o Evaluate for HBO Therapy o Indication: - Osteomyelitis; Abscess o 2.0 ATA for 90 Minutes without Air Breaks o One treatment per day (delivered Monday through Friday unless otherwise specified in Special Instructions below): o Total # of Treatments: - 30-40 o Follow Hyperbaric Oxygen Glycemia Protocol Medications-please add to medication list. Wound #1 Left,Lateral,Plantar Foot o P.O. Antibiotics - continue Augmentin Patient  Medications Allergies: No Known Allergies Notifications Medication Indication Start End Augmentin 02/24/2015 DOSE 1 - oral 875 mg-125 mg tablet - 1 tablet oral GLYCEMIA INTERVENTIONS PROTOCOL PRE-HBO GLYCEMIA INTERVENTIONS ACTION INTERVENTION Obtain pre-HBO capillary blood 1 glucose (ensure physician order is in chart). A. Notify HBO physician and await physician orders. 2 If result is 70 mg/dl or below: B. If the result meets the hospital definition of a critical result, follow hospital policy. A. Give patient an 8 ounce Glucerna Shake, an 8 ounce Ensure, or 8 ounces of a Glucerna/Ensure equivalent dietary supplement*. B. Wait 30 minutes. If result is 71 mg/dl to 161 mg/dl: C. Retest patientos capillary blood glucose (CBG). D. If result greater than or equal to 110 mg/dl, proceed with HBO. If result less than 110 mg/dl, notify HBO physician and consider holding HBO. If result is 131 mg/dl to 096 mg/dl: A. Proceed with HBO. If result is 250 mg/dl or greater: A. Notify HBO physician and await physician orders. Sivley, Eshani M. ([id number]) B. It is recommended to hold HBO and do blood/urine ketone testing. C. If the result meets the hospital definition of a critical result, follow hospital policy. POST-HBO GLYCEMIA  INTERVENTIONS ACTION INTERVENTION Obtain post HBO capillary blood 1 glucose (ensure physician order is in chart). A. Notify HBO physician and await physician orders. 2 If result is 70 mg/dl or below: B. If the result meets the hospital definition of a critical result, follow hospital policy. A. Give patient an 8 ounce Glucerna Shake, an 8 ounce Ensure, or 8 ounces of a Glucerna/Ensure equivalent dietary supplement*. B. Wait 15 minutes for symptoms of hypoglycemia (i.e. nervousness, anxiety, If result is 71 mg/dl to 914 mg/dl: sweating, chills, clamminess, irritability, confusion, tachycardia or dizziness). C. If patient asymptomatic, discharge patient. If patient symptomatic, repeat capillary blood glucose (CBG) and notify HBO physician. If result is 101 mg/dl to 782 mg/dl: A. Discharge patient. A. Notify HBO physician and await physician orders. B. It is recommended to do If result is 250 mg/dl or greater: blood/urine ketone testing. C. If the result meets the hospital definition of a critical result, follow hospital policy. *Juice or candies are NOT equivalent products. If patient refuses the Glucerna or Ensure, please consult the hospital dietitian for an appropriate substitute. Electronic Signature(s) Signed: 02/23/2015 2:38:09 PM By: [name] MD, FACS Entered By: [name] on 02/23/2015 14:38:09 Cala, [name] ([id number]) -------------------------------------------------------------------------------- Problem List Details Patient Name: Carrera, Maralyn M. Date of Service: 02/23/2015 10:15 AM Medical Record Number: [id number] Patient Account Number: [id number] Date of Birth/Sex: March 14, 1976 (39 y.o. 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 F17.218 Nicotine dependence, cigarettes, with other nicotine- 11/30/2014 Yes induced disorders E66.01 Morbid (severe) obesity due to excess calories 11/30/2014 Yes L03.116 Cellulitis of left lower limb 02/02/2015 Yes 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 Date of Service: 02/23/2015 10:15 AM Medical Record Number: [id number] Patient Account Number: [id number] Date of  Birth/Sex: [birth year] (39 y.o. Female) Treating RN: Primary Care Physician: [name] Other Clinician: Referring Physician: [name] Treating Physician/Extender: [name] in Treatment: 12 Subjective Chief Complaint Information obtained from Patient Patients presents for treatment of an open diabetic ulcer. The patient has had an ulcerated wound on her left lower extremity for about 2 years. History of Present Illness (HPI) The following HPI elements were documented for the patient's wound: Location: left foot ulcer Quality: Patient reports No Pain. Severity: Wound is moderately severed with some exposed structure in the wound bed. Duration: Patient has had the wound for > 2 years prior to seeking treatment at the wound center Timing: for the last 2 years she has been treated at [facility] Context: The wound appeared gradually over  time Modifying Factors: Other treatment(s) tried include:in the last month she has been operated on the left foot for an abscess of that foot and was in hospital for 2 weeks. Associated Signs and Symptoms: Periwound excoriated and wound draining heavily 39 year old patient who is known to be a diabetic comes from the office of Dr. Dario Guardian for a ulcer on the left lower extremity and foot. She has had an ulcer on the left foot and has had previous treatment at [facility]. the patient tells [place] that she has been seen as the outpatient center for a long while and has had various tests including MRIs done but she is not a very good historian. Most recently about a month ago she had a abscess drained from her left foot and was in hospital for about 2 weeks and was then discharged with a PICC line and IV antibioticstics which she knows may be vancomycin and Levaquin. She has never had a wound VAC now has she had hyperbaric oxygen therapy. Past medical history significant for dyspnea, hypertension, hyperlipidemia, neuropathy, diabetes type 2 uncontrolled. She smokes cigarettes the latest blood reports are from March 2016 and show that her BMP is essentially normal her lipid panel was also normal her hemoglobin A1c was 9.4. 12/07/2014 -- Her tissue culture is back and she has grown significant amounts of Proteus mirabilis, pseudomonas aeruginosa and Staphylococcus aureus. The staph is a MSSA and all the cultures are sensitive to ciprofloxacin. I will start her on Cipro 500 mg by mouth twice a day for 14 days. Xray left foot -- IMPRESSION:No evidence of osteomyelitis. Healed fracture of the distal left third metatarsal. Aure, [name] ([id number]) 12/21/2014 -- she is still awaiting clearance from her insurance company for HBOT. She was seen by Dr. Dario Guardian was working on controlling her diabetes and she continues to work on giving up smoking. She has completed her dosage of ciprofloxacin and we will be  continuing with local care. 01/03/2015 -- her insurance clearance is still not in and we are awaiting this before starting her on a hyperbaric oxygen therapy. 01/09/2015 - we are still awaiting her insurance clearance and other than that she is doing fine. 01/26/2015 -- she was unable to tolerate the total contact cast and hence had a Darco shoe back on her left foot. Her blood sugars have been doing better and her last hemoglobin A1c report is pending. He continues to smoke 6-8 cigarettes a day. 02/02/2015 -- yesterday her left leg started getting swollen and has red streaks starting at the foot and going up to her knee. She has had a mild fever and has not gone to see her PCP or [facility] where she normally gets  her treatment for this. 02/15/2015 - she has recently been discharged from [facility] after she was admitted for a cellulitis abscess and osteomyelitis of her left foot. We haven't received all her reports yet but as per the patient she was on IV antibiotics for 10 days and then discharged home on Augmentin. Addendum -- was able to look at her records in Epic. Hospital Course: KAILIA STARRY is a 39 y.o. female with PMH of HTN, DM, depression, and tobacco abuse who presented with left diabetic foot ulcer and cellulitis. Left leg cellulitis/Left diabetic foot ulcer with osteomyelitis: The patient presented with worsening pain and green pus drainage from her diabetic foot ulcer and surrounding cellulitis. Most recently, she was hospitalized 4/12-4/19 for this foot wound. She was treated with Vancomycin and Zosyn. ID was consulted and recommended that the patient receive Daptomycin, Ceftazidime, and Flagyl x 2 weeks. She had a PICC line placed prior to discharge. After discharge, she was transitioned to oral antibiotics. Wound cultures have grown GAS, GBS, MSSA, and MRSA.Marland Kitchen She notes that she was last treated with Ciprofloxacin for about 2 weeks and completed this course at  least 2 weeks ago. She continues to follow with her wound clinic in [place] weekly. - Obtained MRI LLE which showed osteomyelitis of the left fourth metatarsal head. IMPRESSION: Findings compatible with acute osteomyelitis of the left fourth metatarsal head, with question of nondisplaced pathologic fracture. Plantar ulcer underlying the fourth MTP joint and diffuse soft tissue edema are noted. - Consulted Vascular Surgery, appreciate assistance. Performed left foot ulcer/bone debridement 8/15, sent bone for culture which showed no growth to date. - Started IV Vancomycin, Ceftaz, and Flagyl on admission. Transitioned to IV Unasyn 8/15. Given negative bone cultures, will transition to oral Augmentin for 2 week course at discharge. - Provided wound care w/ wet to dry packing to left foot wound with Kerlix wrap, change daily. - Per Vascular Surgery, WBAT LLE with offloading shoe. - Provided pain control with Oxycodone PRN, although pain had improved considerably prior to discharge. - Plan for follow up with Wound Clinic in [place] within 1 week after discharge. Arterial Duplex also done -- Final Interpretation -- Left: Normal examination. No evidence of arterial occlusive disease. LT Great toe pressure = 82 mmHg. No significant change as compared to previous study. 02/23/2015 -- he has not Been able to see Dr. Sampson Goon and is running out of her Augmentin so we shall extend this. She had a stomach bug yesterday and could not make it to HBO. Continues to smoke but is Blevins, NOVELLE ADDAIR. ([id number]) working on quitting. Objective Constitutional Pulse regular. Respirations normal and unlabored. Afebrile. Vitals Time Taken: 11:01 AM, Height: 67 in, Weight: 290 lbs, BMI: 45.4, Temperature: 95.8 F, Pulse: 90 bpm, Respiratory Rate: 18 breaths/min, Blood Pressure: 128/72 mmHg, Capillary Blood Glucose: 258 mg/dl. Eyes Nonicteric. Reactive to light. Ears, Nose, Mouth, and Throat Lips, teeth,  and gums WNL.Marland Kitchen Moist mucosa without lesions . Neck supple and nontender. No palpable supraclavicular or cervical adenopathy. Normal sized without goiter. Respiratory WNL. No retractions.. Cardiovascular Pedal Pulses WNL. No clubbing, cyanosis or edema. Lymphatic No adneopathy. No adenopathy. No adenopathy. Musculoskeletal Adexa without tenderness or enlargement.. Digits and nails w/o clubbing, cyanosis, infection, petechiae, ischemia, or inflammatory conditions.Marland Kitchen Psychiatric Judgement and insight Intact.. No evidence of depression, anxiety, or agitation.. General Notes: The cellulitis on the dorsum of the left foot has gone down and the ulcerated area has some debris which needs sharp debridement with a forcep  and scissors. The wound is still quite deep. Integumentary (Hair, Skin) No suspicious lesions. No crepitus or fluctuance. No peri-wound warmth or erythema. No masses.. Wound #1 status is Open. Original cause of wound was Gradually Appeared. The wound is located on the Janik, Lailyn M. ([id number]) Left,Lateral,Plantar Foot. The wound measures 1cm length x 0.5cm width x 1.5cm depth; 0.393cm^2 area and 0.589cm^3 volume. The wound is limited to skin breakdown. There is a large amount of serosanguineous drainage noted. The wound margin is thickened. There is medium (34-66%) pink granulation within the wound bed. There is a small (1-33%) amount of necrotic tissue within the wound bed including Adherent Slough. The periwound skin appearance exhibited: Localized Edema, Moist, Erythema. The periwound skin appearance did not exhibit: Callus, Crepitus, Excoriation, Fluctuance, Friable, Induration, Rash, Scarring, Dry/Scaly, Maceration, Atrophie Blanche, Cyanosis, Ecchymosis, Hemosiderin Staining, Mottled, Pallor, Rubor. The surrounding wound skin color is noted with erythema with red streaks. Periwound temperature was noted as No Abnormality. 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 I have extended her course of Augmentin for another 10 days.She has also been put on some Nasal Decongestant so as to help her be ready for her HBO next week. I have again motivated her to quit smoking and also be very compliant with her HBO treatments. He is urged to follow-up with Dr. Sampson Goon. Procedures Wound #1 Wound #1 is a Diabetic Wound/Ulcer of the Lower Extremity located on the Left,Lateral,Plantar Foot . There was a Skin/Subcutaneous Tissue Debridement ([id number]) debridement with total area of 0.5 sq cm performed by Chaelyn Bunyan, Ignacia Felling., MD. with the following instrument(s): Forceps and Scissors to remove Viable and Non-Viable tissue/material including Exudate, Fibrin/Slough, Eschar, Skin, and Subcutaneous after achieving pain control using Other (lidoxaine 4%). A time out was conducted prior to the start of the procedure. A Minimum amount of bleeding was controlled with Pressure. The procedure was tolerated well with a pain level of 0 throughout and a pain level of 0 following the procedure. Post Debridement Measurements: 1cm length x 0.7cm width x 1.5cm depth; 0.825cm^3 volume. Herst, [name] ([id number]) Post procedure Diagnosis Wound #1: Same as Pre-Procedure Plan Wound Cleansing: Wound #1 Left,Lateral,Plantar Foot: Clean wound with Normal Saline. Anesthetic: Wound #1 Left,Lateral,Plantar Foot: Topical Lidocaine 4%  cream applied to wound bed prior to debridement Primary Wound Dressing: Wound #1 Left,Lateral,Plantar Foot: Iodoform packing Gauze Secondary Dressing: Wound #1 Left,Lateral,Plantar Foot: Boardered Foam Dressing Dressing Change Frequency: Wound #1 Left,Lateral,Plantar Foot: Change dressing every other day. Follow-up Appointments: Wound #1 Left,Lateral,Plantar Foot: Return Appointment in 1 week. Off-Loading: Wound #1 Left,Lateral,Plantar Foot: Open toe surgical shoe to: Additional Orders / Instructions: Wound #1 Left,Lateral,Plantar Foot: Stop Smoking Hyperbaric Oxygen Therapy: Wound #1 Left,Lateral,Plantar Foot: Evaluate for HBO Therapy Indication: - Osteomyelitis; Abscess 2.0 ATA for 90 Minutes without Air Breaks One treatment per day (delivered Monday through Friday unless otherwise specified in Special Instructions below): Total # of Treatments: - 30-40 Follow Hyperbaric Oxygen Glycemia Protocol Medications-please add to medication list.: Wound #1 Left,Lateral,Plantar Foot: P.O. Antibiotics -  continue Augmentin The following medication(s) was prescribed: Augmentin oral 875 mg-125 mg tablet 1 1 tablet oral starting 02/24/2015 Abernethy, Martie M. ([id number]) We will continue to pack with half-inch iodoform cause and the dressing will be changed daily. I have extended her course of Augmentin for another 10 days.She has also been put on some Nasal Decongestant so as to help her be ready for her HBO next week. I have again motivated her to quit smoking and also be very compliant with her HBO treatments. He is urged to follow-up with Dr. Sampson Goon. Electronic Signature(s) Signed: 02/23/2015 4:22:40 PM By: [name] MD, FACS Previous Signature: 02/23/2015 11:57:40 AM Version By: [name] MD, FACS Entered By: [name] on 02/23/2015 16:22:39 Rollins, [name] ([id number]) -------------------------------------------------------------------------------- SuperBill  Details Patient Name: Jaffee, Martisha M. Date of Service: 02/23/2015 Medical Record Number: [id number] Patient Account Number: [id number] Date of Birth/Sex: 03-18-1976 (39 y.o. Female) Treating RN: Primary Care Physician: [name] Other Clinician: Referring Physician: [name] Treating Physician/Extender: [name] in Treatment: 12 Diagnosis Coding ICD-10 Codes Code Description E11.621 Type 2 diabetes mellitus with foot ulcer E08.40 Diabetes mellitus due to underlying condition with diabetic neuropathy, unspecified L97.513 Non-pressure chronic ulcer of other part of right foot with necrosis of muscle F17.218 Nicotine dependence, cigarettes, with other nicotine-induced disorders E66.01 Morbid (severe) obesity due to excess calories L03.116 Cellulitis of left lower limb 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, left ankle and foot Facility Procedures CPT4: Description Modifier Quantity Code 08657846 11042 - DEB SUBQ TISSUE 20 SQ CM/< 1 ICD-10 Description Diagnosis E11.621 Type 2 diabetes mellitus with foot ulcer E08.40 Diabetes mellitus due to underlying condition with diabetic neuropathy,  unspecified 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, 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

## 2015-02-27 ENCOUNTER — Encounter: Payer: Medicaid Other | Admitting: Surgery

## 2015-02-27 DIAGNOSIS — L97513 Non-pressure chronic ulcer of other part of right foot with necrosis of muscle: Secondary | ICD-10-CM | POA: Diagnosis not present

## 2015-02-27 LAB — GLUCOSE, CAPILLARY
Glucose-Capillary: 277 mg/dL — ABNORMAL HIGH (ref 65–99)
Glucose-Capillary: 176 mg/dL — ABNORMAL HIGH (ref 65–99)

## 2015-02-27 NOTE — Progress Notes (Signed)
[name], [name] ([id number]) Visit Report for 02/27/2015 Arrival Information Details Patient Name: [name], [name] Date of Service: 02/27/2015 10:00 AM Medical Record Number: [id number] Patient Account Number: [id number] Date of Birth/Sex: 05/15/1976 (39 y.o. Female) Treating RN: Primary Care Physician: [name] Other Clinician: [name] Referring Physician: [name] Treating Physician/Extender: [name] in Treatment: 12 Visit Information History Since Last Visit Added or deleted any medications: No Patient Arrived: Ambulatory Any new allergies or adverse reactions: No Arrival Time: 09:50 Had a fall or experienced change in No Accompanied By: self activities of daily living that may affect Transfer Assistance: None risk of falls: Patient Identification Verified: Yes Signs or symptoms of abuse/neglect No Secondary Verification Process Yes since last visito Completed: Hospitalized since last visit: No Patient Has Alerts: Yes Has Dressing in Place as Prescribed: Yes Patient Alerts: Type II Diabetic Has Footwear/Offloading in Place as Yes 11/30/2014 ABI: Prescribed: (L) 1.24 (R) Left: Surgical Shoe with 0.92 Pressure Relief Insole Right: Surgical Shoe with Pressure Relief Insole Pain Present Now: No Electronic Signature(s) Signed: 02/27/2015 4:14:41 PM By: Dayton Martes RCP, RRT, CHT Entered By: Dayton Martes on 02/27/2015 10:08:44 Dicicco, [name] ([id number]) -------------------------------------------------------------------------------- Encounter Discharge Information Details Patient Name: [name], [name] Date of Service: 02/27/2015 10:00 AM Medical Record Number: [id number] Patient Account Number: [id number] Date of Birth/Sex: Jul 16, 1975 (39 y.o. Female) Treating RN: Primary Care Physician: [name] Other Clinician: [name] Referring Physician: [name] Treating  Physician/Extender: [name] in Treatment: 12 Encounter Discharge Information Items Discharge Pain Level: 0 Discharge Condition: Stable Ambulatory Status: Ambulatory Discharge Destination: Home Private Transportation: Auto Accompanied By: self Schedule Follow-up Appointment: No Medication Reconciliation completed and No provided to Patient/Care Chelcey Caputo: Clinical Summary of Care: Notes Patient has an HBO treatment scheduled on 02/28/15 at 10:00 am. Electronic Signature(s) Signed: 02/27/2015 4:14:41 PM By: Dayton Martes RCP, RRT, CHT Entered By: Dayton Martes on 02/27/2015 12:22:00 Guidotti, [name] ([id number]) -------------------------------------------------------------------------------- Vitals Details Patient Name: [name], [name] Date of Service: 02/27/2015 10:00 AM Medical Record Number: [id number] Patient Account Number: [id number] Date of Birth/Sex: 05/25/1976 (39 y.o. Female) Treating RN: Primary Care Physician: [name] Other Clinician: [name] Referring Physician: [name] Treating Physician/Extender: [name] in Treatment: 12 Vital Signs Time Taken: 09:54 Temperature (F): 98.2 Height (in): 67 Pulse (bpm): 96 Weight (lbs): 290 Respiratory Rate (breaths/min): 18 Body Mass Index (BMI): 45.4 Blood Pressure (mmHg): 138/84 Capillary Blood Glucose (mg/dl): 742 Reference Range: 80 - 120 mg / dl Electronic Signature(s) Signed: 02/27/2015 4:14:41 PM By: Dayton Martes RCP, RRT, CHT Entered By: Dayton Martes on 02/27/2015 10:09:31

## 2015-02-27 NOTE — Progress Notes (Signed)
TOIA, MICALE ([id number]) Visit Report for 02/27/2015 HBO Details Patient Name: Oneil, MINHA FULCO. Date of Service: 02/27/2015 10:00 AM Medical Record Number: [id number] Patient Account Number: [id number] Date of Birth/Sex: 1975-09-25 (39 y.o. Female) Treating RN: Primary Care Physician: [name] Other Clinician: [name] Referring Physician: [name] Treating Physician/Extender: [name] in Treatment: 12 HBO Treatment Course Details Treatment Course Ordering Physician: [name] 1 Number: HBO Treatment Start Date: 02/19/2015 Total Treatments 30 Ordered: HBO Indication: Diabetic Ulcer(s) of the Lower Extremity HBO Treatment Details Treatment Number: 5 Patient Type: Outpatient Chamber Type: Monoplace Chamber #: [id number] Treatment Protocol: 2.0 ATA with 90 minutes oxygen, and no air breaks Treatment Details Compression Rate Down: 1.5 psi / minute De-Compression Rate Up: 1.5 psi / minute Air breaks and breathing Compress Tx Pressure Decompress Decompress periods Begins Reached Begins Ends (leave unused spaces blank) Chamber Pressure 1 ATA 2.0 ATA - - - - - - 2.0 ATA 1 ATA Clock Time (24 hr) 10:04 10:16 - - - - - - 11:46 11:58 Treatment Length: 114 (minutes) Treatment Segments: 4 Capillary Blood Glucose Pre Capillary Blood Glucose (mg/dl): Post Capillary Blood Glucose (mg/dl): Vital Signs Capillary Blood Glucose Reference Range: 80 - 120 mg / dl HBO Diabetic Blood Glucose Intervention Range: <131 mg/dl or >865 mg/dl Time Vitals Blood Respiratory Capillary Blood Glucose Pulse Action Type: Pulse: Temperature: Taken: Pressure: Rate: Glucose (mg/dl): Meter #: Oximetry (%) Taken: Pre 09:54 138/84 96 18 98.2 277 1 none Post 12:02 136/90 84 18 97.7 176 1 none Pre-Treatment Ear Evaluation Left Right Clear: Yes Clear: Yes Mash, Aseel M. ([id number]) Left Teed Scale: Grade 0 Right Teed Scale: Grade 0 Treatment  Response Treatment Completion Status: Treatment Completed without Adverse Event HBO Attestation I certify that I supervised this HBO treatment in accordance with Medicare guidelines. A trained Yes emergency response team is readily available per hospital policies and procedures. Continue HBOT as ordered. Yes Electronic Signature(s) Signed: 02/27/2015 3:01:20 PM By: [name] MD, FACS Entered By: [name] on 02/27/2015 15:01:20 Sigel, [name] ([id number]) -------------------------------------------------------------------------------- HBO Safety Checklist Details Patient Name: Coppolino, Antara M. Date of Service: 02/27/2015 10:00 AM Medical Record Number: [id number] Patient Account Number: [id number] Date of Birth/Sex: [birth year] (39 y.o. Female) Treating RN: Primary Care Physician: [name] Other Clinician: [name] Referring Physician: [name] Treating Physician/Extender: [name] in Treatment: 12 HBO Safety Checklist Items Safety Checklist Consent Form Signed Patient voided / foley secured and emptied When did you last eato 20:00 pm on 02/26/15 Last dose of injectable or oral agent 08:00 am NA Ostomy pouch emptied and vented if applicable NA All implantable devices assessed, documented and approved NA Intravenous access site secured and place Valuables secured Linens and cotton and cotton/polyester blend (less than 51% polyester) Personal oil-based products / skin lotions / body lotions removed Wigs or hairpieces removed Smoking or tobacco materials removed Books / newspapers / magazines / loose paper removed Cologne, aftershave, perfume and deodorant removed Jewelry removed (may wrap wedding band) Make-up removed Hair care products removed Battery operated devices (external) removed Heating patches and chemical warmers removed NA Titanium eyewear removed Nail polish cured greater than 10 hours NA Casting material cured greater  than 10 hours NA Hearing aids removed NA Loose dentures or partials removed NA Prosthetics have been removed Patient demonstrates correct use of air break device (if applicable) Patient concerns have been addressed Patient grounding bracelet on and cord attached to chamber Specifics for Inpatients (complete in addition to above)  Medication sheet sent with patient Intravenous medications needed or due during therapy sent with patient Hodges, MAZEY MANTELL. ([id number]) Drainage tubes (e.g. nasogastric tube or chest tube secured and vented) Endotracheal or Tracheotomy tube secured Cuff deflated of air and inflated with saline Airway suctioned Electronic Signature(s) Signed: 02/27/2015 4:14:41 PM By: Dayton Martes RCP, RRT, CHT Entered By: Dayton Martes on 02/27/2015 10:10:54

## 2015-02-28 ENCOUNTER — Encounter: Payer: Medicaid Other | Admitting: Surgery

## 2015-02-28 DIAGNOSIS — L97513 Non-pressure chronic ulcer of other part of right foot with necrosis of muscle: Secondary | ICD-10-CM | POA: Diagnosis not present

## 2015-02-28 LAB — GLUCOSE, CAPILLARY
Glucose-Capillary: 297 mg/dL — ABNORMAL HIGH (ref 65–99)
Glucose-Capillary: 226 mg/dL — ABNORMAL HIGH (ref 65–99)

## 2015-03-01 ENCOUNTER — Encounter: Payer: Medicaid Other | Admitting: Surgery

## 2015-03-01 DIAGNOSIS — L97513 Non-pressure chronic ulcer of other part of right foot with necrosis of muscle: Secondary | ICD-10-CM | POA: Diagnosis not present

## 2015-03-01 LAB — GLUCOSE, CAPILLARY
GLUCOSE-CAPILLARY: 262 mg/dL — AB (ref 65–99)
GLUCOSE-CAPILLARY: 224 mg/dL — AB (ref 65–99)

## 2015-03-01 NOTE — Progress Notes (Signed)
MEEYAH, OVITT ([id number]) Visit Report for 02/28/2015 Arrival Information Details Patient Name: [name], [name] Date of Service: 02/28/2015 10:00 AM Medical Record Patient Account Number: [id number] Number: Treating RN: 1975-08-07 (39 y.o. Other Clinician: [name] Date of Birth/Sex: Female) Treating BURNS III, Primary Care Physician: [name] Physician/Extender: Zollie Beckers Referring Physician: [name] Weeks in Treatment: 12 Visit Information History Since Last Visit Added or deleted any medications: No Patient Arrived: Ambulatory Any new allergies or adverse reactions: No Arrival Time: 09:25 Had a fall or experienced change in No Accompanied By: self activities of daily living that may affect Transfer Assistance: None risk of falls: Patient Identification Verified: Yes Signs or symptoms of abuse/neglect No Secondary Verification Process Yes since last visito Completed: Hospitalized since last visit: No Patient Has Alerts: Yes Has Dressing in Place as Prescribed: Yes Patient Alerts: Type II Diabetic Has Footwear/Offloading in Place as Yes 11/30/2014 ABI: Prescribed: (L) 1.24 (R) Left: Surgical Shoe with 0.92 Pressure Relief Insole Right: Surgical Shoe with Pressure Relief Insole Pain Present Now: No Electronic Signature(s) Signed: 02/28/2015 4:06:40 PM By: Dayton Martes RCP, RRT, CHT Entered By: Dayton Martes on 02/28/2015 10:08:19 [name], [name] ([id number]) -------------------------------------------------------------------------------- Encounter Discharge Information Details Patient Name: [name], [name] Date of Service: 02/28/2015 10:00 AM Medical Record Patient Account Number: [id number] Number: Treating RN: Mar 21, 1976 (39 y.o. Other Clinician: [name] Date of Birth/Sex: Female) Treating BURNS III, Primary Care Physician: [name] Physician/Extender:  Zollie Beckers Referring Physician: [name] Weeks in Treatment: 12 Encounter Discharge Information Items Discharge Pain Level: 0 Discharge Condition: Stable Ambulatory Status: Ambulatory Discharge Destination: Home Private Transportation: Auto Accompanied By: self Schedule Follow-up Appointment: No Medication Reconciliation completed and No provided to Patient/Care Edmon Magid: Clinical Summary of Care: Notes Patient has an HBO treatment scheduled on 03/01/15 at 10:00 am. Electronic Signature(s) Signed: 02/28/2015 4:06:40 PM By: Dayton Martes RCP, RRT, CHT Entered By: Dayton Martes on 02/28/2015 12:22:57 [name], [name] ([id number]) -------------------------------------------------------------------------------- Vitals Details Patient Name: [name], [name] Date of Service: 02/28/2015 10:00 AM Medical Record Patient Account Number: [id number] Number: Treating RN: Aug 15, 1975 (39 y.o. Other Clinician: [name] Date of Birth/Sex: Female) Treating BURNS III, Primary Care Physician: [name] Physician/Extender: Zollie Beckers Referring Physician: [name] Weeks in Treatment: 12 Vital Signs Time Taken: 09:29 Temperature (F): 98.3 Height (in): 67 Pulse (bpm): 72 Weight (lbs): 290 Respiratory Rate (breaths/min): 18 Body Mass Index (BMI): 45.4 Blood Pressure (mmHg): 154/96 Capillary Blood Glucose (mg/dl): 010 Reference Range: 80 - 120 mg / dl Electronic Signature(s) Signed: 02/28/2015 4:06:40 PM By: Dayton Martes RCP, RRT, CHT Entered By: Dayton Martes on 02/28/2015 10:09:17

## 2015-03-01 NOTE — Progress Notes (Signed)
BETHANY, HIRT ([id number]) Visit Report for 02/28/2015 HBO Details Patient Name: [name], [name] Date of Service: 02/28/2015 10:00 AM Medical Record Patient Account Number: [id number] Number: Treating RN: 23-Sep-1975 (39 y.o. Other Clinician: [name] Date of Birth/Sex: Female) Treating BURNS III, Primary Care Physician: [name] Physician/Extender: Zollie Beckers Referring Physician: [name] Weeks in Treatment: 12 HBO Treatment Course Details Treatment Course Ordering Physician: [name] 1 Number: HBO Treatment Start Date: 02/19/2015 Total Treatments 30 Ordered: HBO Indication: Diabetic Ulcer(s) of the Lower Extremity HBO Treatment Details Treatment Number: 6 Patient Type: Outpatient Chamber Type: Monoplace Chamber #: [id number] Treatment Protocol: 2.0 ATA with 90 minutes oxygen, and no air breaks Treatment Details Compression Rate Down: 1.5 psi / minute De-Compression Rate Up: 1.5 psi / minute Air breaks and breathing Compress Tx Pressure Decompress Decompress periods Begins Reached Begins Ends (leave unused spaces blank) Chamber Pressure 1 ATA 2.0 ATA - - - - - - 2.0 ATA 1 ATA Clock Time (24 hr) 09:42 09:53 - - - - - - 11:23 11:34 Treatment Length: 112 (minutes) Treatment Segments: 4 Capillary Blood Glucose Pre Capillary Blood Glucose (mg/dl): Post Capillary Blood Glucose (mg/dl): Vital Signs Capillary Blood Glucose Reference Range: 80 - 120 mg / dl HBO Diabetic Blood Glucose Intervention Range: <131 mg/dl or >562 mg/dl Time Vitals Blood Respiratory Capillary Blood Glucose Pulse Action Type: Pulse: Temperature: Taken: Pressure: Rate: Glucose (mg/dl): Meter #: Oximetry (%) Taken: Pre 09:29 154/96 72 18 98.3 297 1 none Post 11:39 122/94 84 18 97.6 226 1 none Treatment Response MIKEYLA, MUSIC ([id number]) Treatment Completion Status: Treatment Completed without Adverse Event Electronic Signature(s) Signed: 02/28/2015  4:02:17 PM By: Madelaine Bhat MD Signed: 02/28/2015 4:06:40 PM By: Dayton Martes RCP, RRT, CHT Entered By: Dayton Martes on 02/28/2015 12:22:09 [name], [name] ([id number]) -------------------------------------------------------------------------------- HBO Safety Checklist Details Patient Name: [name], [name] Date of Service: 02/28/2015 10:00 AM Medical Record Patient Account Number: [id number] Number: Treating RN: 17-Jan-1976 (39 y.o. Other Clinician: [name] Date of Birth/Sex: Female) Treating BURNS III, Primary Care Physician: [name] Physician/Extender: Zollie Beckers Referring Physician: [name] Weeks in Treatment: 12 HBO Safety Checklist Items Safety Checklist Consent Form Signed Patient voided / foley secured and emptied When did you last eato 13:00 pm on 02/27/15 Last dose of injectable or oral agent 21:00 pm on 02/27/15 NA Ostomy pouch emptied and vented if applicable NA All implantable devices assessed, documented and approved NA Intravenous access site secured and place Valuables secured Linens and cotton and cotton/polyester blend (less than 51% polyester) Personal oil-based products / skin lotions / body lotions removed Wigs or hairpieces removed Smoking or tobacco materials removed Books / newspapers / magazines / loose paper removed Cologne, aftershave, perfume and deodorant removed Jewelry removed (may wrap wedding band) Make-up removed Hair care products removed Battery operated devices (external) removed Heating patches and chemical warmers removed NA Titanium eyewear removed Nail polish cured greater than 10 hours NA Casting material cured greater than 10 hours NA Hearing aids removed NA Loose dentures or partials removed NA Prosthetics have been removed Patient demonstrates correct use of air break device (if applicable) Patient concerns have been addressed Patient grounding bracelet on and  cord attached to chamber Specifics for Inpatients (complete in addition to above) Medication sheet sent with patient [name], NAM. ([id number]) Intravenous medications needed or due during therapy sent with patient Drainage tubes (e.g. nasogastric tube or chest tube secured and vented) Endotracheal or Tracheotomy tube secured Cuff deflated  of air and inflated with saline Airway suctioned Electronic Signature(s) Signed: 02/28/2015 4:06:40 PM By: Dayton Martes RCP, RRT, CHT Entered By: Weyman Rodney, Lucio Edward on 02/28/2015 10:10:25

## 2015-03-02 ENCOUNTER — Encounter: Payer: Medicaid Other | Admitting: Surgery

## 2015-03-02 DIAGNOSIS — L97513 Non-pressure chronic ulcer of other part of right foot with necrosis of muscle: Secondary | ICD-10-CM | POA: Diagnosis not present

## 2015-03-02 LAB — GLUCOSE, CAPILLARY
GLUCOSE-CAPILLARY: 138 mg/dL — AB (ref 65–99)
Glucose-Capillary: 164 mg/dL — ABNORMAL HIGH (ref 65–99)

## 2015-03-02 NOTE — Progress Notes (Signed)
SHERINA, STAMMER ([id number]) Visit Report for 03/01/2015 HBO Details Patient Name: [name], [name] Date of Service: 03/01/2015 10:00 AM Medical Record Number: [id number] Patient Account Number: [id number] Date of Birth/Sex: [birth year] (39 y.o. Female) Treating RN: Primary Care Physician: [name] Other Clinician: [name] Referring Physician: [name] Treating Physician/Extender: [name] in Treatment: 13 HBO Treatment Course Details Treatment Course Ordering Physician: [name] 1 Number: HBO Treatment Start Date: 02/19/2015 Total Treatments 30 Ordered: HBO Indication: Diabetic Ulcer(s) of the Lower Extremity HBO Treatment Details Treatment Number: 7 Patient Type: Outpatient Chamber Type: Monoplace Chamber #: [id number] Treatment Protocol: 2.0 ATA with 90 minutes oxygen, and no air breaks Treatment Details Compression Rate Down: 1.5 psi / minute De-Compression Rate Up: 1.5 psi / minute Air breaks and breathing Compress Tx Pressure Decompress Decompress periods Begins Reached Begins Ends (leave unused spaces blank) Chamber Pressure 1 ATA 2.0 ATA - - - - - - 2.0 ATA 1 ATA Clock Time (24 hr) 09:50 10:02 - - - - - - 11:32 11:44 Treatment Length: 114 (minutes) Treatment Segments: 4 Capillary Blood Glucose Pre Capillary Blood Glucose (mg/dl): Post Capillary Blood Glucose (mg/dl): Vital Signs Capillary Blood Glucose Reference Range: 80 - 120 mg / dl HBO Diabetic Blood Glucose Intervention Range: <131 mg/dl or >474 mg/dl Time Vitals Blood Respiratory Capillary Blood Glucose Pulse Action Type: Pulse: Temperature: Taken: Pressure: Rate: Glucose (mg/dl): Meter #: Oximetry (%) Taken: Pre 09:40 154/70 96 18 97.8 262 1 none Post 11:56 142/84 72 18 97.5 224 1 none Treatment Response Treatment Completion Status: Treatment Completed without Adverse Event [name], [name] ([id number]) HBO Attestation I certify that I supervised this  HBO treatment in accordance with Medicare guidelines. A trained Yes emergency response team is readily available per hospital policies and procedures. Continue HBOT as ordered. Yes Electronic Signature(s) Signed: 03/01/2015 1:30:04 PM By: [name] MD, FACS Entered By: [name] on 03/01/2015 13:30:04 [name], [name] ([id number]) -------------------------------------------------------------------------------- HBO Safety Checklist Details Patient Name: [name], [name] Date of Service: 03/01/2015 10:00 AM Medical Record Number: [id number] Patient Account Number: [id number] Date of Birth/Sex: [birth year] (39 y.o. Female) Treating RN: Primary Care Physician: [name] Other Clinician: [name] Referring Physician: [name] Treating Physician/Extender: [name] in Treatment: 13 HBO Safety Checklist Items Safety Checklist Consent Form Signed Patient voided / foley secured and emptied When did you last eato 08:30 am Last dose of injectable or oral agent 08:00 am NA Ostomy pouch emptied and vented if applicable NA All implantable devices assessed, documented and approved NA Intravenous access site secured and place Valuables secured Linens and cotton and cotton/polyester blend (less than 51% polyester) Personal oil-based products / skin lotions / body lotions removed Wigs or hairpieces removed Smoking or tobacco materials removed Books / newspapers / magazines / loose paper removed Cologne, aftershave, perfume and deodorant removed Jewelry removed (may wrap wedding band) Make-up removed Hair care products removed Battery operated devices (external) removed Heating patches and chemical warmers removed NA Titanium eyewear removed Nail polish cured greater than 10 hours NA Casting material cured greater than 10 hours NA Hearing aids removed NA Loose dentures or partials removed NA Prosthetics have been removed Patient demonstrates correct  use of air break device (if applicable) Patient concerns have been addressed Patient grounding bracelet on and cord attached to chamber Specifics for Inpatients (complete in addition to above) Medication sheet sent with patient Intravenous medications needed or due during therapy sent with patient [name], BORROR. ([id number]) Drainage tubes (  e.g. nasogastric tube or chest tube secured and vented) Endotracheal or Tracheotomy tube secured Cuff deflated of air and inflated with saline Airway suctioned Electronic Signature(s) Signed: 03/01/2015 3:11:35 PM By: Dayton Martes RCP, RRT, CHT Entered By: Dayton Martes on 03/01/2015 09:54:26

## 2015-03-02 NOTE — Progress Notes (Signed)
BRANDYE, INTHAVONG ([id number]) Visit Report for 03/01/2015 Arrival Information Details Patient Name: [name], [name] Date of Service: 03/01/2015 10:00 AM Medical Record Number: [id number] Patient Account Number: [id number] Date of Birth/Sex: 03/27/1976 (39 y.o. Female) Treating RN: Primary Care Physician: [name] Other Clinician: [name] Referring Physician: [name] Treating Physician/Extender: [name] in Treatment: 13 Visit Information History Since Last Visit Added or deleted any medications: No Patient Arrived: Ambulatory Any new allergies or adverse reactions: No Arrival Time: 09:35 Had a fall or experienced change in No Accompanied By: self activities of daily living that may affect Transfer Assistance: None risk of falls: Patient Identification Verified: Yes Signs or symptoms of abuse/neglect No Secondary Verification Process Yes since last visito Completed: Hospitalized since last visit: No Patient Has Alerts: Yes Has Dressing in Place as Prescribed: Yes Patient Alerts: Type II Diabetic Has Footwear/Offloading in Place as Yes 11/30/2014 ABI: Prescribed: (L) 1.24 (R) Right: Surgical Shoe with 0.92 Pressure Relief Insole Pain Present Now: No Electronic Signature(s) Signed: 03/01/2015 3:11:35 PM By: Dayton Martes RCP, RRT, CHT Entered By: Weyman Rodney, Lucio Edward on 03/01/2015 09:53:12 [name], [name] -------------------------------------------------------------------------------- Encounter Discharge Information Details Patient Name: [name], [name] Date of Service: 03/01/2015 10:00 AM Medical Record Number: [id number] Patient Account Number: [id number] Date of Birth/Sex: 12-23-1975 (39 y.o. Female) Treating RN: Primary Care Physician: [name] Other Clinician: [name] Referring Physician: [name] Treating Physician/Extender: [name] in Treatment:  78 Encounter Discharge Information Items Discharge Pain Level: 0 Discharge Condition: Stable Ambulatory Status: Ambulatory Discharge Destination: Home Private Transportation: Auto Accompanied By: self Schedule Follow-up Appointment: No Medication Reconciliation completed and No provided to Patient/Care Paradise Vensel: Clinical Summary of Care: Notes Patient has an HBO treatment scheduled on 03/02/15 at 10:00 am. Electronic Signature(s) Signed: 03/01/2015 3:11:35 PM By: Dayton Martes RCP, RRT, CHT Entered By: Weyman Rodney, Lucio Edward on 03/01/2015 12:59:11 [name], [name] -------------------------------------------------------------------------------- Vitals Details Patient Name: [name], [name] Date of Service: 03/01/2015 10:00 AM Medical Record Number: [id number] Patient Account Number: [id number] Date of Birth/Sex: 1975/11/02 (39 y.o. Female) Treating RN: Primary Care Physician: [name] Other Clinician: [name] Referring Physician: [name] Treating Physician/Extender: [name] in Treatment: 13 Vital Signs Time Taken: 09:40 Temperature (F): 97.8 Height (in): 67 Pulse (bpm): 96 Weight (lbs): 290 Respiratory Rate (breaths/min): 18 Body Mass Index (BMI): 45.4 Blood Pressure (mmHg): 154/70 Capillary Blood Glucose (mg/dl): 536 Reference Range: 80 - 120 mg / dl Electronic Signature(s) Signed: 03/01/2015 3:11:35 PM By: Dayton Martes RCP, RRT, CHT Entered By: Dayton Martes on 03/01/2015 09:53:38

## 2015-03-03 NOTE — Progress Notes (Signed)
ASHEA, WINIARSKI ([id number]) Visit Report for 03/02/2015 HBO Details Patient Name: [name] [name] [name] [name]. Date of Service: 03/02/2015 10:00 AM Medical Record Number: [id number] Patient Account Number: [id number] Date of Birth/Sex: [birth year] (39 y.o. Female) Treating RN: Primary Care Physician: [name] Other Clinician: [name] Referring Physician: [name] Treating Physician/Extender: [name] in Treatment: 13 HBO Treatment Course Details Treatment Course Ordering Physician: [name] 1 Number: HBO Treatment Start Date: 02/19/2015 Total Treatments 30 Ordered: HBO Indication: Diabetic Ulcer(s) of the Lower Extremity HBO Treatment Details Treatment Number: 8 Patient Type: Outpatient Chamber Type: Monoplace Chamber #: [id number] Treatment Protocol: 2.0 ATA with 90 minutes oxygen, and no air breaks Treatment Details Compression Rate Down: 1.5 psi / minute De-Compression Rate Up: 1.5 psi / minute Air breaks and breathing Compress Tx Pressure Decompress Decompress periods Begins Reached Begins Ends (leave unused spaces blank) Chamber Pressure 1 ATA 2.0 ATA - - - - - - 2.0 ATA 1 ATA Clock Time (24 hr) 09:21 09:32 - - - - - - 11:02 11:13 Treatment Length: 112 (minutes) Treatment Segments: 4 Capillary Blood Glucose Pre Capillary Blood Glucose (mg/dl): Post Capillary Blood Glucose (mg/dl): Vital Signs Capillary Blood Glucose Reference Range: 80 - 120 mg / dl HBO Diabetic Blood Glucose Intervention Range: <131 mg/dl or >865 mg/dl Time Vitals Blood Respiratory Capillary Blood Glucose Pulse Action Type: Pulse: Temperature: Taken: Pressure: Rate: Glucose (mg/dl): Meter #: Oximetry (%) Taken: Pre 09:01 135/75 78 18 97.7 164 1 none Post 11:17 132/96 84 18 97.8 138 1 none Treatment Response Treatment Completion Status: Treatment Completed without Adverse Event Fike, Merridy M. ([id number]) HBO Attestation I certify that I supervised this  HBO treatment in accordance with Medicare guidelines. A trained Yes emergency response team is readily available per hospital policies and procedures. Continue HBOT as ordered. Yes Electronic Signature(s) Signed: 03/02/2015 1:02:30 PM By: [name] MD, FACS Entered By: [name] on 03/02/2015 13:02:30 [name] [name] ([id number]) -------------------------------------------------------------------------------- HBO Safety Checklist Details Patient Name: [name] [name] M. Date of Service: 03/02/2015 10:00 AM Medical Record Number: [id number] Patient Account Number: [id number] Date of Birth/Sex: 1975-09-06 (39 y.o. Female) Treating RN: Primary Care Physician: [name] Other Clinician: [name] Referring Physician: [name] Treating Physician/Extender: [name] in Treatment: 13 HBO Safety Checklist Items Safety Checklist Consent Form Signed Patient voided / foley secured and emptied When did you last eato 19:00 pm on 03/01/15 Last dose of injectable or oral agent 08:00 am NA Ostomy pouch emptied and vented if applicable NA All implantable devices assessed, documented and approved NA Intravenous access site secured and place Valuables secured Linens and cotton and cotton/polyester blend (less than 51% polyester) Personal oil-based products / skin lotions / body lotions removed Wigs or hairpieces removed Smoking or tobacco materials removed Books / newspapers / magazines / loose paper removed Cologne, aftershave, perfume and deodorant removed Jewelry removed (may wrap wedding band) Make-up removed Hair care products removed Battery operated devices (external) removed Heating patches and chemical warmers removed NA Titanium eyewear removed Nail polish cured greater than 10 hours NA Casting material cured greater than 10 hours NA Hearing aids removed NA Loose dentures or partials removed NA Prosthetics have been removed Patient  demonstrates correct use of air break device (if applicable) Patient concerns have been addressed Patient grounding bracelet on and cord attached to chamber Specifics for Inpatients (complete in addition to above) Medication sheet sent with patient Intravenous medications needed or due during therapy sent with patient LARIE, MATHES. ([id number])  Drainage tubes (e.g. nasogastric tube or chest tube secured and vented) Endotracheal or Tracheotomy tube secured Cuff deflated of air and inflated with saline Airway suctioned Electronic Signature(s) Signed: 03/02/2015 3:23:14 PM By: Dayton Martes RCP, RRT, CHT Entered By: Dayton Martes on 03/02/2015 09:25:31

## 2015-03-03 NOTE — Progress Notes (Signed)
[name], [name] ([id number]) Visit Report for 03/02/2015 Arrival Information Details Patient Name: [name], [name] Date of Service: 03/02/2015 8:45 AM Medical Record Number: [id number] Patient Account Number: [id number] Date of Birth/Sex: 1976-02-09 (39 y.o. Female) Treating RN: Huel Coventry Primary Care Physician: [name] Other Clinician: Referring Physician: [name] Treating Physician/Extender: [name] in Treatment: 13 Visit Information History Since Last Visit Added or deleted any medications: Yes Patient Arrived: Ambulatory Any new allergies or adverse reactions: No Arrival Time: 08:55 Had a fall or experienced change in No Accompanied By: self activities of daily living that may affect Transfer Assistance: None risk of falls: Patient Identification Verified: Yes Signs or symptoms of abuse/neglect No Secondary Verification Process Yes since last visito Completed: Has Dressing in Place as Prescribed: Yes Patient Has Alerts: Yes Has Footwear/Offloading in Place as Yes Patient Alerts: Type II Diabetic Prescribed: 11/30/2014 ABI: Left: Surgical Shoe with (L) 1.24 (R) Pressure Relief Insole 0.92 Right: Surgical Shoe with Pressure Relief Insole Pain Present Now: No Electronic Signature(s) Signed: 03/02/2015 5:06:19 PM By: Elliot Gurney, RN, BSN, Kim RN, BSN Entered By: Elliot Gurney, RN, BSN, Kim on 03/02/2015 08:56:05 Wojtaszek, [name] ([id number]) -------------------------------------------------------------------------------- Encounter Discharge Information Details Patient Name: [name], [name] Date of Service: 03/02/2015 8:45 AM Medical Record Number: [id number] Patient Account Number: [id number] Date of Birth/Sex: [birth year] (39 y.o. Female) Treating RN: Huel Coventry Primary Care Physician: [name] Other Clinician: Referring Physician: [name] Treating Physician/Extender: [name] in Treatment: 41 Encounter Discharge  Information Items Discharge Pain Level: 1 Discharge Condition: Stable Ambulatory Status: Ambulatory Other (Note Discharge Destination: Required) Transportation: Private Auto Accompanied By: self Schedule Follow-up Appointment: Yes Medication Reconciliation completed and provided to Patient/Care Yes Elige Shouse: Clinical Summary of Care: Notes Patient has an HBO treatment scheduled on 03/02/15 at 10:00 am Electronic Signature(s) Signed: 03/02/2015 5:06:19 PM By: Elliot Gurney, RN, BSN, Kim RN, BSN Entered By: Elliot Gurney, RN, BSN, Kim on 03/02/2015 09:15:56 Batchelder, [name] ([id number]) -------------------------------------------------------------------------------- Lower Extremity Assessment Details Patient Name: Edelson, Allure M. Date of Service: 03/02/2015 8:45 AM Medical Record Number: [id number] Patient Account Number: [id number] Date of Birth/Sex: 07/14/1975 (39 y.o. Female) Treating RN: Huel Coventry Primary Care Physician: [name] Other Clinician: Referring Physician: [name] Treating Physician/Extender: [name] in Treatment: 13 Vascular Assessment Pulses: Posterior Tibial Dorsalis Pedis Palpable: [Left:Yes] Extremity colors, hair growth, and conditions: Extremity Color: [Left:Normal] Hair Growth on Extremity: [Left:Yes] Temperature of Extremity: [Left:Warm] Capillary Refill: [Left:< 3 seconds] Toe Nail Assessment Left: Right: Thick: Yes Discolored: Yes Deformed: No Improper Length and Hygiene: Yes Electronic Signature(s) Signed: 03/02/2015 5:06:19 PM By: Elliot Gurney, RN, BSN, Kim RN, BSN Entered By: Elliot Gurney, RN, BSN, Kim on 03/02/2015 08:57:02 [name], [name] ([id number]) -------------------------------------------------------------------------------- Multi Wound Chart Details Patient Name: Litz, Tahiry M. Date of Service: 03/02/2015 8:45 AM Medical Record Number: [id number] Patient Account Number: [id number] Date of Birth/Sex: March 30, 1976 (39 y.o.  Female) Treating RN: Huel Coventry Primary Care Physician: [name] Other Clinician: Referring Physician: [name] Treating Physician/Extender: [name] in Treatment: 13 Vital Signs Height(in): 67 Pulse(bpm): 78 Weight(lbs): 290 Blood Pressure 135/75 (mmHg): Body Mass Index(BMI): 45 Temperature(F): 97.7 Respiratory Rate 18 (breaths/min): Photos: [1:No Photos] [N/A:N/A] Wound Location: [1:Left Foot - Plantar, Lateral N/A] Wounding Event: [1:Gradually Appeared] [N/A:N/A] Primary Etiology: [1:Diabetic Wound/Ulcer of N/A the Lower Extremity] Comorbid History: [1:Type II Diabetes] [N/A:N/A] Date Acquired: [1:11/21/2013] [N/A:N/A] Weeks of Treatment: [1:13] [N/A:N/A] Wound Status: [1:Open] [N/A:N/A] Measurements L x W x D 1x0.5x1.8 [N/A:N/A] (cm) Area (cm) : [1:0.393] [N/A:N/A] Volume (  cm) : [1:0.707] [N/A:N/A] % Reduction in Area: [1:82.50%] [N/A:N/A] % Reduction in Volume: 65.00% [N/A:N/A] Classification: [1:Grade 3] [N/A:N/A] Wagner Verification: [1:Abscess] [N/A:N/A] Exudate Amount: [1:Large] [N/A:N/A] Exudate Type: [1:Serosanguineous] [N/A:N/A] Exudate Color: [1:red, brown] [N/A:N/A] Foul Odor After [1:Yes] [N/A:N/A] Cleansing: Odor Anticipated Due to No [N/A:N/A] Product Use: Wound Margin: [1:Thickened] [N/A:N/A] Granulation Amount: [1:Medium (34-66%)] [N/A:N/A] Granulation Quality: [1:Pink] [N/A:N/A] Necrotic Amount: [1:Small (1-33%)] [N/A:N/A] Exposed Structures: [N/A:N/A] Bone: Yes Fascia: No Fat: No Tendon: No Muscle: No Joint: No Epithelialization: Small (1-33%) N/A N/A Periwound Skin Texture: Edema: Yes N/A N/A Callus: Yes Excoriation: No Induration: No Crepitus: No Fluctuance: No Friable: No Rash: No Scarring: No Periwound Skin Moist: Yes N/A N/A Moisture: Maceration: No Dry/Scaly: No Periwound Skin Color: Atrophie Blanche: No N/A N/A Cyanosis: No Ecchymosis: No Erythema: No Hemosiderin Staining: No Mottled:  No Pallor: No Rubor: No Temperature: No Abnormality N/A N/A Tenderness on No N/A N/A Palpation: Wound Preparation: Ulcer Cleansing: N/A N/A Rinsed/Irrigated with Saline Topical Anesthetic Applied: Other: lidocaine 4% Treatment Notes Electronic Signature(s) Signed: 03/02/2015 5:06:19 PM By: Elliot Gurney, RN, BSN, Kim RN, BSN Entered By: Elliot Gurney, RN, BSN, Kim on 03/02/2015 09:04:58 [name], [name] ([id number]) -------------------------------------------------------------------------------- Multi-Disciplinary Care Plan Details Patient Name: DINIA, JOYNT. Date of Service: 03/02/2015 8:45 AM Medical Record Number: [id number] Patient Account Number: [id number] Date of Birth/Sex: 1975/10/21 (39 y.o. 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 Anxiety related to knowledge deficit of hyperbaric oxygen therapy and treatment procedures Discomfort related to temperature and humidity changes inside hyperbaric chamber Potential for barotraumas to ears, sinuses, teeth, and lungs or cerebral gas embolism related to changes in atmospheric pressure inside hyperbaric oxygen chamber Potential for oxygen toxicity seizures related to delivery of 100% oxygen at an increased atmospheric pressure Potential for pulmonary oxygen toxicity related to delivery of 100% oxygen at an increased atmospheric pressure Goals: Barotrauma will be prevented during HBO2 Date Initiated: 02/15/2015 Goal Status: Active Patient and/or family will be able to state/discuss factors appropriate to the management of their disease process during treatment Date Initiated: 02/15/2015 Goal Status: Active Patient will tolerate the hyperbaric oxygen therapy  treatment Date Initiated: 02/15/2015 Goal Status: Active Patient will tolerate the internal climate of the chamber Date Initiated: 02/15/2015 Goal Status: Active Patient/caregiver will verbalize understanding of HBO goals, rationale, procedures and potential hazards Date Initiated: 02/15/2015 Goal Status: Active Signs and symptoms of pulmonary oxygen toxicity will be recognized and promptly addressed Date Initiated: 02/15/2015 Goal Status: Active Signs and symptoms of seizure will be recognized and promptly addressed ; seizing patients will suffer no harm Date Initiated: 02/15/2015 Dadamo, [name] Goal Status: Active Interventions: Administer a five (5) minute air break for patient if signs and symptoms of seizure appear and notify the hyperbaric physician Administer the correct therapeutic gas delivery based on the patients needs and limitations, per physician order Assess and provide for patientos comfort related to the hyperbaric environment and equalization of middle ear Assess for signs and symptoms related to adverse events, including but not limited to confinement anxiety, pneumothorax, oxygen toxicity and baurotrauma Assess patient for any history of confinement anxiety Assess patient's knowledge and expectations regarding hyperbaric medicine and provide education related to the hyperbaric environment, goals of treatment and prevention of adverse events Notes: Nutrition Nursing Diagnoses: Potential for alteratiion in Nutrition/Potential for imbalanced  nutrition Goals: Patient/caregiver agrees to and verbalizes understanding of need to use nutritional supplements and/or vitamins as prescribed Date Initiated: 11/30/2014 Goal Status: Active Interventions: Assess patient nutrition upon admission and as needed per policy Notes: Orientation to the Wound Care Program Nursing Diagnoses: Knowledge deficit related to the wound healing center  program Goals: Patient/caregiver will verbalize understanding of the Wound Healing Center Program Date Initiated: 11/30/2014 Goal Status: Active Interventions: Provide education on orientation to the wound center Notes: [name], [name] ([id number]) Peripheral Neuropathy Nursing Diagnoses: Potential alteration in peripheral tissue perfusion (select prior to confirmation of diagnosis) Goals: Patient/caregiver will verbalize understanding of disease process and disease management Date Initiated: 11/30/2014 Goal Status: Active Interventions: Assess signs and symptoms of neuropathy upon admission and as needed Notes: Wound/Skin Impairment Nursing Diagnoses: Impaired tissue integrity Knowledge deficit related to smoking impact on wound healing Goals: Ulcer/skin breakdown will have a volume reduction of 30% by week 4 Date Initiated: 11/30/2014 Goal Status: Active Interventions: Assess ulceration(s) every visit Notes: Electronic Signature(s) Signed: 03/02/2015 5:06:19 PM By: Elliot Gurney, RN, BSN, Kim RN, BSN Entered By: Elliot Gurney, RN, BSN, Kim on 03/02/2015 09:04:46 [name], [name] -------------------------------------------------------------------------------- Patient/Caregiver Education Details Patient Name: [name], [name] Date of Service: 03/02/2015 8:45 AM Medical Record Number: [id number] Patient Account Number: [id number] Date of Birth/Gender: [birth year] (39 y.o. Female) Treating RN: Huel Coventry Primary Care Physician: [name] Other Clinician: Referring Physician: [name] Treating Physician/Extender: [name] in Treatment: 13 Education Assessment Education Provided To: Patient Education Topics Provided Hyperbaric Oxygenation: Handouts: Other: continue treatments Methods: Demonstration, Explain/Verbal Wound/Skin Impairment: Handouts: Other: continue wound care as prescribed Methods: Demonstration, Explain/Verbal Responses: State  content correctly Electronic Signature(s) Signed: 03/02/2015 5:06:19 PM By: Elliot Gurney, RN, BSN, Kim RN, BSN Entered By: Elliot Gurney, RN, BSN, Kim on 03/02/2015 09:18:57 [name], [name] -------------------------------------------------------------------------------- Wound Assessment Details Patient Name: [name], [name] Date of Service: 03/02/2015 8:45 AM Medical Record Number: [id number] Patient Account Number: [id number] Date of Birth/Sex: 1975/07/10 (39 y.o. Female) Treating RN: Huel Coventry Primary Care Physician: [name] Other Clinician: Referring Physician: [name] Treating Physician/Extender: [name] in Treatment: 13 Wound Status Wound Number: 1 Primary Diabetic Wound/Ulcer of the Lower Etiology: Extremity Wound Location: Left Foot - Plantar, Lateral Wound Status: Open Wounding Event: Gradually Appeared Comorbid Type II Diabetes Date Acquired: 11/21/2013 History: Weeks Of Treatment: 13 Clustered Wound: No Photos Photo Uploaded By: Elliot Gurney, RN, BSN, Kim on 03/02/2015 10:48:51 Wound Measurements Length: (cm) 1 Width: (cm) 0.5 Depth: (cm) 1.8 Area: (cm) 0.393 Volume: (cm) 0.707 % Reduction in Area: 82.5% % Reduction in Volume: 65% Epithelialization: Small (1-33%) Wound Description Classification: Grade 3 Wagner Verification: Abscess Wound Margin: Thickened Exudate Amount: Large Exudate Type: Serosanguineous Exudate Color: red, brown Foul Odor After Cleansing: Yes Due to Product Use: No Wound Bed Granulation Amount: Medium (34-66%) Exposed Structure Granulation Quality: Pink Fascia Exposed: No Necrotic Amount: Small (1-33%) Fat Layer Exposed: No [name], [name] ([id number]) Necrotic Quality: Adherent Slough Tendon Exposed: No Muscle Exposed: No Joint Exposed: No Bone Exposed: Yes Periwound Skin Texture Texture Color No Abnormalities Noted: No No Abnormalities Noted: No Callus: Yes Atrophie Blanche: No Crepitus:  No Cyanosis: No Excoriation: No Ecchymosis: No Fluctuance: No Erythema: No Friable: No Hemosiderin Staining: No Induration: No Mottled: No Localized Edema: Yes Pallor: No Rash: No Rubor: No Scarring: No Temperature / Pain Moisture Temperature: No Abnormality No Abnormalities Noted: No Dry / Scaly: No Maceration: No Moist: Yes Wound Preparation Ulcer Cleansing: Rinsed/Irrigated with Saline Topical Anesthetic  Applied: Other: lidocaine 4%, Treatment Notes Wound #1 (Left, Lateral, Plantar Foot) 1. Cleansed with: Clean wound with Normal Saline 2. Anesthetic Topical Lidocaine 4% cream to wound bed prior to debridement 3. Peri-wound Care: Skin Prep 4. Dressing Applied: Iodoform packing Gauze 5. Secondary Dressing Applied Bordered Foam Dressing Notes [employer] Electronic Signature(s) Signed: 03/02/2015 5:06:19 PM By: Elliot Gurney, RN, BSN, Kim RN, BSN Entered By: Elliot Gurney, RN, BSN, Kim on 03/02/2015 08:57:41 [name], [name] ([id number]) Oriol, [name] ([id number]) -------------------------------------------------------------------------------- Vitals Details Patient Name: [name], [name] Date of Service: 03/02/2015 8:45 AM Medical Record Number: [id number] Patient Account Number: [id number] Date of Birth/Sex: 03-24-1976 (39 y.o. Female) Treating RN: Huel Coventry Primary Care Physician: [name] Other Clinician: Referring Physician: [name] Treating Physician/Extender: [name] in Treatment: 13 Vital Signs Time Taken: 08:55 Temperature (F): 97.7 Height (in): 67 Pulse (bpm): 78 Weight (lbs): 290 Respiratory Rate (breaths/min): 18 Body Mass Index (BMI): 45.4 Blood Pressure (mmHg): 135/75 Reference Range: 80 - 120 mg / dl Electronic Signature(s) Signed: 03/02/2015 5:06:19 PM By: Elliot Gurney, RN, BSN, Kim RN, BSN Entered By: Elliot Gurney, RN, BSN, Kim on 03/02/2015 03:47:42

## 2015-03-03 NOTE — Progress Notes (Signed)
[name], [name] ([id number]) Visit Report for 03/02/2015 Chief Complaint Document Details Patient Name: [name], [name] Date of Service: 03/02/2015 8:45 AM Medical Record Number: [id number] Patient Account Number: [id number] Date of Birth/Sex: Dec 25, 1975 (39 y.o. Female) Treating RN: Primary Care Physician: [name] Other Clinician: Referring Physician: [name] Treating Physician/Extender: [name] in Treatment: 13 Information Obtained from: Patient Chief Complaint Patients presents for treatment of an open diabetic ulcer. The patient has had an ulcerated wound on her left lower extremity for about 2 years. Electronic Signature(s) Signed: 03/02/2015 9:21:18 AM By: [name] MD, FACS Entered By: [name] on 03/02/2015 09:21:18 Cordon, [name] ([id number]) -------------------------------------------------------------------------------- Debridement Details Patient Name: [name], Valena M. Date of Service: 03/02/2015 8:45 AM Medical Record Number: [id number] Patient Account Number: [id number] Date of Birth/Sex: 28-Jul-1975 (39 y.o. Female) Treating RN: Primary Care Physician: [name] Other Clinician: Referring Physician: [name] Treating Physician/Extender: [name] in Treatment: 13 Debridement Performed for Wound #1 Left,Lateral,Plantar Foot Assessment: Performed By: Physician Tristan Schroeder., MD Debridement: Debridement Pre-procedure Yes Verification/Time Out Taken: Start Time: 09:02 Pain Control: Other : lidocaine 4% Level: Skin/Subcutaneous Tissue Total Area Debrided (L x 1 (cm) x 0.5 (cm) = 0.5 (cm) W): Tissue and other Viable, Non-Viable, Exudate, Fibrin/Slough, Skin, Subcutaneous material debrided: Instrument: Forceps, Scissors Bleeding: Minimum Hemostasis Achieved: Pressure End Time: 09:07 Procedural Pain: 0 Post Procedural Pain: 0 Response to Treatment: Procedure was tolerated well Post  Debridement Measurements of Total Wound Length: (cm) 1.5 Width: (cm) 1 Depth: (cm) 1.8 Volume: (cm) 2.121 Post Procedure Diagnosis Same as Pre-procedure Electronic Signature(s) Signed: 03/02/2015 9:21:10 AM By: [name] MD, FACS Entered By: [name] on 03/02/2015 09:21:06 Nhan, [name] ([id number]) -------------------------------------------------------------------------------- HPI Details Patient Name: [name], [name] Date of Service: 03/02/2015 8:45 AM Medical Record Number: [id number] Patient Account Number: [id number] Date of Birth/Sex: 03-02-1976 (39 y.o. Female) Treating RN: Primary Care Physician: [name] Other Clinician: Referring Physician: [name] Treating Physician/Extender: [name] in Treatment: 13 History of Present Illness Location: left foot ulcer Quality: Patient reports No Pain. Severity: Wound is moderately severed with some exposed structure in the wound bed. Duration: Patient has had the wound for > 2 years prior to seeking treatment at the wound center Timing: for the last 2 years she has been treated at the [facility] wound care center Context: The wound appeared gradually over time Modifying Factors: Other treatment(s) tried include:in the last month she has been operated on the left foot for an abscess of that foot and was in hospital for 2 weeks. Associated Signs and Symptoms: Periwound excoriated and wound draining heavily HPI Description: 39 year old patient who is known to be a diabetic comes from the office of Dr. Dario Guardian for a ulcer on the left lower extremity and foot. She has had an ulcer on the left foot and has had previous treatment at [facility]. the patient tells [name] that she has been seen as the outpatient center for a long while and has had various tests including MRIs done but she is not a very good historian. Most recently about a month ago she had a abscess drained from her left foot and was in hospital for  about 2 weeks and was then discharged with a PICC line and IV antibioticstics which she knows may be vancomycin and Levaquin. She has never had a wound VAC now has she had hyperbaric oxygen therapy. Past medical history significant for dyspnea, hypertension, hyperlipidemia, neuropathy, diabetes type 2 uncontrolled. She smokes cigarettes  the latest blood reports are from March 2016 and show that her BMP is essentially normal her lipid panel was also normal her hemoglobin A1c was 9.4. 12/07/2014 -- Her tissue culture is back and she has grown significant amounts of Proteus mirabilis, pseudomonas aeruginosa and Staphylococcus aureus. The staph is a MSSA and all the cultures are sensitive to ciprofloxacin. I will start her on Cipro 500 mg by mouth twice a day for 14 days. Xray left foot -- IMPRESSION:No evidence of osteomyelitis. Healed fracture of the distal left third metatarsal. 12/21/2014 -- she is still awaiting clearance from her insurance company for HBOT. She was seen by Dr. Dario Guardian was working on controlling her diabetes and she continues to work on giving up smoking. She has completed her dosage of ciprofloxacin and we will be continuing with local care. 01/03/2015 -- her insurance clearance is still not in and we are awaiting this before starting her on a hyperbaric oxygen therapy. 01/09/2015 - we are still awaiting her insurance clearance and other than that she is doing fine. 01/26/2015 -- she was unable to tolerate the total contact cast and hence had a Darco shoe back on her left foot. Her blood sugars have been doing better and her last hemoglobin A1c report is pending. He continues to smoke 6-8 cigarettes a day. [name], [name] ([id number]) 02/02/2015 -- yesterday her left leg started getting swollen and has red streaks starting at the foot and going up to her knee. She has had a mild fever and has not gone to see her PCP or [facility] where she normally gets her  treatment for this. 02/15/2015 - she has recently been discharged from [facility] after she was admitted for a cellulitis abscess and osteomyelitis of her left foot. We haven't received all her reports yet but as per the patient she was on IV antibiotics for 10 days and then discharged home on Augmentin. Addendum -- was able to look at her records in Epic. Hospital Course: ERDINE HULEN is a 39 y.o. female with PMH of HTN, DM, depression, and tobacco abuse who presented with left diabetic foot ulcer and cellulitis. Left leg cellulitis/Left diabetic foot ulcer with osteomyelitis: The patient presented with worsening pain and green pus drainage from her diabetic foot ulcer and surrounding cellulitis. Most recently, she was hospitalized 4/12-4/19 for this foot wound. She was treated with Vancomycin and Zosyn. ID was consulted and recommended that the patient receive Daptomycin, Ceftazidime, and Flagyl x 2 weeks. She had a PICC line placed prior to discharge. After discharge, she was transitioned to oral antibiotics. Wound cultures have grown GAS, GBS, MSSA, and MRSA.Marland Kitchen She notes that she was last treated with Ciprofloxacin for about 2 weeks and completed this course at least 2 weeks ago. She continues to follow with her wound clinic in [place] weekly. - Obtained MRI LLE which showed osteomyelitis of the left fourth metatarsal head. IMPRESSION: Findings compatible with acute osteomyelitis of the left fourth metatarsal head, with question of nondisplaced pathologic fracture. Plantar ulcer underlying the fourth MTP joint and diffuse soft tissue edema are noted. - Consulted Vascular Surgery, appreciate assistance. Performed left foot ulcer/bone debridement 8/15, sent bone for culture which showed no growth to date. - Started IV Vancomycin, Ceftaz, and Flagyl on admission. Transitioned to IV Unasyn 8/15. Given negative bone cultures, will transition to oral Augmentin for 2 week course at  discharge. - Provided wound care w/ wet to dry packing to left foot wound with Kerlix  wrap, change daily. - Per Vascular Surgery, WBAT LLE with offloading shoe. - Provided pain control with Oxycodone PRN, although pain had improved considerably prior to discharge. - Plan for follow up with Wound Clinic in [place] within 1 week after discharge. Arterial Duplex also done -- Final Interpretationo-- Left: Normal examination. No evidence of arterial occlusive disease.o LT Great toe pressure = 82 mmHg. No significant change asoocompared to previous study.o 02/23/2015 -- he has not Been able to see Dr. Sampson Goon and is running out of her Augmentin so we shall extend this. She had a stomach bug yesterday and could not make it to HBO. Continues to smoke but is working on quitting. 03/02/2015 -- she was seen by Dr. Sampson Goon yesterday and he has put on doxycycline and ciprofloxacin for 3 weeks. She continues with hyperbaric oxygen therapy and has been doing fine. Electronic Signature(s) Signed: 03/02/2015 9:22:25 AM By: [name] MD, FACS Entered By: [name] on 03/02/2015 09:22:25 Farve, [name] ([id number]) Pacetti, [name] ([id number]) -------------------------------------------------------------------------------- Physical Exam Details Patient Name: Boggess, Syniyah M. Date of Service: 03/02/2015 8:45 AM Medical Record Number: [id number] Patient Account Number: [id number] Date of Birth/Sex: 11-03-1975 (39 y.o. Female) Treating RN: Primary Care Physician: [name] Other Clinician: Referring Physician: [name] Treating Physician/Extender: [name] in Treatment: 13 Constitutional . Pulse regular. Respirations normal and unlabored. Afebrile. . Eyes Nonicteric. Reactive to light. Ears, Nose, Mouth, and Throat Lips, teeth, and gums WNL.Marland Kitchen Moist mucosa without lesions . Neck supple and nontender. No palpable supraclavicular or cervical adenopathy.  Normal sized without goiter. Respiratory WNL. No retractions.. Cardiovascular Pedal Pulses WNL. No clubbing, cyanosis or edema. Chest Breasts symmetical and no nipple discharge.. Breast tissue WNL, no masses, lumps, or tenderness.. Lymphatic No adneopathy. No adenopathy. No adenopathy. Musculoskeletal Adexa without tenderness or enlargement.. Digits and nails w/o clubbing, cyanosis, infection, petechiae, ischemia, or inflammatory conditions.. Integumentary (Hair, Skin) No suspicious lesions. No crepitus or fluctuance. No peri-wound warmth or erythema. No masses.Marland Kitchen Psychiatric Judgement and insight Intact.. No evidence of depression, anxiety, or agitation.. Notes the cellulitis is now subsided and there is some callus and debris which need sharp debridement. The wound probes down to bone and continues to have minimal discharge. Electronic Signature(s) Signed: 03/02/2015 9:24:24 AM By: [name] MD, FACS Entered By: [name] on 03/02/2015 09:24:24 Foxworth, [name] ([id number]) -------------------------------------------------------------------------------- Physician Orders Details Patient Name: Dozal, Zeanna M. Date of Service: 03/02/2015 8:45 AM Medical Record Number: [id number] Patient Account Number: [id number] Date of Birth/Sex: 09-28-1975 (39 y.o. Female) Treating RN: Huel Coventry Primary Care Physician: [name] Other Clinician: Referring Physician: [name] Treating Physician/Extender: [name] in Treatment: 79 Verbal / Phone Orders: Yes Clinician: Huel Coventry Read Back and Verified: Yes Diagnosis Coding Wound Cleansing Wound #1 Left,Lateral,Plantar Foot o Clean wound with Normal Saline. Anesthetic Wound #1 Left,Lateral,Plantar Foot o Topical Lidocaine 4% cream applied to wound bed prior to debridement Primary Wound Dressing Wound #1 Left,Lateral,Plantar Foot o Iodoform packing Gauze Secondary Dressing Wound #1  Left,Lateral,Plantar Foot o Boardered Foam Dressing Dressing Change Frequency Wound #1 Left,Lateral,Plantar Foot o Change dressing every other day. Follow-up Appointments Wound #1 Left,Lateral,Plantar Foot o Return Appointment in 1 week. Off-Loading Wound #1 Left,Lateral,Plantar Foot o Open toe surgical shoe to: Additional Orders / Instructions Wound #1 Left,Lateral,Plantar Foot o Stop Smoking o Increase protein intake. Hyperbaric Oxygen Therapy Anstine, ADLER ALTON. ([id number]) Wound #1 Left,Lateral,Plantar Foot o Evaluate for HBO Therapy o Indication: - Osteomyelitis; Abscess o 2.0 ATA for 90 Minutes without  Air Breaks o One treatment per day (delivered Monday through Friday unless otherwise specified in Special Instructions below): o Total # of Treatments: - 30-40 o Follow Hyperbaric Oxygen Glycemia Protocol Medications-please add to medication list. Wound #1 Left,Lateral,Plantar Foot o P.O. Antibiotics - Doxycycline and Cipro prescribed by Dr. Cresenciano Lick INTERVENTIONS PROTOCOL PRE-HBO GLYCEMIA INTERVENTIONS ACTION INTERVENTION Obtain pre-HBO capillary blood 1 glucose (ensure physician order is in chart). A. Notify HBO physician and await physician orders. 2 If result is 70 mg/dl or below: B. If the result meets the hospital definition of a critical result, follow hospital policy. A. Give patient an 8 ounce Glucerna Shake, an 8 ounce Ensure, or 8 ounces of a Glucerna/Ensure equivalent dietary supplement*. B. Wait 30 minutes. If result is 71 mg/dl to 161 mg/dl: C. Retest patientos capillary blood glucose (CBG). D. If result greater than or equal to 110 mg/dl, proceed with HBO. If result less than 110 mg/dl, notify HBO physician and consider holding HBO. If result is 131 mg/dl to 096 mg/dl: A. Proceed with HBO. A. Notify HBO physician and await physician orders. B. It is recommended to hold HBO and do blood/urine  ketone If result is 250 mg/dl or greater: testing. C. If the result meets the hospital definition of a critical result, follow hospital policy. POST-HBO GLYCEMIA INTERVENTIONS ACTION INTERVENTION Nakamura, [name] ([id number]) 1 Obtain post HBO capillary blood glucose (ensure physician order is in chart). A. Notify HBO physician and await physician orders. 2 If result is 70 mg/dl or below: B. If the result meets the hospital definition of a critical result, follow hospital policy. A. Give patient an 8 ounce Glucerna Shake, an 8 ounce Ensure, or 8 ounces of a Glucerna/Ensure equivalent dietary supplement*. B. Wait 15 minutes for symptoms of hypoglycemia (i.e. nervousness, anxiety, If result is 71 mg/dl to 914 mg/dl: sweating, chills, clamminess, irritability, confusion, tachycardia or dizziness). C. If patient asymptomatic, discharge patient. If patient symptomatic, repeat capillary blood glucose (CBG) and notify HBO physician. If result is 101 mg/dl to 782 mg/dl: A. Discharge patient. A. Notify HBO physician and await physician orders. B. It is recommended to do If result is 250 mg/dl or greater: blood/urine ketone testing. C. If the result meets the hospital definition of a critical result, follow hospital policy. *Juice or candies are NOT equivalent products. If patient refuses the Glucerna or Ensure, please consult the hospital dietitian for an appropriate substitute. Electronic Signature(s) Signed: 03/02/2015 4:37:26 PM By: [name] MD, FACS Signed: 03/02/2015 5:06:19 PM By: Elliot Gurney RN, BSN, Kim RN, BSN Entered By: Elliot Gurney, RN, BSN, Kim on 03/02/2015 09:06:39 Plucinski, [name] ([id number]) -------------------------------------------------------------------------------- Problem List Details Patient Name: Artz, Tria M. Date of Service: 03/02/2015 8:45 AM Medical Record Number: [id number] Patient Account Number: [id number] Date of Birth/Sex: September 30, 1975  (39 y.o. 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 F17.218 Nicotine dependence, cigarettes, with other nicotine- 11/30/2014 Yes induced disorders E66.01 Morbid (severe) obesity due to excess calories 11/30/2014 Yes L03.116 Cellulitis of left lower limb 02/02/2015 Yes 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 Date of Service: 03/02/2015 8:45 AM Medical Record Number: [id number] Patient Account Number: [id number] Date of Birth/Sex: Jul 07, 1975 (39 y.o. Female) Treating RN: Primary Care Physician: [name] Other Clinician: Referring Physician: [name] Treating Physician/Extender: [name] in Treatment: 13 Subjective Chief Complaint Information obtained from Patient Patients presents for treatment of an open diabetic ulcer. The patient has had an ulcerated wound on her left lower extremity for about 2 years. History of Present Illness (HPI) The following HPI elements were documented for the patient's wound: Location: left foot ulcer Quality: Patient reports No Pain. Severity: Wound is moderately severed with some exposed structure  in the wound bed. Duration: Patient has had the wound for > 2 years prior to seeking treatment at the wound center Timing: for the last 2 years she has been treated at the [facility] wound care center Context: The wound appeared gradually over time Modifying Factors: Other treatment(s) tried include:in the last month she has been operated on the left foot for an abscess of that foot and was in hospital for 2 weeks. Associated Signs and Symptoms: Periwound excoriated and wound draining heavily 39 year old patient who is known to be a diabetic comes from the office of Dr. Dario Guardian for a ulcer on the left lower extremity and foot. She has had an ulcer on the left foot and has had previous treatment at [facility]. the patient tells [name] that she has been seen as the outpatient center for a long while and has had various tests including MRIs done but she is not a very good historian. Most recently about a month ago she had a abscess drained from her left foot and was in hospital for about 2 weeks and was then discharged with a PICC line and IV antibioticstics which she knows may be vancomycin and Levaquin. She has never had a wound VAC now has she had hyperbaric oxygen therapy. Past medical history significant for dyspnea, hypertension, hyperlipidemia, neuropathy, diabetes type 2 uncontrolled. She smokes cigarettes the latest blood reports are from March 2016 and show that her BMP is essentially normal her lipid panel was also normal her hemoglobin A1c was 9.4. 12/07/2014 -- Her tissue culture is back and she has grown significant amounts of Proteus mirabilis, pseudomonas aeruginosa and Staphylococcus aureus. The staph is a MSSA and all the cultures are sensitive to ciprofloxacin. I will start her on Cipro 500 mg by mouth twice a day for 14 days. Xray left foot -- IMPRESSION:No evidence of osteomyelitis. Healed fracture of the distal left third metatarsal. Gwynne, [name] ([id number]) 12/21/2014 -- she is  still awaiting clearance from her insurance company for HBOT. She was seen by Dr. Dario Guardian was working on controlling her diabetes and she continues to work on giving up smoking. She has completed her dosage of ciprofloxacin and we will be continuing with local care. 01/03/2015 -- her insurance clearance is still not in and we are awaiting this before starting her on a hyperbaric oxygen therapy. 01/09/2015 - we are still awaiting her insurance clearance and other than that she is doing fine. 01/26/2015 -- she was unable to tolerate the total contact cast and hence had a Darco shoe back on her left foot. Her blood sugars have been doing better and her last hemoglobin A1c report is pending. He continues to smoke 6-8 cigarettes a day. 02/02/2015 -- yesterday her left  leg started getting swollen and has red streaks starting at the foot and going up to her knee. She has had a mild fever and has not gone to see her PCP or [facility] where she normally gets her treatment for this. 02/15/2015 - she has recently been discharged from [facility] after she was admitted for a cellulitis abscess and osteomyelitis of her left foot. We haven't received all her reports yet but as per the patient she was on IV antibiotics for 10 days and then discharged home on Augmentin. Addendum -- was able to look at her records in Epic. Hospital Course: ALIANIS TRIMMER is a 39 y.o. female with PMH of HTN, DM, depression, and tobacco abuse who presented with left diabetic foot ulcer and cellulitis. Left leg cellulitis/Left diabetic foot ulcer with osteomyelitis: The patient presented with worsening pain and green pus drainage from her diabetic foot ulcer and surrounding cellulitis. Most recently, she was hospitalized 4/12-4/19 for this foot wound. She was treated with Vancomycin and Zosyn. ID was consulted and recommended that the patient receive Daptomycin, Ceftazidime, and Flagyl x 2 weeks. She had a PICC line  placed prior to discharge. After discharge, she was transitioned to oral antibiotics. Wound cultures have grown GAS, GBS, MSSA, and MRSA.Marland Kitchen She notes that she was last treated with Ciprofloxacin for about 2 weeks and completed this course at least 2 weeks ago. She continues to follow with her wound clinic in [place] weekly. - Obtained MRI LLE which showed osteomyelitis of the left fourth metatarsal head. IMPRESSION: Findings compatible with acute osteomyelitis of the left fourth metatarsal head, with question of nondisplaced pathologic fracture. Plantar ulcer underlying the fourth MTP joint and diffuse soft tissue edema are noted. - Consulted Vascular Surgery, appreciate assistance. Performed left foot ulcer/bone debridement 8/15, sent bone for culture which showed no growth to date. - Started IV Vancomycin, Ceftaz, and Flagyl on admission. Transitioned to IV Unasyn 8/15. Given negative bone cultures, will transition to oral Augmentin for 2 week course at discharge. - Provided wound care w/ wet to dry packing to left foot wound with Kerlix wrap, change daily. - Per Vascular Surgery, WBAT LLE with offloading shoe. - Provided pain control with Oxycodone PRN, although pain had improved considerably prior to discharge. - Plan for follow up with Wound Clinic in [place] within 1 week after discharge. Arterial Duplex also done -- Final Interpretation -- Left: Normal examination. No evidence of arterial occlusive disease. LT Great toe pressure = 82 mmHg. No significant change as compared to previous study. 02/23/2015 -- he has not Been able to see Dr. Sampson Goon and is running out of her Augmentin so we shall extend this. She had a stomach bug yesterday and could not make it to HBO. Continues to smoke but is Hebard, TAIMA RADA. ([id number]) working on quitting. 03/02/2015 -- she was seen by Dr. Sampson Goon yesterday and he has put on doxycycline and ciprofloxacin for 3 weeks. She continues with  hyperbaric oxygen therapy and has been doing fine. Objective Constitutional Pulse regular. Respirations normal and unlabored. Afebrile. Vitals Time Taken: 8:55 AM, Height: 67 in, Weight: 290 lbs, BMI: 45.4, Temperature: 97.7 F, Pulse: 78 bpm, Respiratory Rate: 18 breaths/min, Blood Pressure: 135/75 mmHg. Eyes Nonicteric. Reactive to light. Ears, Nose, Mouth, and Throat Lips, teeth, and gums WNL.Marland Kitchen Moist mucosa without lesions . Neck supple and nontender. No palpable supraclavicular or cervical adenopathy. Normal sized without goiter. Respiratory WNL. No retractions.. Cardiovascular Pedal Pulses WNL. No clubbing, cyanosis or  edema. Chest Breasts symmetical and no nipple discharge.. Breast tissue WNL, no masses, lumps, or tenderness.. Lymphatic No adneopathy. No adenopathy. No adenopathy. Musculoskeletal Adexa without tenderness or enlargement.. Digits and nails w/o clubbing, cyanosis, infection, petechiae, ischemia, or inflammatory conditions.Marland Kitchen Psychiatric Judgement and insight Intact.. No evidence of depression, anxiety, or agitation.. General Notes: the cellulitis is now subsided and there is some callus and debris which need sharp debridement. The wound probes down to bone and continues to have minimal discharge. Hillery, [name] ([id number]) Integumentary (Hair, Skin) No suspicious lesions. No crepitus or fluctuance. No peri-wound warmth or erythema. No masses.. Wound #1 status is Open. Original cause of wound was Gradually Appeared. The wound is located on the Left,Lateral,Plantar Foot. The wound measures 1cm length x 0.5cm width x 1.8cm depth; 0.393cm^2 area and 0.707cm^3 volume. There is bone exposed. There is a large amount of serosanguineous drainage noted. The wound margin is thickened. There is medium (34-66%) pink granulation within the wound bed. There is a small (1-33%) amount of necrotic tissue within the wound bed including Adherent Slough. The periwound skin  appearance exhibited: Callus, Localized Edema, Moist. The periwound skin appearance did not exhibit: Crepitus, Excoriation, Fluctuance, Friable, Induration, Rash, Scarring, Dry/Scaly, Maceration, Atrophie Blanche, Cyanosis, Ecchymosis, Hemosiderin Staining, Mottled, Pallor, Rubor, Erythema. Periwound temperature was noted as No Abnormality. 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 I have also encouraged her to give up smoking. She will continue with close monitoring and hyperbaric oxygen therapy as planned and this is her best options to control her osteomyelitis. Procedures Wound #1 Wound #1 is a Diabetic Wound/Ulcer of the Lower Extremity located on the Left,Lateral,Plantar Foot . There was a Skin/Subcutaneous Tissue Debridement ([id number]) debridement with total area of 0.5 sq cm performed by Bisma Klett, Ignacia Felling., MD. with the following instrument(s): Forceps and Scissors to remove Viable and Non-Viable tissue/material including Exudate, Fibrin/Slough, Skin, and Subcutaneous after achieving pain control using Other (lidocaine 4%). A time out was conducted prior to the start of the procedure. A Minimum amount of bleeding was controlled with Pressure. The procedure was tolerated well with a pain level of 0 throughout and a pain level of 0 following the procedure. Post Debridement  Measurements: 1.5cm Muenchow, Dena M. ([id number]) length x 1cm width x 1.8cm depth; 2.121cm^3 volume. Post procedure Diagnosis Wound #1: Same as Pre-Procedure Plan Wound Cleansing: Wound #1 Left,Lateral,Plantar Foot: Clean wound with Normal Saline. Anesthetic: Wound #1 Left,Lateral,Plantar Foot: Topical Lidocaine 4% cream applied to wound bed prior to debridement Primary Wound Dressing: Wound #1 Left,Lateral,Plantar Foot: Iodoform packing Gauze Secondary Dressing: Wound #1 Left,Lateral,Plantar Foot: Boardered Foam Dressing Dressing Change Frequency: Wound #1 Left,Lateral,Plantar Foot: Change dressing every other day. Follow-up Appointments: Wound #1 Left,Lateral,Plantar Foot: Return Appointment in 1 week. Off-Loading: Wound #1 Left,Lateral,Plantar Foot: Open toe surgical shoe to: Additional Orders / Instructions: Wound #1 Left,Lateral,Plantar Foot: Stop Smoking Increase protein intake. Hyperbaric Oxygen Therapy: Wound #1 Left,Lateral,Plantar Foot: Evaluate for HBO Therapy Indication: - Osteomyelitis; Abscess 2.0 ATA  for 90 Minutes without Air Breaks One treatment per day (delivered Monday through Friday unless otherwise specified in Special Instructions below): Total # of Treatments: - 30-40 Follow Hyperbaric Oxygen Glycemia Protocol Medications-please add to medication list.: Wound #1 Left,Lateral,Plantar Foot: P.O. Antibiotics - Doxycycline and Cipro prescribed by Dr. Sampson Goon Eckersley, Renesha M. ([id number]) The patient has been doing well on oral ciprofloxacin and doxycycline, hyperbaric oxygen therapy, good control of her blood glucose and local care. I have also encouraged her to give up smoking. She will continue with close monitoring and hyperbaric oxygen therapy as planned and this is her best options to control her osteomyelitis. Electronic Signature(s) Signed: 03/02/2015 9:26:59 AM By: [name] MD, FACS Entered By: [name] on 03/02/2015  09:26:59 [place]abut, [name] ([id number]) -------------------------------------------------------------------------------- SuperBill Details Patient Name: Wilber, Atziry M. Date of Service: 03/02/2015 Medical Record Number: [id number] Patient Account Number: [id number] Date of Birth/Sex: 06-18-1976 (39 y.o. Female) Treating RN: Primary Care Physician: [name] Other Clinician: Referring Physician: [name] Treating Physician/Extender: [name] in Treatment: 13 Diagnosis Coding ICD-10 Codes Code Description E11.621 Type 2 diabetes mellitus with foot ulcer E08.40 Diabetes mellitus due to underlying condition with diabetic neuropathy, unspecified L97.513 Non-pressure chronic ulcer of other part of right foot with necrosis of muscle F17.218 Nicotine dependence, cigarettes, with other nicotine-induced disorders E66.01 Morbid (severe) obesity due to excess calories L03.116 Cellulitis of left lower limb 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, left ankle and foot Facility Procedures CPT4: Description Modifier Quantity Code 84132440 11042 - DEB SUBQ TISSUE 20 SQ CM/< 1 ICD-10 Description Diagnosis E11.621 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  L03.116 Cellulitis of left lower limb 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

## 2015-03-03 NOTE — Progress Notes (Signed)
MAGUIRE, KILLMER ([id number]) Visit Report for 03/02/2015 Arrival Information Details Patient Name: [name], [name] Date of Service: 03/02/2015 10:00 AM Medical Record Number: [id number] Patient Account Number: [id number] Date of Birth/Sex: [birth year] (39 y.o. Female) Treating RN: Primary Care Physician: [name] Other Clinician: [name] Referring Physician: [name] Treating Physician/Extender: [name] in Treatment: 13 Visit Information History Since Last Visit Added or deleted any medications: No Patient Arrived: Ambulatory Any new allergies or adverse reactions: No Arrival Time: 09:15 Had a fall or experienced change in No Accompanied By: serlf activities of daily living that may affect Transfer Assistance: None risk of falls: Patient Identification Verified: Yes Signs or symptoms of abuse/neglect No Secondary Verification Process Yes since last visito Completed: Hospitalized since last visit: No Patient Has Alerts: Yes Has Dressing in Place as Prescribed: Yes Patient Alerts: Type II Diabetic Has Footwear/Offloading in Place as Yes 11/30/2014 ABI: Prescribed: (L) 1.24 (R) Left: Surgical Shoe with 0.92 Pressure Relief Insole Pain Present Now: No Electronic Signature(s) Signed: 03/02/2015 3:23:14 PM By: Dayton Martes RCP, RRT, CHT Entered By: Weyman Rodney, Lucio Edward on 03/02/2015 09:24:11 Mcbean, [name] ([id number]) -------------------------------------------------------------------------------- Encounter Discharge Information Details Patient Name: Leipold, Annalynne M. Date of Service: 03/02/2015 10:00 AM Medical Record Number: [id number] Patient Account Number: [id number] Date of Birth/Sex: 12-29-1975 (39 y.o. Female) Treating RN: Primary Care Physician: [name] Other Clinician: [name] Referring Physician: [name] Treating Physician/Extender: [name] in Treatment:  63 Encounter Discharge Information Items Discharge Pain Level: 0 Discharge Condition: Stable Ambulatory Status: Ambulatory Discharge Destination: Home Private Transportation: Auto Accompanied By: self Schedule Follow-up Appointment: No Medication Reconciliation completed and No provided to Patient/Care Lakesha Levinson: Clinical Summary of Care: Notes Patient has an HBO treatment scheduled on 03/05/15 at 10:00 am. Electronic Signature(s) Signed: 03/02/2015 3:23:14 PM By: Dayton Martes RCP, RRT, CHT Entered By: Weyman Rodney, Lucio Edward on 03/02/2015 11:35:54 Dipierro, [name] ([id number]) -------------------------------------------------------------------------------- Vitals Details Patient Name: Krizan, Mckinley M. Date of Service: 03/02/2015 10:00 AM Medical Record Number: [id number] Patient Account Number: [id number] Date of Birth/Sex: 10-01-1975 (39 y.o. Female) Treating RN: Primary Care Physician: [name] Other Clinician: [name] Referring Physician: [name] Treating Physician/Extender: [name] in Treatment: 13 Vital Signs Time Taken: 09:01 Temperature (F): 97.7 Height (in): 67 Pulse (bpm): 78 Weight (lbs): 290 Respiratory Rate (breaths/min): 18 Body Mass Index (BMI): 45.4 Blood Pressure (mmHg): 135/75 Capillary Blood Glucose (mg/dl): 536 Reference Range: 80 - 120 mg / dl Electronic Signature(s) Signed: 03/02/2015 3:23:14 PM By: Dayton Martes RCP, RRT, CHT Entered By: Weyman Rodney, Lucio Edward on 03/02/2015 09:24:41

## 2015-03-05 ENCOUNTER — Encounter: Payer: Medicaid Other | Admitting: Surgery

## 2015-03-05 DIAGNOSIS — L97513 Non-pressure chronic ulcer of other part of right foot with necrosis of muscle: Secondary | ICD-10-CM | POA: Diagnosis not present

## 2015-03-05 LAB — GLUCOSE, CAPILLARY
GLUCOSE-CAPILLARY: 155 mg/dL — AB (ref 65–99)
Glucose-Capillary: 99 mg/dL (ref 65–99)

## 2015-03-06 ENCOUNTER — Encounter: Payer: Medicaid Other | Admitting: Surgery

## 2015-03-06 NOTE — Progress Notes (Signed)
[name], DELCID ([id number]) Visit Report for 03/05/2015 Arrival Information Details Patient Name: [name], [name] 03/05/2015 10:00 Date of Service: AM Medical Record [id number] Number: Patient Account Number: [id number] 01/02/1976 (39 y.o. Treating RN: Date of Birth/Sex: Female) Other Clinician: [name] Primary Care Physician: [name] Treating Britto, Errol Referring Physician: [name] Physician/Extender: [name] in Treatment: 13 Visit Information History Since Last Visit Added or deleted any medications: No Patient Arrived: Ambulatory Any new allergies or adverse reactions: No Arrival Time: 09:20 Had a fall or experienced change in No Accompanied By: self activities of daily living that may affect Transfer Assistance: None risk of falls: Patient Identification Verified: Yes Signs or symptoms of abuse/neglect No Secondary Verification Process Yes since last visito Completed: Hospitalized since last visit: No Patient Has Alerts: Yes Has Dressing in Place as Prescribed: Yes Patient Alerts: Type II Diabetic Has Footwear/Offloading in Place as Yes 11/30/2014 ABI: Prescribed: (L) 1.24 (R) Left: Surgical Shoe with 0.92 Pressure Relief Insole Pain Present Now: No Electronic Signature(s) Signed: 03/06/2015 3:56:45 PM By: Dayton Martes RCP, RRT, CHT Entered By: Weyman Rodney, Lucio Edward on 03/05/2015 09:50:44 Goettl, Rosezella [name] ([id number]) -------------------------------------------------------------------------------- Encounter Discharge Information Details Patient Name: [name], Rosezella [name]. 03/05/2015 10:00 Date of Service: AM Medical Record [id number] Number: Patient Account Number: [id number] 01-31-1976 (39 y.o. Treating RN: Date of Birth/Sex: Female) Other Clinician: [name] Primary Care Physician: [name] Treating [name] Referring Physician: [name] Physician/Extender: [name] in Treatment:  13 Encounter Discharge Information Items Discharge Pain Level: 0 Discharge Condition: Stable Ambulatory Status: Ambulatory Discharge Destination: Home Private Transportation: Auto Accompanied By: self Schedule Follow-up Appointment: No Medication Reconciliation completed and No provided to Patient/Care Maleko Greulich: Clinical Summary of Care: Notes Patient has an HBO treatment scheduled on 03/06/15 at 10:00 am. Electronic Signature(s) Signed: 03/06/2015 3:56:45 PM By: Dayton Martes RCP, RRT, CHT Entered By: Weyman Rodney, Lucio Edward on 03/05/2015 11:43:07 Shands, Rosezella [name] ([id number]) -------------------------------------------------------------------------------- Vitals Details Patient Name: [name] 03/05/2015 10:00 Date of Service: AM Medical Record [id number] Number: Patient Account Number: [id number] July 25, 1975 (39 y.o. Treating RN: Date of Birth/Sex: Female) Other Clinician: [name] Primary Care Physician: [name] Treating Britto, Errol Referring Physician: [name] Physician/Extender: [name] in Treatment: 13 Vital Signs Time Taken: 09:23 Temperature (F): 97.7 Height (in): 67 Pulse (bpm): 84 Weight (lbs): 290 Respiratory Rate (breaths/min): 18 Body Mass Index (BMI): 45.4 Blood Pressure (mmHg): 122/76 Capillary Blood Glucose (mg/dl): 536 Reference Range: 80 - 120 mg / dl Electronic Signature(s) Signed: 03/06/2015 3:56:45 PM By: Dayton Martes RCP, RRT, CHT Entered By: Weyman Rodney, Lucio Edward on 03/05/2015 09:51:08

## 2015-03-06 NOTE — Progress Notes (Signed)
[name], [name] ([id number]) Visit Report for 03/05/2015 HBO Details Patient Name: [name], [name] 03/05/2015 10:00 Date of Service: AM Medical Record [id number] Number: Patient Account Number: [id number] 1976-03-23 (39 y.o. Treating RN: Date of Birth/Sex: Female) Other Clinician: [name] Primary Care Physician: [name] Treating Britto, Errol Referring Physician: [name] Physician/Extender: [name] in Treatment: 13 HBO Treatment Course Details Treatment Course Ordering Physician: [name] 1 Number: HBO Treatment Start Date: 02/19/2015 Total Treatments 30 Ordered: HBO Indication: Diabetic Ulcer(s) of the Lower Extremity HBO Treatment Details Treatment Number: 9 Patient Type: Outpatient Chamber Type: Monoplace Chamber #: [id number] Treatment Protocol: 2.0 ATA with 90 minutes oxygen, and no air breaks Treatment Details Compression Rate Down: 1.5 psi / minute De-Compression Rate Up: 1.5 psi / minute Air breaks and breathing Compress Tx Pressure Decompress Decompress periods Begins Reached Begins Ends (leave unused spaces blank) Chamber Pressure 1 ATA 2.0 ATA - - - - - - 2.0 ATA 1 ATA Clock Time (24 hr) 09:34 09:46 - - - - - - 11:16 11:26 Treatment Length: 112 (minutes) Treatment Segments: 4 Capillary Blood Glucose Pre Capillary Blood Glucose (mg/dl): Post Capillary Blood Glucose (mg/dl): Vital Signs Capillary Blood Glucose Reference Range: 80 - 120 mg / dl HBO Diabetic Blood Glucose Intervention Range: <131 mg/dl or >865 mg/dl Time Vitals Blood Respiratory Capillary Blood Glucose Pulse Action Type: Pulse: Temperature: Taken: Pressure: Rate: Glucose (mg/dl): Meter #: Oximetry (%) Taken: Pre 09:23 122/76 84 18 97.7 155 1 none Post 11:30 120/80 72 18 97.6 99 1 released per MD to go eat lunch Treatment Response [place], [name] ([id number]) Treatment Completion Status: Treatment Completed without Adverse Event Electronic  Signature(s) Signed: 03/05/2015 4:01:57 PM By: [name] MD, FACS Signed: 03/06/2015 3:56:45 PM By: Dayton Martes RCP, RRT, CHT Previous Signature: 03/05/2015 11:29:56 AM Version By: [name] MD, FACS Entered By: Dayton Martes on 03/05/2015 11:42:19 Mallozzi, [name] ([id number]) -------------------------------------------------------------------------------- HBO Safety Checklist Details Patient Name: [name], [name]. 03/05/2015 10:00 Date of Service: AM Medical Record [id number] Number: Patient Account Number: [id number] 07/06/1975 (39 y.o. Treating RN: Date of Birth/Sex: Female) Other Clinician: [name] Primary Care Physician: [name] Treating Britto, Errol Referring Physician: [name] Physician/Extender: [name] in Treatment: 13 HBO Safety Checklist Items Safety Checklist Consent Form Signed Patient voided / foley secured and emptied When did you last eato 08:00 am Last dose of injectable or oral agent 08:00 am NA Ostomy pouch emptied and vented if applicable NA All implantable devices assessed, documented and approved NA Intravenous access site secured and place Valuables secured Linens and cotton and cotton/polyester blend (less than 51% polyester) Personal oil-based products / skin lotions / body lotions removed Wigs or hairpieces removed Smoking or tobacco materials removed Books / newspapers / magazines / loose paper removed Cologne, aftershave, perfume and deodorant removed Jewelry removed (may wrap wedding band) Make-up removed Hair care products removed Battery operated devices (external) removed Heating patches and chemical warmers removed NA Titanium eyewear removed Nail polish cured greater than 10 hours NA Casting material cured greater than 10 hours NA Hearing aids removed NA Loose dentures or partials removed NA Prosthetics have been removed Patient demonstrates correct use of air break  device (if applicable) Patient concerns have been addressed Patient grounding bracelet on and cord attached to chamber Specifics for Inpatients (complete in addition to above) Medication sheet sent with patient JESSAH, DANSER. ([id number]) Intravenous medications needed or due during therapy sent with patient Drainage tubes (e.g. nasogastric tube or chest  tube secured and vented) Endotracheal or Tracheotomy tube secured Cuff deflated of air and inflated with saline Airway suctioned Electronic Signature(s) Signed: 03/06/2015 3:56:45 PM By: Dayton Martes RCP, RRT, CHT Entered By: Weyman Rodney, Lucio Edward on 03/05/2015 09:51:49

## 2015-03-07 ENCOUNTER — Encounter: Payer: Medicaid Other | Admitting: Surgery

## 2015-03-07 DIAGNOSIS — L97513 Non-pressure chronic ulcer of other part of right foot with necrosis of muscle: Secondary | ICD-10-CM | POA: Diagnosis not present

## 2015-03-07 LAB — GLUCOSE, CAPILLARY
GLUCOSE-CAPILLARY: 169 mg/dL — AB (ref 65–99)
GLUCOSE-CAPILLARY: 129 mg/dL — AB (ref 65–99)

## 2015-03-07 NOTE — Progress Notes (Signed)
VONCILLE, SIMM ([id number]) Visit Report for 03/07/2015 Arrival Information Details Patient Name: [name], [name] Date of Service: 03/07/2015 10:00 AM Medical Record Patient Account Number: [id number] Number: Treating RN: [birth year] (39 y.o. Other Clinician: [name] Date of Birth/Sex: Female) Treating BURNS III, Primary Care Physician: [name] Physician/Extender: Zollie Beckers Referring Physician: [name] Weeks in Treatment: 13 Visit Information History Since Last Visit Added or deleted any medications: No Patient Arrived: Ambulatory Any new allergies or adverse reactions: No Arrival Time: 09:20 Had a fall or experienced change in No Accompanied By: self activities of daily living that may affect Transfer Assistance: None risk of falls: Patient Identification Verified: Yes Signs or symptoms of abuse/neglect No Secondary Verification Process Yes since last visito Completed: Hospitalized since last visit: No Patient Has Alerts: Yes Has Dressing in Place as Prescribed: Yes Patient Alerts: Type II Diabetic Has Footwear/Offloading in Place as Yes 11/30/2014 ABI: Prescribed: (L) 1.24 (R) Left: Surgical Shoe with 0.92 Pressure Relief Insole Right: Surgical Shoe with Pressure Relief Insole Pain Present Now: No Electronic Signature(s) Signed: 03/07/2015 3:52:54 PM By: Dayton Martes RCP, RRT, CHT Entered By: Weyman Rodney, Lucio Edward on 03/07/2015 09:29:06 [name] -------------------------------------------------------------------------------- Encounter Discharge Information Details Patient Name: [name], [name] Date of Service: 03/07/2015 10:00 AM Medical Record Patient Account Number: [id number] Number: Treating RN: [birth year] (39 y.o. Other Clinician: [name] Date of Birth/Sex: Female) Treating BURNS III, Primary Care Physician: [name] Physician/Extender:  Zollie Beckers Referring Physician: [name] Weeks in Treatment: 42 Encounter Discharge Information Items Discharge Pain Level: 0 Discharge Condition: Stable Ambulatory Status: Ambulatory Discharge Destination: Home Private Transportation: Auto Accompanied By: self Schedule Follow-up Appointment: No Medication Reconciliation completed and No provided to Patient/Care Brenda Cowher: Clinical Summary of Care: Notes Patient has an HBO treatment scheduled on 03/08/15 at 10:00 am. Electronic Signature(s) Signed: 03/07/2015 3:52:54 PM By: Dayton Martes RCP, RRT, CHT Entered By: Weyman Rodney, Lucio Edward on 03/07/2015 11:43:20 [name] ([id number]) -------------------------------------------------------------------------------- Vitals Details Patient Name: [name], [name] Date of Service: 03/07/2015 10:00 AM Medical Record Patient Account Number: [id number] Number: Treating RN: [birth year] (39 y.o. Other Clinician: [name] Date of Birth/Sex: Female) Treating BURNS III, Primary Care Physician: [name] Physician/Extender: Zollie Beckers Referring Physician: [name] Weeks in Treatment: 13 Vital Signs Time Taken: 09:20 Temperature (F): 97.7 Height (in): 67 Pulse (bpm): 78 Weight (lbs): 290 Respiratory Rate (breaths/min): 18 Body Mass Index (BMI): 45.4 Blood Pressure (mmHg): 146/80 Capillary Blood Glucose (mg/dl): 629 Reference Range: 80 - 120 mg / dl Electronic Signature(s) Signed: 03/07/2015 3:52:54 PM By: Dayton Martes RCP, RRT, CHT Entered By: Weyman Rodney, Lucio Edward on 03/07/2015 52:84:13

## 2015-03-08 ENCOUNTER — Encounter: Payer: Medicaid Other | Admitting: Surgery

## 2015-03-08 DIAGNOSIS — L97513 Non-pressure chronic ulcer of other part of right foot with necrosis of muscle: Secondary | ICD-10-CM | POA: Diagnosis not present

## 2015-03-08 LAB — GLUCOSE, CAPILLARY
GLUCOSE-CAPILLARY: 100 mg/dL — AB (ref 65–99)
GLUCOSE-CAPILLARY: 137 mg/dL — AB (ref 65–99)
Glucose-Capillary: 146 mg/dL — ABNORMAL HIGH (ref 65–99)

## 2015-03-08 NOTE — Progress Notes (Signed)
SHAVELL, NORED ([id number]) Visit Report for 03/07/2015 HBO Details Patient Name: [name] [name] RIEGE. Date of Service: 03/07/2015 10:00 AM Medical Record Patient Account Number: [id number] Number: Treating RN: 1976-04-21 (39 y.o. Other Clinician: [name] Date of Birth/Sex: Female) Treating BURNS III, Primary Care Physician: [name] Physician/Extender: Zollie Beckers Referring Physician: [name] Weeks in Treatment: 13 HBO Treatment Course Details Treatment Course Ordering Physician: [name] 1 Number: HBO Treatment Start Date: 02/19/2015 Total Treatments 30 Ordered: HBO Indication: Diabetic Ulcer(s) of the Lower Extremity HBO Treatment Details Treatment Number: 10 Patient Type: Outpatient Chamber Type: Monoplace Chamber #: [id number] Treatment Protocol: 2.0 ATA with 90 minutes oxygen, and no air breaks Treatment Details Compression Rate Down: 1.5 psi / minute De-Compression Rate Up: 1.5 psi / minute Air breaks and breathing Compress Tx Pressure Decompress Decompress periods Begins Reached Begins Ends (leave unused spaces blank) Chamber Pressure 1 ATA 2.0 ATA - - - - - - 2.0 ATA 1 ATA Clock Time (24 hr) 09:39 09:51 - - - - - - 11:21 11:32 Treatment Length: 113 (minutes) Treatment Segments: 4 Capillary Blood Glucose Pre Capillary Blood Glucose (mg/dl): Post Capillary Blood Glucose (mg/dl): Vital Signs Capillary Blood Glucose Reference Range: 80 - 120 mg / dl HBO Diabetic Blood Glucose Intervention Range: <131 mg/dl or >621 mg/dl Time Vitals Blood Respiratory Capillary Blood Glucose Pulse Action Type: Pulse: Temperature: Taken: Pressure: Rate: Glucose (mg/dl): Meter #: Oximetry (%) Taken: Pre 09:20 146/80 78 18 97.7 169 1 none Post 11:35 130/70 66 18 97.9 129 1 none Treatment Response [name] [name] M. ([id number]) Treatment Completion Status: Treatment Completed without Adverse Event HBO Attestation I certify that I  supervised this HBO treatment in accordance with Medicare guidelines. A trained Yes emergency response team is readily available per hospital policies and procedures. Continue HBOT as ordered. Yes Electronic Signature(s) Signed: 03/07/2015 4:56:52 PM By: Madelaine Bhat MD Previous Signature: 03/07/2015 10:32:18 AM Version By: Madelaine Bhat MD Entered By: Madelaine Bhat on 03/07/2015 12:08:12 [name] [name] ([id number]) -------------------------------------------------------------------------------- HBO Safety Checklist Details Patient Name: [name] [name] M. Date of Service: 03/07/2015 10:00 AM Medical Record Patient Account Number: [id number] Number: Treating RN: 25-Jan-1976 (39 y.o. Other Clinician: [name] Date of Birth/Sex: Female) Treating BURNS III, Primary Care Physician: [name] Physician/Extender: Zollie Beckers Referring Physician: [name] Weeks in Treatment: 13 HBO Safety Checklist Items Safety Checklist Consent Form Signed Patient voided / foley secured and emptied When did you last eato 19:00 pm on 03/06/15 Last dose of injectable or oral agent 19:00 pm on 03/06/15 NA Ostomy pouch emptied and vented if applicable NA All implantable devices assessed, documented and approved NA Intravenous access site secured and place Valuables secured Linens and cotton and cotton/polyester blend (less than 51% polyester) Personal oil-based products / skin lotions / body lotions removed Wigs or hairpieces removed Smoking or tobacco materials removed Books / newspapers / magazines / loose paper removed Cologne, aftershave, perfume and deodorant removed Jewelry removed (may wrap wedding band) Make-up removed Hair care products removed Battery operated devices (external) removed Heating patches and chemical warmers removed NA Titanium eyewear removed Nail polish cured greater than 10 hours NA Casting material cured greater than 10  hours NA Hearing aids removed NA Loose dentures or partials removed NA Prosthetics have been removed Patient demonstrates correct use of air break device (if applicable) Patient concerns have been addressed Patient grounding bracelet on and cord attached to chamber Specifics for Inpatients (complete in addition to above) Medication sheet sent  with patient [name] [name], FENCL. ([id number]) Intravenous medications needed or due during therapy sent with patient Drainage tubes (e.g. nasogastric tube or chest tube secured and vented) Endotracheal or Tracheotomy tube secured Cuff deflated of air and inflated with saline Airway suctioned Electronic Signature(s) Signed: 03/07/2015 3:52:54 PM By: Dayton Martes RCP, RRT, CHT Entered By: Dayton Martes on 03/07/2015 09:30:23

## 2015-03-08 NOTE — Progress Notes (Signed)
[name], [name] ([id number]) Visit Report for 03/08/2015 HBO Details Patient Name: [name], [name] 03/08/2015 10:00 Date of Service: AM Medical Record [id number] Number: Patient Account Number: [id number] February 15, 1976 (39 y.o. Treating RN: Date of Birth/Sex: Female) Other Clinician: [name] Primary Care Physician: [name] Treating Britto, Errol Referring Physician: [name] Physician/Extender: [name] in Treatment: 14 HBO Treatment Course Details Treatment Course Ordering Physician: [name] 1 Number: HBO Treatment Start Date: 02/19/2015 Total Treatments 30 Ordered: HBO Indication: Diabetic Ulcer(s) of the Lower Extremity HBO Treatment Details Treatment Number: 11 Patient Type: Inpatient Chamber Type: Monoplace Chamber #: [id number] Treatment Protocol: 2.0 ATA with 90 minutes oxygen, and no air breaks Treatment Details Compression Rate Down: 1.5 psi / minute De-Compression Rate Up: 1.5 psi / minute Air breaks and breathing Compress Tx Pressure Decompress Decompress periods Begins Reached Begins Ends (leave unused spaces blank) Chamber Pressure 1 ATA 2.0 ATA - - - - - - 2.0 ATA 1 ATA Clock Time (24 hr) 10:11 10:23 - - - - - - 11:54 12:05 Treatment Length: 114 (minutes) Treatment Segments: 4 Capillary Blood Glucose Pre Capillary Blood Glucose (mg/dl): Post Capillary Blood Glucose (mg/dl): Vital Signs Capillary Blood Glucose Reference Range: 80 - 120 mg / dl HBO Diabetic Blood Glucose Intervention Range: <131 mg/dl or >865 mg/dl Capillary Time Pulse Blood Respiratory Blood Glucose Action Type: Vitals Pulse: Temperature: Oximetry Pressure: Rate: Glucose Meter #: Taken: Taken: (%) (mg/dl): patient given juice and peanut butter Pre 09:38 132/80 102 18 96.9 100 1 crackers Pre 10:09 137 1 none Post 12:09 142/94 72 18 97.8 146 1 none Weitzman, Aubriegh M. ([id number]) Treatment Response Treatment Completion Status: Treatment  Completed without Adverse Event HBO Attestation I certify that I supervised this HBO treatment in accordance with Medicare guidelines. A trained Yes emergency response team is readily available per hospital policies and procedures. Continue HBOT as ordered. Yes Electronic Signature(s) Signed: 03/08/2015 12:13:27 PM By: [name] MD, FACS Entered By: [name] on 03/08/2015 12:13:27 Vinciguerra, [name] ([id number]) -------------------------------------------------------------------------------- HBO Safety Checklist Details Patient Name: [name], [name]. 03/08/2015 10:00 Date of Service: AM Medical Record [id number] Number: Patient Account Number: [id number] 09-08-1975 (39 y.o. Treating RN: Date of Birth/Sex: Female) Other Clinician: [name] Primary Care Physician: [name] Treating Britto, Errol Referring Physician: [name] Physician/Extender: [name] in Treatment: 14 HBO Safety Checklist Items Safety Checklist Consent Form Signed Patient voided / foley secured and emptied When did you last eato 19:00 pm on 03/07/15 Last dose of injectable or oral agent 19:00 pm on 03/07/15 NA Ostomy pouch emptied and vented if applicable NA All implantable devices assessed, documented and approved NA Intravenous access site secured and place Valuables secured Linens and cotton and cotton/polyester blend (less than 51% polyester) Personal oil-based products / skin lotions / body lotions removed Wigs or hairpieces removed Smoking or tobacco materials removed Books / newspapers / magazines / loose paper removed Cologne, aftershave, perfume and deodorant removed Jewelry removed (may wrap wedding band) Make-up removed Hair care products removed Battery operated devices (external) removed Heating patches and chemical warmers removed NA Titanium eyewear removed Nail polish cured greater than 10 hours NA Casting material cured greater than 10 hours NA Hearing aids  removed NA Loose dentures or partials removed NA Prosthetics have been removed Patient demonstrates correct use of air break device (if applicable) Patient concerns have been addressed Patient grounding bracelet on and cord attached to chamber Specifics for Inpatients (complete in addition to above) Medication sheet sent with patient Luse,  [name] ([id number]) Intravenous medications needed or due during therapy sent with patient Drainage tubes (e.g. nasogastric tube or chest tube secured and vented) Endotracheal or Tracheotomy tube secured Cuff deflated of air and inflated with saline Airway suctioned Electronic Signature(s) Signed: 03/08/2015 3:24:36 PM By: Dayton Martes RCP, RRT, CHT Entered By: Weyman Rodney, Lucio Edward on 03/08/2015 10:00:22

## 2015-03-08 NOTE — Progress Notes (Signed)
[name] ([id number]) Visit Report for 03/08/2015 Arrival Information Details Patient Name: [name], [name] 03/08/2015 10:00 Date of Service: AM Medical Record [id number] Number: Patient Account Number: [id number] 26-Jan-1976 (39 y.o. Treating RN: Date of Birth/Sex: Female) Other Clinician: [name] Primary Care Physician: [name] Treating Britto, Errol Referring Physician: [name] Physician/Extender: [name] in Treatment: 14 Visit Information History Since Last Visit Added or deleted any medications: No Patient Arrived: Ambulatory Any new allergies or adverse reactions: No Arrival Time: 09:35 Had a fall or experienced change in No Accompanied By: self activities of daily living that may affect Transfer Assistance: None risk of falls: Patient Identification Verified: Yes Signs or symptoms of abuse/neglect No Secondary Verification Process Yes since last visito Completed: Hospitalized since last visit: No Patient Has Alerts: Yes Has Dressing in Place as Prescribed: Yes Patient Alerts: Type II Diabetic Has Footwear/Offloading in Place as Yes 11/30/2014 ABI: Prescribed: (L) 1.24 (R) Left: Surgical Shoe with 0.92 Pressure Relief Insole Right: Surgical Shoe with Pressure Relief Insole Pain Present Now: No Electronic Signature(s) Signed: 03/08/2015 3:24:36 PM By: Dayton Martes RCP, RRT, CHT Entered By: Dayton Martes on 03/08/2015 09:58:48 Boney, [name] -------------------------------------------------------------------------------- Encounter Discharge Information Details Patient Name: [name], [name] 03/08/2015 10:00 Date of Service: AM Medical Record [id number] Number: Patient Account Number: [id number] 1976/06/12 (39 y.o. Treating RN: Date of Birth/Sex: Female) Other Clinician: [name] Primary Care Physician: [name] Treating [name] Referring Physician:  [name] Physician/Extender: [name] in Treatment: 14 Encounter Discharge Information Items Discharge Pain Level: 0 Discharge Condition: Stable Ambulatory Status: Ambulatory Discharge Destination: Home Private Transportation: Auto Accompanied By: self Schedule Follow-up Appointment: No Medication Reconciliation completed and No provided to Patient/Care Dickey Caamano: Clinical Summary of Care: Notes Patient has an HBO treatment scheduled on 03/09/15 at 10:00 am. Electronic Signature(s) Signed: 03/08/2015 3:24:36 PM By: Dayton Martes RCP, RRT, CHT Entered By: Weyman Rodney, Lucio Edward on 03/08/2015 12:14:02 [place], [name] -------------------------------------------------------------------------------- Vitals Details Patient Name: [name] 03/08/2015 10:00 Date of Service: AM Medical Record [id number] Number: Patient Account Number: [id number] Jun 25, 1975 (39 y.o. Treating RN: Date of Birth/Sex: Female) Other Clinician: [name] Primary Care Physician: [name] Treating Britto, Errol Referring Physician: [name] Physician/Extender: [name] in Treatment: 14 Vital Signs Time Taken: 09:38 Temperature (F): 96.9 Height (in): 67 Pulse (bpm): 102 Weight (lbs): 290 Respiratory Rate (breaths/min): 18 Body Mass Index (BMI): 45.4 Blood Pressure (mmHg): 132/80 Capillary Blood Glucose (mg/dl): 536 Reference Range: 80 - 120 mg / dl Electronic Signature(s) Signed: 03/08/2015 3:24:36 PM By: Dayton Martes RCP, RRT, CHT Entered By: Weyman Rodney, Lucio Edward on 03/08/2015 09:59:12

## 2015-03-09 ENCOUNTER — Encounter: Payer: Medicaid Other | Admitting: Surgery

## 2015-03-09 DIAGNOSIS — L97513 Non-pressure chronic ulcer of other part of right foot with necrosis of muscle: Secondary | ICD-10-CM | POA: Diagnosis not present

## 2015-03-09 LAB — GLUCOSE, CAPILLARY
GLUCOSE-CAPILLARY: 155 mg/dL — AB (ref 65–99)
GLUCOSE-CAPILLARY: 146 mg/dL — AB (ref 65–99)

## 2015-03-09 NOTE — Progress Notes (Signed)
[name], [name] ([id number]) Visit Report for 03/09/2015 HBO Details Patient Name: [name], [name] 03/09/2015 10:00 Date of Service: AM Medical Record [id number] Number: Patient Account Number: [id number] Nov 11, 1975 (39 y.o. Treating RN: Date of Birth/Sex: Female) Other Clinician: [name] Primary Care Physician: [name] Treating Britto, Errol Referring Physician: [name] Physician/Extender: [name] in Treatment: 14 HBO Treatment Course Details Treatment Course Ordering Physician: [name] 1 Number: HBO Treatment Start Date: 02/19/2015 Total Treatments 30 Ordered: HBO Indication: Diabetic Ulcer(s) of the Lower Extremity HBO Treatment Details Treatment Number: 12 Patient Type: Outpatient Chamber Type: Monoplace Chamber #: [id number] Treatment Protocol: 2.0 ATA with 90 minutes oxygen, and no air breaks Treatment Details Compression Rate Down: 1.5 psi / minute De-Compression Rate Up: 1.5 psi / minute Air breaks and breathing Compress Tx Pressure Decompress Decompress periods Begins Reached Begins Ends (leave unused spaces blank) Chamber Pressure 1 ATA 2.0 ATA - - - - - - 2.0 ATA 1 ATA Clock Time (24 hr) 09:10 09:21 - - - - - - 10:51 11:03 Treatment Length: 113 (minutes) Treatment Segments: 4 Capillary Blood Glucose Pre Capillary Blood Glucose (mg/dl): Post Capillary Blood Glucose (mg/dl): Vital Signs Capillary Blood Glucose Reference Range: 80 - 120 mg / dl HBO Diabetic Blood Glucose Intervention Range: <131 mg/dl or >865 mg/dl Time Vitals Blood Respiratory Capillary Blood Glucose Pulse Action Type: Pulse: Temperature: Taken: Pressure: Rate: Glucose (mg/dl): Meter #: Oximetry (%) Taken: Pre 09:01 122/82 90 18 97.9 155 1 none Post 11:07 130/94 84 18 98.5 146 1 none Treatment Response [name], [name] ([id number]) Treatment Completion Status: Treatment Completed without Adverse Event HBO Attestation I certify that I  supervised this HBO treatment in accordance with Medicare guidelines. A trained Yes emergency response team is readily available per hospital policies and procedures. Continue HBOT as ordered. Yes Electronic Signature(s) Signed: 03/09/2015 11:53:32 AM By: [name] MD, FACS Entered By: [name] on 03/09/2015 11:53:31 [name], [name] ([id number]) -------------------------------------------------------------------------------- HBO Safety Checklist Details Patient Name: [name], [name] 03/09/2015 10:00 Date of Service: AM Medical Record [id number] Number: Patient Account Number: [id number] April 06, 1976 (39 y.o. Treating RN: Date of Birth/Sex: Female) Other Clinician: [name] Primary Care Physician: [name] Treating Britto, Errol Referring Physician: [name] Physician/Extender: [name] in Treatment: 14 HBO Safety Checklist Items Safety Checklist Consent Form Signed Patient voided / foley secured and emptied When did you last eato 07:30 am Last dose of injectable or oral agent 19:00 pm on 03/08/15 NA Ostomy pouch emptied and vented if applicable NA All implantable devices assessed, documented and approved NA Intravenous access site secured and place Valuables secured Linens and cotton and cotton/polyester blend (less than 51% polyester) Personal oil-based products / skin lotions / body lotions removed Wigs or hairpieces removed Smoking or tobacco materials removed Books / newspapers / magazines / loose paper removed Cologne, aftershave, perfume and deodorant removed Jewelry removed (may wrap wedding band) Make-up removed Hair care products removed Battery operated devices (external) removed Heating patches and chemical warmers removed NA Titanium eyewear removed Nail polish cured greater than 10 hours NA Casting material cured greater than 10 hours NA Hearing aids removed NA Loose dentures or partials removed NA Prosthetics have been  removed Patient demonstrates correct use of air break device (if applicable) Patient concerns have been addressed Patient grounding bracelet on and cord attached to chamber Specifics for Inpatients (complete in addition to above) Medication sheet sent with patient [name], PINCOCK. ([id number]) Intravenous medications needed or due during therapy sent with patient  Drainage tubes (e.g. nasogastric tube or chest tube secured and vented) Endotracheal or Tracheotomy tube secured Cuff deflated of air and inflated with saline Airway suctioned Electronic Signature(s) Signed: 03/09/2015 2:33:58 PM By: Dayton Martes RCP, RRT, CHT Entered By: Weyman Rodney, Lucio Edward on 03/09/2015 09:23:21

## 2015-03-09 NOTE — Progress Notes (Signed)
[name], [name] ([id number]) Visit Report for 03/09/2015 Arrival Information Details Patient Name: [name], [name] 03/09/2015 10:00 Date of Service: AM Medical Record [id number] Number: Patient Account Number: [id number] [birth year] (39 y.o. Treating RN: Date of Birth/Sex: Female) Other Clinician: [name] Primary Care Physician: [name] Treating Britto, Errol Referring Physician: [name] Physician/Extender: [name] in Treatment: 14 Visit Information History Since Last Visit Added or deleted any medications: No Patient Arrived: Ambulatory Any new allergies or adverse reactions: No Arrival Time: 09:00 Had a fall or experienced change in No Accompanied By: self activities of daily living that may affect Transfer Assistance: None risk of falls: Patient Identification Verified: Yes Signs or symptoms of abuse/neglect No Secondary Verification Process Yes since last visito Completed: Hospitalized since last visit: No Patient Has Alerts: Yes Has Dressing in Place as Prescribed: Yes Patient Alerts: Type II Diabetic Has Footwear/Offloading in Place as Yes 11/30/2014 ABI: Prescribed: (L) 1.24 (R) Left: Surgical Shoe with 0.92 Pressure Relief Insole Right: Surgical Shoe with Pressure Relief Insole Pain Present Now: No Electronic Signature(s) Signed: 03/09/2015 2:33:58 PM By: Dayton Martes RCP, RRT, CHT Entered By: Weyman Rodney, Lucio Edward on 03/09/2015 09:20:49 [name] ([id number]) -------------------------------------------------------------------------------- Encounter Discharge Information Details Patient Name: [name], [name] 03/09/2015 10:00 Date of Service: AM Medical Record [id number] Number: Patient Account Number: [id number] March 02, 1976 (39 y.o. Treating RN: Date of Birth/Sex: Female) Other Clinician: [name] Primary Care Physician: [name] Treating [name] Referring Physician:  [name] Physician/Extender: [name] in Treatment: 14 Encounter Discharge Information Items Discharge Pain Level: 0 Discharge Condition: Stable Ambulatory Status: Ambulatory Discharge Destination: Home Private Transportation: Auto Accompanied By: self Schedule Follow-up Appointment: No Medication Reconciliation completed and No provided to Patient/Care Provider: Clinical Summary of Care: Notes Patient has an HBO treatment scheduled on 03/12/15 at 10:00 am. Electronic Signature(s) Signed: 03/09/2015 2:33:58 PM By: Dayton Martes RCP, RRT, CHT Entered By: Weyman Rodney, Lucio Edward on 03/09/2015 11:15:08 [name] ([id number]) -------------------------------------------------------------------------------- Vitals Details Patient Name: [name] 03/09/2015 10:00 Date of Service: AM Medical Record [id number] Number: Patient Account Number: [id number] 1975/10/14 (39 y.o. Treating RN: Date of Birth/Sex: Female) Other Clinician: [name] Primary Care Physician: [name] Treating Britto, Errol Referring Physician: [name] Physician/Extender: [name] in Treatment: 14 Vital Signs Time Taken: 09:01 Temperature (F): 97.9 Height (in): 67 Pulse (bpm): 90 Weight (lbs): 290 Respiratory Rate (breaths/min): 18 Body Mass Index (BMI): 45.4 Blood Pressure (mmHg): 122/82 Capillary Blood Glucose (mg/dl): 742 Reference Range: 80 - 120 mg / dl Electronic Signature(s) Signed: 03/09/2015 2:33:58 PM By: Dayton Martes RCP, RRT, CHT Entered By: Weyman Rodney, Lucio Edward on 03/09/2015 09:21:20

## 2015-03-10 NOTE — Progress Notes (Signed)
BILLI, BRIGHT ([id number]) Visit Report for 03/09/2015 Chief Complaint Document Details Patient Name: [name], [name] Date of Service: 03/09/2015 8:45 AM Medical Record Patient Account Number: [id number] Number: Afful, RN, BSN, Treating RN: [birth year] (39 y.o. [name] Date of Birth/Sex: Female) Other Clinician: Primary Care Physician: [name] Treating [name] Referring Physician: [name] Physician/Extender: [name] in Treatment: 14 Information Obtained from: Patient Chief Complaint Patients presents for treatment of an open diabetic ulcer. The patient has had an ulcerated wound on her left lower extremity for about 2 years. Electronic Signature(s) Signed: 03/09/2015 9:02:09 AM By: [name] MD, FACS Entered By: [name] on 03/09/2015 09:02:09 [name] -------------------------------------------------------------------------------- Debridement Details Patient Name: [name], [name] Date of Service: 03/09/2015 8:45 AM Medical Record Patient Account Number: [id number] Number: Afful, RN, BSN, Treating RN: [birth year] (39 y.o. [name] Date of Birth/Sex: Female) Other Clinician: Primary Care Physician: [name] Treating [place] Jagoda Referring Physician: [name] Physician/Extender: [name] in Treatment: 14 Debridement Performed for Wound #1 Left,Lateral,Plantar Foot Assessment: Performed By: Physician Tristan Schroeder., MD Debridement: Debridement Pre-procedure Yes Verification/Time Out Taken: Start Time: 08:49 Pain Control: Lidocaine 4% Topical Solution Level: Skin/Subcutaneous Tissue/Muscle Total Area Debrided (L x 1 (cm) x 0.1 (cm) = 0.1 (cm) W): Tissue and other Viable, Non-Viable, Callus, Fibrin/Slough, Skin, Subcutaneous material debrided: Instrument: Forceps, Scissors Bleeding: Minimum Hemostasis Achieved: Pressure End Time: 08:51 Procedural Pain: 0 Post Procedural Pain:  0 Response to Treatment: Procedure was tolerated well Post Debridement Measurements of Total Wound Length: (cm) 1 Width: (cm) 0.1 Depth: (cm) 3 Volume: (cm) 0.236 Post Procedure Diagnosis Same as Pre-procedure Electronic Signature(s) Signed: 03/09/2015 9:02:03 AM By: [name] MD, FACS Signed: 03/09/2015 4:40:49 PM By: Elpidio Eric BSN, RN Entered By: [name] on 03/09/2015 09:02:03 [name], [name] -------------------------------------------------------------------------------- HPI Details Patient Name: [name], [name] Date of Service: 03/09/2015 8:45 AM Medical Record Patient Account Number: [id number] Number: Afful, RN, BSN, Treating RN: [birth year] (39 y.o. [name] Date of Birth/Sex: Female) Other Clinician: Primary Care Physician: [name] Treating [name] Referring Physician: [name] Physician/Extender: Weeks in Treatment: 14 History of Present Illness Location: left foot ulcer Quality: Patient reports No Pain. Severity: Wound is moderately severed with some exposed structure in the wound bed. Duration: Patient has had the wound for > 2 years prior to seeking treatment at the wound center Timing: for the last 2 years she has been treated at the [facility] wound care center Context: The wound appeared gradually over time Modifying Factors: Other treatment(s) tried include:in the last month she has been operated on the left foot for an abscess of that foot and was in hospital for 2 weeks. Associated Signs and Symptoms: Periwound excoriated and wound draining heavily HPI Description: 39 year old patient who is known to be a diabetic comes from the office of Dr. Dario Guardian for a ulcer on the left lower extremity and foot. She has had an ulcer on the left foot and has had previous treatment at [facility]. the patient tells [place] that she has been seen as the outpatient center for a long while and has had various tests including MRIs done  but she is not a very good historian. Most recently about a month ago she had a abscess drained from her left foot and was in hospital for about 2 weeks and was then discharged with a PICC line and IV antibioticstics which she knows may be vancomycin and Levaquin. She has never had a wound VAC now has  she had hyperbaric oxygen therapy. Past medical history significant for dyspnea, hypertension, hyperlipidemia, neuropathy, diabetes type 2 uncontrolled. She smokes cigarettes the latest blood reports are from March 2016 and show that her BMP is essentially normal her lipid panel was also normal her hemoglobin A1c was 9.4. 12/07/2014 -- Her tissue culture is back and she has grown significant amounts of Proteus mirabilis, pseudomonas aeruginosa and Staphylococcus aureus. The staph is a MSSA and all the cultures are sensitive to ciprofloxacin. I will start her on Cipro 500 mg by mouth twice a day for 14 days. Xray left foot -- IMPRESSION:No evidence of osteomyelitis. Healed fracture of the distal left third metatarsal. 12/21/2014 -- she is still awaiting clearance from her insurance company for HBOT. She was seen by Dr. Dario Guardian was working on controlling her diabetes and she continues to work on giving up smoking. She has completed her dosage of ciprofloxacin and we will be continuing with local care. 01/03/2015 -- her insurance clearance is still not in and we are awaiting this before starting her on a hyperbaric oxygen therapy. 01/09/2015 - we are still awaiting her insurance clearance and other than that she is doing fine. 01/26/2015 -- she was unable to tolerate the total contact cast and hence had a Darco shoe back on her left [name], [name] ([id number]) foot. Her blood sugars have been doing better and her last hemoglobin A1c report is pending. He continues to smoke 6-8 cigarettes a day. 02/02/2015 -- yesterday her left leg started getting swollen and has red streaks starting at the  foot and going up to her knee. She has had a mild fever and has not gone to see her PCP or [facility] where she normally gets her treatment for this. 02/15/2015 - she has recently been discharged from [facility] after she was admitted for a cellulitis abscess and osteomyelitis of her left foot. We haven't received all her reports yet but as per the patient she was on IV antibiotics for 10 days and then discharged home on Augmentin. Addendum -- was able to look at her records in Epic. Hospital Course: VALEEN BORYS is a 39 y.o. female with PMH of HTN, DM, depression, and tobacco abuse who presented with left diabetic foot ulcer and cellulitis. Left leg cellulitis/Left diabetic foot ulcer with osteomyelitis: The patient presented with worsening pain and green pus drainage from her diabetic foot ulcer and surrounding cellulitis. Most recently, she was hospitalized 4/12-4/19 for this foot wound. She was treated with Vancomycin and Zosyn. ID was consulted and recommended that the patient receive Daptomycin, Ceftazidime, and Flagyl x 2 weeks. She had a PICC line placed prior to discharge. After discharge, she was transitioned to oral antibiotics. Wound cultures have grown GAS, GBS, MSSA, and MRSA.Marland Kitchen She notes that she was last treated with Ciprofloxacin for about 2 weeks and completed this course at least 2 weeks ago. She continues to follow with her wound clinic in [place] weekly. - Obtained MRI LLE which showed osteomyelitis of the left fourth metatarsal head. IMPRESSION: Findings compatible with acute osteomyelitis of the left fourth metatarsal head, with question of nondisplaced pathologic fracture. Plantar ulcer underlying the fourth MTP joint and diffuse soft tissue edema are noted. - Consulted Vascular Surgery, appreciate assistance. Performed left foot ulcer/bone debridement 8/15, sent bone for culture which showed no growth to date. - Started IV Vancomycin, Ceftaz, and  Flagyl on admission. Transitioned to IV Unasyn 8/15. Given negative bone cultures, will transition to oral Augmentin  for 2 week course at discharge. - Provided wound care w/ wet to dry packing to left foot wound with Kerlix wrap, change daily. - Per Vascular Surgery, WBAT LLE with offloading shoe. - Provided pain control with Oxycodone PRN, although pain had improved considerably prior to discharge. - Plan for follow up with Wound Clinic in [place] within 1 week after discharge. Arterial Duplex also done -- Final Interpretationo-- Left: Normal examination. No evidence of arterial occlusive disease.o LT Great toe pressure = 82 mmHg. No significant change asoocompared to previous study.o 02/23/2015 -- he has not Been able to see Dr. Sampson Goon and is running out of her Augmentin so we shall extend this. She had a stomach bug yesterday and could not make it to HBO. Continues to smoke but is working on quitting. 03/02/2015 -- she was seen by Dr. Sampson Goon yesterday and he has put on doxycycline and ciprofloxacin for 3 weeks. She continues with hyperbaric oxygen therapy and has been doing fine. Electronic Signature(s) Signed: 03/09/2015 9:02:14 AM By: [name] MD, FACS [place]auvelt, [name] ([id number]) Entered By: [name] on 03/09/2015 09:02:14 Manthei, [name] -------------------------------------------------------------------------------- Physical Exam Details Patient Name: Wilemon, [name] Date of Service: 03/09/2015 8:45 AM Medical Record Patient Account Number: [id number] Number: Afful, RN, BSN, Treating RN: 05-19-1976 (39 y.o. [place] [name] Date of Birth/Sex: Female) Other Clinician: Primary Care Physician: [name] Treating [name] Referring Physician: [name] Physician/Extender: Weeks in Treatment: 14 Constitutional . Pulse regular. Respirations normal and unlabored. Afebrile. . Eyes Nonicteric. Reactive to light. Ears, Nose,  Mouth, and Throat Lips, teeth, and gums WNL.Marland Kitchen Moist mucosa without lesions . Neck supple and nontender. No palpable supraclavicular or cervical adenopathy. Normal sized without goiter. Respiratory WNL. No retractions.. Cardiovascular Pedal Pulses WNL. No clubbing, cyanosis or edema. Lymphatic No adneopathy. No adenopathy. No adenopathy. Musculoskeletal Adexa without tenderness or enlargement.. Digits and nails w/o clubbing, cyanosis, infection, petechiae, ischemia, or inflammatory conditions.. Integumentary (Hair, Skin) No suspicious lesions. No crepitus or fluctuance. No peri-wound warmth or erythema. No masses.Marland Kitchen Psychiatric Judgement and insight Intact.. No evidence of depression, anxiety, or agitation.. Notes the cellulitis has subsided completely and the ulcer on the plantar aspect has some callus and subcutaneous tissue which in each upper debridement to have access to the depths of this wound. Electronic Signature(s) Signed: 03/09/2015 9:02:50 AM By: [name] MD, FACS Entered By: [name] on 03/09/2015 09:02:49 Beamer, [name] -------------------------------------------------------------------------------- Physician Orders Details Patient Name: Killam, [name] Date of Service: 03/09/2015 8:45 AM Medical Record Patient Account Number: [id number] Number: Afful, RN, BSN, Treating RN: 03-Feb-1976 (39 y.o. [place] [name] Date of Birth/Sex: Female) Other Clinician: Primary Care Physician: [name] Treating [name] Referring Physician: [name] Physician/Extender: [name] in Treatment: 14 Verbal / Phone Orders: Yes Clinician: Afful, RN, BSN, [name] Read Back and Verified: Yes Diagnosis Coding Wound Cleansing Wound #1 Left,Lateral,Plantar Foot o Clean wound with Normal Saline. Anesthetic Wound #1 Left,Lateral,Plantar Foot o Topical Lidocaine 4% cream applied to wound bed prior to debridement Primary Wound Dressing Wound #1  Left,Lateral,Plantar Foot o Iodoform packing Gauze Secondary Dressing Wound #1 Left,Lateral,Plantar Foot o Boardered Foam Dressing Dressing Change Frequency Wound #1 Left,Lateral,Plantar Foot o Change dressing every other day. Follow-up Appointments Wound #1 Left,Lateral,Plantar Foot o Return Appointment in 1 week. Off-Loading Wound #1 Left,Lateral,Plantar Foot o Open toe surgical shoe to: Additional Orders / Instructions Wound #1 Left,Lateral,Plantar Foot o Stop Smoking o Increase protein intake. Decamp, [name] ([id number]) Hyperbaric Oxygen Therapy Wound #1 Left,Lateral,Plantar  Foot o Evaluate for HBO Therapy o Indication: - Osteomyelitis; Abscess o 2.0 ATA for 90 Minutes without Air Breaks o One treatment per day (delivered Monday through Friday unless otherwise specified in Special Instructions below): o Total # of Treatments: - 30-40 o Follow Hyperbaric Oxygen Glycemia Protocol Medications-please add to medication list. Wound #1 Left,Lateral,Plantar Foot o P.O. Antibiotics - Doxycycline and Cipro prescribed by Dr. Cresenciano Lick INTERVENTIONS PROTOCOL PRE-HBO GLYCEMIA INTERVENTIONS ACTION INTERVENTION Obtain pre-HBO capillary blood 1 glucose (ensure physician order is in chart). A. Notify HBO physician and await physician orders. 2 If result is 70 mg/dl or below: B. If the result meets the hospital definition of a critical result, follow hospital policy. A. Give patient an 8 ounce Glucerna Shake, an 8 ounce Ensure, or 8 ounces of a Glucerna/Ensure equivalent dietary supplement*. B. Wait 30 minutes. If result is 71 mg/dl to 161 mg/dl: C. Retest patientos capillary blood glucose (CBG). D. If result greater than or equal to 110 mg/dl, proceed with HBO. If result less than 110 mg/dl, notify HBO physician and consider holding HBO. If result is 131 mg/dl to 096 mg/dl: A. Proceed with HBO. A. Notify HBO physician  and await physician orders. B. It is recommended to hold HBO and do blood/urine ketone If result is 250 mg/dl or greater: testing. C. If the result meets the hospital definition of a critical result, follow hospital policy. POST-HBO GLYCEMIA INTERVENTIONS Achey, [name] ([id number]) ACTION INTERVENTION Obtain post HBO capillary blood 1 glucose (ensure physician order is in chart). A. Notify HBO physician and await physician orders. 2 If result is 70 mg/dl or below: B. If the result meets the hospital definition of a critical result, follow hospital policy. A. Give patient an 8 ounce Glucerna Shake, an 8 ounce Ensure, or 8 ounces of a Glucerna/Ensure equivalent dietary supplement*. B. Wait 15 minutes for symptoms of hypoglycemia (i.e. nervousness, anxiety, If result is 71 mg/dl to 914 mg/dl: sweating, chills, clamminess, irritability, confusion, tachycardia or dizziness). C. If patient asymptomatic, discharge patient. If patient symptomatic, repeat capillary blood glucose (CBG) and notify HBO physician. If result is 101 mg/dl to 782 mg/dl: A. Discharge patient. A. Notify HBO physician and await physician orders. B. It is recommended to do If result is 250 mg/dl or greater: blood/urine ketone testing. C. If the result meets the hospital definition of a critical result, follow hospital policy. *Juice or candies are NOT equivalent products. If patient refuses the Glucerna or Ensure, please consult the hospital dietitian for an appropriate substitute. Electronic Signature(s) Signed: 03/09/2015 4:40:49 PM By: Elpidio Eric BSN, RN Signed: 03/09/2015 4:45:33 PM By: [name] MD, FACS Entered By: Elpidio Eric on 03/09/2015 08:53:58 Telfair, [name] ([id number]) -------------------------------------------------------------------------------- Problem List Details Patient Name: Rolph, Alejandrina M. Date of Service: 03/09/2015 8:45 AM Medical Record Patient  Account Number: [id number] Number: Afful, RN, BSN, Treating RN: [birth year] (39 y.o. 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 ([id number]) -------------------------------------------------------------------------------- Progress Note Details Patient Name: Vicencio, Geeta M. Date of Service: 03/09/2015 8:45 AM Medical Record Patient Account Number: [id number] Number: Afful, RN, BSN, Treating RN: 05/12/1976 (39 y.o. [place] [name] Date of Birth/Sex: Female) Other Clinician: Primary Care Physician: [name] Treating [name] Referring Physician: [name] Physician/Extender: [name] in Treatment: 14 Subjective Chief Complaint Information obtained from Patient Patients presents for treatment of an open diabetic ulcer. The patient has had an ulcerated wound on her left lower extremity for about 2 years. History of Present Illness (HPI) The following HPI elements were  documented for the patient's wound: Location: left foot ulcer Quality: Patient reports No Pain. Severity: Wound is moderately severed with some exposed structure in the wound bed. Duration: Patient has had the wound for > 2 years prior to seeking treatment at the wound center Timing: for the last 2 years she has been treated at the [facility] wound care center Context: The wound appeared gradually over time Modifying Factors: Other treatment(s) tried include:in the last month she has been operated on the left foot for an abscess of that foot and was in hospital for 2 weeks. Associated Signs and Symptoms: Periwound excoriated and wound draining heavily 39 year old patient who is known to be a diabetic comes from the office of Dr. Dario Guardian for a ulcer on the left lower extremity and foot. She has had an ulcer on the left foot and has had previous treatment at [facility]. the patient tells [place] that she has been seen as the outpatient center for a long while and has had various tests including MRIs done but she is not a very good historian. Most recently about a month ago she had a abscess drained from her left foot and was in hospital for about 2 weeks and was then discharged with a PICC line and IV antibioticstics which she knows may be vancomycin and Levaquin. She has never had a wound VAC now has she had hyperbaric oxygen therapy. Past medical history significant for dyspnea, hypertension, hyperlipidemia, neuropathy, diabetes type 2 uncontrolled. She smokes cigarettes the latest blood reports are from March 2016 and show that her BMP is essentially normal her lipid panel was also normal her hemoglobin A1c was 9.4. 12/07/2014 -- Her tissue culture is back and she has grown significant amounts of Proteus mirabilis, pseudomonas aeruginosa and Staphylococcus aureus. The staph is a MSSA and all the cultures are sensitive to ciprofloxacin. I will start her on Cipro 500 mg by mouth twice a day for 14 days. Xray  left foot -- IMPRESSION:No evidence of osteomyelitis. Healed fracture of the distal left Cryderman, Helaina M. ([id number]) third metatarsal. 12/21/2014 -- she is still awaiting clearance from her insurance company for HBOT. She was seen by Dr. Dario Guardian was working on controlling her diabetes and she continues to work on giving up smoking. She has completed her dosage of ciprofloxacin and we will be continuing with local care. 01/03/2015 -- her insurance clearance is still not in and we are awaiting this before starting her on a hyperbaric oxygen therapy. 01/09/2015 - we are still awaiting her insurance clearance and other than that she is doing fine. 01/26/2015 -- she was unable to tolerate the total contact cast and hence had a Darco shoe back on her left foot. Her blood  sugars have been doing better and her last hemoglobin A1c report is pending. He continues to smoke 6-8 cigarettes a day. 02/02/2015 -- yesterday her left leg started getting swollen and has red streaks starting at the foot and going up to her knee. She has had a mild fever and has not gone to see her PCP or [facility] where she normally gets her treatment for this. 02/15/2015 - she has recently been discharged from [facility] after she was admitted for a cellulitis abscess and osteomyelitis of her left foot. We haven't received all her reports yet but as per the patient she was on IV antibiotics for 10 days and then discharged home on Augmentin. Addendum -- was able to look at her records in Epic. Hospital Course: JIMMIE DATTILIO is a 39 y.o. female with PMH of HTN, DM, depression, and tobacco abuse who presented with left diabetic foot ulcer and cellulitis. Left leg cellulitis/Left diabetic foot ulcer with osteomyelitis: The patient presented with worsening pain and green pus drainage from her diabetic foot ulcer and surrounding cellulitis. Most recently, she was hospitalized 4/12-4/19 for this foot wound. She was  treated with Vancomycin and Zosyn. ID was consulted and recommended that the patient receive Daptomycin, Ceftazidime, and Flagyl x 2 weeks. She had a PICC line placed prior to discharge. After discharge, she was transitioned to oral antibiotics. Wound cultures have grown GAS, GBS, MSSA, and MRSA.Marland Kitchen She notes that she was last treated with Ciprofloxacin for about 2 weeks and completed this course at least 2 weeks ago. She continues to follow with her wound clinic in [place] weekly. - Obtained MRI LLE which showed osteomyelitis of the left fourth metatarsal head. IMPRESSION: Findings compatible with acute osteomyelitis of the left fourth metatarsal head, with question of nondisplaced pathologic fracture. Plantar ulcer underlying the fourth MTP joint and diffuse soft tissue edema are noted. - Consulted Vascular Surgery, appreciate assistance. Performed left foot ulcer/bone debridement 8/15, sent bone for culture which showed no growth to date. - Started IV Vancomycin, Ceftaz, and Flagyl on admission. Transitioned to IV Unasyn 8/15. Given negative bone cultures, will transition to oral Augmentin for 2 week course at discharge. - Provided wound care w/ wet to dry packing to left foot wound with Kerlix wrap, change daily. - Per Vascular Surgery, WBAT LLE with offloading shoe. - Provided pain control with Oxycodone PRN, although pain had improved considerably prior to discharge. - Plan for follow up with Wound Clinic in [place] within 1 week after discharge. Arterial Duplex also done -- Final Interpretation -- Left: Normal examination. No evidence of arterial occlusive disease. LT Great toe pressure = 82 mmHg. No significant change as compared to previous study. MIRELLE, BISKUP ([id number]) 02/23/2015 -- he has not Been able to see Dr. Sampson Goon and is running out of her Augmentin so we shall extend this. She had a stomach bug yesterday and could not make it to HBO. Continues to smoke but  is working on quitting. 03/02/2015 -- she was seen by Dr. Sampson Goon yesterday and he has put on doxycycline and ciprofloxacin for 3 weeks. She continues with hyperbaric oxygen therapy and has been doing fine. Objective Constitutional Pulse regular. Respirations normal and unlabored. Afebrile. Vitals Time Taken: 8:39 AM, Height: 67 in, Weight: 290 lbs, BMI: 45.4, Temperature: 97.9 F, Pulse: 88 bpm, Respiratory Rate: 18 breaths/min, Blood Pressure: 140/87 mmHg. Eyes Nonicteric. Reactive to light. Ears, Nose, Mouth, and Throat Lips, teeth, and gums WNL.Marland Kitchen Moist mucosa without lesions .  Neck supple and nontender. No palpable supraclavicular or cervical adenopathy. Normal sized without goiter. Respiratory WNL. No retractions.. Cardiovascular Pedal Pulses WNL. No clubbing, cyanosis or edema. Lymphatic No adneopathy. No adenopathy. No adenopathy. Musculoskeletal Adexa without tenderness or enlargement.. Digits and nails w/o clubbing, cyanosis, infection, petechiae, ischemia, or inflammatory conditions.Marland Kitchen Psychiatric Judgement and insight Intact.. No evidence of depression, anxiety, or agitation.. General Notes: the cellulitis has subsided completely and the ulcer on the plantar aspect has some callus and subcutaneous tissue which in each upper debridement to have access to the depths of this wound. Integumentary (Hair, Skin) Strassman, Chalet M. ([id number]) No suspicious lesions. No crepitus or fluctuance. No peri-wound warmth or erythema. No masses.. Wound #1 status is Open. Original cause of wound was Gradually Appeared. The wound is located on the Left,Lateral,Plantar Foot. The wound measures 1cm length x 0.1cm width x 3cm depth; 0.079cm^2 area and 0.236cm^3 volume. There is bone exposed. There is no tunneling or undermining noted. There is a large amount of serosanguineous drainage noted. The wound margin is thickened. There is medium (34- 66%) pink granulation within the wound bed.  There is a small (1-33%) amount of necrotic tissue within the wound bed including Adherent Slough. The periwound skin appearance exhibited: Callus, Localized Edema, Moist. The periwound skin appearance did not exhibit: Crepitus, Excoriation, Fluctuance, Friable, Induration, Rash, Scarring, Dry/Scaly, Maceration, Atrophie Blanche, Cyanosis, Ecchymosis, Hemosiderin Staining, Mottled, Pallor, Rubor, Erythema. Periwound temperature was noted as No Abnormality. 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 She is also benefiting from hyperbaric oxygen therapy and we will continue this in addition to her oral antibiotics. I have again spoken to her about her smoking and she says she is going to try nicotine patches and work on this. She will come back on a regular basis for hyperbaric oxygen therapy. Procedures Wound #1 Wound #1 is a Diabetic Wound/Ulcer of the Lower Extremity located on the Left,Lateral,Plantar Foot . There was a Skin/Subcutaneous Tissue/Muscle Debridement ([id number]) debridement with total area of 0.1 sq cm performed by Aleeah Greeno, Ignacia Felling., MD. with the following instrument(s): Forceps and Scissors to remove Viable and Non-Viable tissue/material including Fibrin/Slough, Skin, Callus, and Subcutaneous after achieving pain control using Lidocaine 4% Topical Solution. A time out was  conducted prior to the start of the procedure. A Minimum amount of bleeding was controlled with Pressure. The procedure was tolerated Beal, Lorrain M. ([id number]) well with a pain level of 0 throughout and a pain level of 0 following the procedure. Post Debridement Measurements: 1cm length x 0.1cm width x 3cm depth; 0.236cm^3 volume. Post procedure Diagnosis Wound #1: Same as Pre-Procedure Plan Wound Cleansing: Wound #1 Left,Lateral,Plantar Foot: Clean wound with Normal Saline. Anesthetic: Wound #1 Left,Lateral,Plantar Foot: Topical Lidocaine 4% cream applied to wound bed prior to debridement Primary Wound Dressing: Wound #1 Left,Lateral,Plantar Foot: Iodoform packing Gauze Secondary Dressing: Wound #1 Left,Lateral,Plantar Foot: Boardered Foam Dressing Dressing Change Frequency: Wound #1 Left,Lateral,Plantar Foot: Change dressing every other day.  Follow-up Appointments: Wound #1 Left,Lateral,Plantar Foot: Return Appointment in 1 week. Off-Loading: Wound #1 Left,Lateral,Plantar Foot: Open toe surgical shoe to: Additional Orders / Instructions: Wound #1 Left,Lateral,Plantar Foot: Stop Smoking Increase protein intake. Hyperbaric Oxygen Therapy: Wound #1 Left,Lateral,Plantar Foot: Evaluate for HBO Therapy Indication: - Osteomyelitis; Abscess 2.0 ATA for 90 Minutes without Air Breaks One treatment per day (delivered Monday through Friday unless otherwise specified in Special Instructions below): Total # of Treatments: - 30-40 Follow Hyperbaric Oxygen Glycemia Protocol Medications-please add to medication list.: Wound #1 Left,Lateral,Plantar Foot: P.O. Antibiotics - Doxycycline and Cipro prescribed by Dr. Sampson Goon Schnetzer, Gredmarie M. ([id number]) Having opened out the wound nicely it is probing down deep to bone and we will continue packing with Gardiner Rhyme form gauze and changing the dressing daily. She is also benefiting from hyperbaric oxygen therapy and we will continue  this in addition to her oral antibiotics. I have again spoken to her about her smoking and she says she is going to try nicotine patches and work on this. She will come back on a regular basis for hyperbaric oxygen therapy. Electronic Signature(s) Signed: 03/09/2015 9:03:59 AM By: [name] MD, FACS Entered By: [name] on 03/09/2015 09:03:59 Stangelo, [name] -------------------------------------------------------------------------------- SuperBill Details Patient Name: Bonnell, Eilah M. Date of Service: 03/09/2015 Medical Record Patient Account Number: [id number] Number: Afful, RN, BSN, Treating RN: 1975/06/24 (39 y.o. Walden [name] Date of Birth/Sex: Female) Other Clinician: Primary Care Physician: [name] Treating Spiros Greenfeld Referring Physician: [name] Physician/Extender: [name] in Treatment: 14 Diagnosis Coding ICD-10 Codes Code Description E11.621 Type 2 diabetes mellitus with foot ulcer E08.40 Diabetes mellitus due to underlying condition with diabetic neuropathy, unspecified L97.513 Non-pressure chronic ulcer of other part of right foot with necrosis of muscle F17.218 Nicotine dependence, cigarettes, with other nicotine-induced disorders E66.01 Morbid (severe) obesity due to excess calories L03.116 Cellulitis of left lower limb 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, left ankle and foot Facility Procedures CPT4: Description Modifier Quantity Code 78295621 11043 - DEB MUSC/FASCIA 20 SQ CM/< 1 ICD-10 Description Diagnosis E11.621 Type 2 diabetes mellitus with foot ulcer 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, left ankle and foot L97.513 Non-pressure  chronic ulcer of other part of right foot with necrosis of muscle Physician Procedures CPT4: Description Modifier Quantity Code [id number] 11043 - WC PHYS DEBR MUSCLE/FASCIA 20 SQ CM 1 ICD-10 Description Diagnosis E11.621 Type 2 diabetes mellitus with foot ulcer M86.372 Chronic  multifocal osteomyelitis, left ankle and foot L97.513  Non-pressure chronic ulcer of other part of right foot with necrosis of muscle Germond, [name] ([id number]) Electronic Signature(s) Signed: 03/09/2015 9:04:16 AM By: [name] MD, FACS Entered By: [name] on 03/09/2015 09:04:16

## 2015-03-10 NOTE — Progress Notes (Signed)
REDELL, [name] ([id number]) Visit Report for 03/09/2015 Arrival Information Details Patient Name: [name], [name] Date of Service: 03/09/2015 8:45 AM Medical Record Number: [id number] Patient Account Number: [id number] Date of Birth/Sex: [birth year] (39 y.o. Female) Treating RN: Afful, RN, BSN, [place] Sink Primary Care Physician: [name] Other Clinician: Referring Physician: [name] Treating Physician/Extender: [name] in Treatment: 14 Visit Information History Since Last Visit Any new allergies or adverse reactions: No Patient Arrived: Ambulatory Had a fall or experienced change in No Arrival Time: 08:37 activities of daily living that may affect Accompanied By: self risk of falls: Transfer Assistance: None Signs or symptoms of abuse/neglect since last No Patient Identification Verified: Yes visito Secondary Verification Process Yes Hospitalized since last visit: No Completed: Has Dressing in Place as Prescribed: Yes Patient Has Alerts: Yes Has Footwear/Offloading in Place as Yes Patient Alerts: Type II Diabetic Prescribed: 11/30/2014 ABI: Left: Wedge (L) 1.24 (R) Shoe 0.92 Right: Wedge Shoe Pain Present Now: No Electronic Signature(s) Signed: 03/09/2015 4:40:49 PM By: Elpidio Eric BSN, RN Entered By: Elpidio Eric on 03/09/2015 08:39:07 Sweitzer, [name] ([id number]) -------------------------------------------------------------------------------- Encounter Discharge Information Details Patient Name: [name], [name] Date of Service: 03/09/2015 8:45 AM Medical Record Number: [id number] Patient Account Number: [id number] Date of Birth/Sex: 11-18-1975 (39 y.o. Female) Treating RN: Afful, RN, BSN, [place] Sink Primary Care Physician: [name] Other Clinician: Referring Physician: [name] Treating Physician/Extender: [name] in Treatment: 14 Encounter Discharge Information Items Discharge Pain Level: 0 Discharge Condition:  Stable Ambulatory Status: Ambulatory Other (Note Discharge Destination: Required) Accompanied By: self Schedule Follow-up Appointment: No Medication Reconciliation completed and provided to Patient/Care No [name]: Clinical Summary of Care: Notes Discharged to HBO for treatment Electronic Signature(s) Signed: 03/09/2015 4:40:49 PM By: Elpidio Eric BSN, RN Entered By: Elpidio Eric on 03/09/2015 09:00:55 Loeza, [name] ([id number]) -------------------------------------------------------------------------------- Lower Extremity Assessment Details Patient Name: [name], [name] Date of Service: 03/09/2015 8:45 AM Medical Record Number: [id number] Patient Account Number: [id number] Date of Birth/Sex: [birth year] (39 y.o. Female) Treating RN: Afful, RN, BSN, [place] Sink Primary Care Physician: [name] Other Clinician: Referring Physician: [name] Treating Physician/Extender: [name] in Treatment: 14 Vascular Assessment Pulses: Posterior Tibial Dorsalis Pedis Palpable: [Left:Yes] [Right:Yes] Extremity colors, hair growth, and conditions: Extremity Color: [Left:Pale] Hair Growth on Extremity: [Left:Yes] Temperature of Extremity: [Left:Warm] Capillary Refill: [Left:< 3 seconds] Toe Nail Assessment Left: Right: Thick: Yes Discolored: No Deformed: No Improper Length and Hygiene: No Electronic Signature(s) Signed: 03/09/2015 4:40:49 PM By: Elpidio Eric BSN, RN Entered By: Elpidio Eric on 03/09/2015 08:43:07 Sparlin, [name] ([id number]) -------------------------------------------------------------------------------- Multi Wound Chart Details Patient Name: [name], [name] Date of Service: 03/09/2015 8:45 AM Medical Record Number: [id number] Patient Account Number: [id number] Date of Birth/Sex: Sep 07, 1975 (39 y.o. Female) Treating RN: Clover Mealy, RN, BSN, [place] Sink Primary Care Physician: [name] Other Clinician: Referring Physician: [name] Treating Physician/Extender: [name] in Treatment: 14 Vital Signs Height(in): 67 Pulse(bpm): 88 Weight(lbs): 290 Blood Pressure 140/87 (mmHg): Body Mass Index(BMI): 45 Temperature(F): 97.9 Respiratory Rate 18 (breaths/min): Photos: [1:No Photos] [N/A:N/A] Wound Location: [1:Left Foot - Plantar, Lateral N/A] Wounding Event: [1:Gradually Appeared] [N/A:N/A] Primary Etiology: [1:Diabetic Wound/Ulcer of N/A the Lower Extremity] Comorbid History: [1:Type II Diabetes] [N/A:N/A] Date Acquired: [1:11/21/2013] [N/A:N/A] Weeks of Treatment: [1:14] [N/A:N/A] Wound Status: [1:Open] [N/A:N/A] Measurements L x W x D 1x0.1x3 [N/A:N/A] (cm) Area (cm) : [1:0.079] [N/A:N/A] Volume (cm) : [1:0.236] [N/A:N/A] % Reduction in Area: [1:96.50%] [N/A:N/A] % Reduction in Volume: 88.30% [N/A:N/A] Classification: [1:Grade 3] [N/A:N/A] [name]  Verification: [1:Abscess] [N/A:N/A] Exudate Amount: [1:Large] [N/A:N/A] Exudate Type: [1:Serosanguineous] [N/A:N/A] Exudate Color: [1:red, brown] [N/A:N/A] Foul Odor After [1:Yes] [N/A:N/A] Cleansing: Odor Anticipated Due to No [N/A:N/A] Product Use: Wound Margin: [1:Thickened] [N/A:N/A] Granulation Amount: [1:Medium (34-66%)] [N/A:N/A] Granulation Quality: [1:Pink] [N/A:N/A] Necrotic Amount: [1:Small (1-33%)] [N/A:N/A] Exposed Structures: [N/A:N/A] Bone: Yes Fascia: No Fat: No Tendon: No Muscle: No Joint: No Epithelialization: Small (1-33%) N/A N/A Periwound Skin Texture: Edema: Yes N/A N/A Callus: Yes Excoriation: No Induration: No Crepitus: No Fluctuance: No Friable: No Rash: No Scarring: No Periwound Skin Moist: Yes N/A N/A Moisture: Maceration: No Dry/Scaly: No Periwound Skin Color: Atrophie Blanche: No N/A N/A Cyanosis: No Ecchymosis: No Erythema: No Hemosiderin Staining: No Mottled: No Pallor: No Rubor: No Temperature: No Abnormality N/A N/A Tenderness on No N/A N/A Palpation: Wound Preparation: Ulcer  Cleansing: N/A N/A Rinsed/Irrigated with Saline Topical Anesthetic Applied: Other: lidocaine 4% Treatment Notes Electronic Signature(s) Signed: 03/09/2015 4:40:49 PM By: Elpidio Eric BSN, RN Entered By: Elpidio Eric on 03/09/2015 08:45:47 Feld, [name] ([id number]) -------------------------------------------------------------------------------- Multi-Disciplinary Care Plan Details Patient Name: Holloran, [name] Date of Service: 03/09/2015 8:45 AM Medical Record Number: [id number] Patient Account Number: [id number] Date of Birth/Sex: 1975-08-03 (39 y.o. Female) Treating RN: Afful, RN, BSN, [place] Sink Primary Care Physician: [name] Other Clinician: Referring Physician: [name] Treating Physician/Extender: [name] in Treatment: 14 Active Inactive HBO Nursing Diagnoses: Anxiety related to feelings of confinement associated with the hyperbaric oxygen chamber Anxiety related to knowledge deficit of hyperbaric oxygen therapy and treatment procedures Discomfort related to temperature and humidity changes inside hyperbaric chamber Potential for barotraumas to ears, sinuses, teeth, and lungs or cerebral gas embolism related to changes in atmospheric pressure inside hyperbaric oxygen chamber Potential for oxygen toxicity seizures related to delivery of 100% oxygen at an increased atmospheric pressure Potential for pulmonary oxygen toxicity related to delivery of 100% oxygen at an increased atmospheric pressure Goals: Barotrauma will be prevented during HBO2 Date Initiated: 02/15/2015 Goal Status: Active Patient and/or family will be able to state/discuss factors appropriate to the management of their disease process during treatment Date Initiated: 02/15/2015 Goal Status: Active Patient will tolerate the hyperbaric oxygen therapy treatment Date Initiated: 02/15/2015 Goal Status: Active Patient will tolerate the internal climate of the chamber Date Initiated:  02/15/2015 Goal Status: Active Patient/caregiver will verbalize understanding of HBO goals, rationale, procedures and potential hazards Date Initiated: 02/15/2015 Goal Status: Active Signs and symptoms of pulmonary oxygen toxicity will be recognized and promptly addressed Date Initiated: 02/15/2015 Goal Status: Active Signs and symptoms of seizure will be recognized and promptly addressed ; seizing patients will suffer no harm Date Initiated: 02/15/2015 Whipkey, [name] ([id number]) Goal Status: Active Interventions: Administer a five (5) minute air break for patient if signs and symptoms of seizure appear and notify the hyperbaric physician Administer the correct therapeutic gas delivery based on the patients needs and limitations, per physician order Assess and provide for patientos comfort related to the hyperbaric environment and equalization of middle ear Assess for signs and symptoms related to adverse events, including but not limited to confinement anxiety, pneumothorax, oxygen toxicity and baurotrauma Assess patient for any history of confinement anxiety Assess patient's knowledge and expectations regarding hyperbaric medicine and provide education related to the hyperbaric environment, goals of treatment and prevention of adverse events Notes: Nutrition Nursing Diagnoses: Potential for alteratiion in Nutrition/Potential for imbalanced nutrition Goals: Patient/caregiver agrees to and verbalizes understanding of need to use nutritional supplements and/or vitamins as prescribed Date Initiated: 11/30/2014 Goal Status:  Active Interventions: Assess patient nutrition upon admission and as needed per policy Notes: Orientation to the Wound Care Program Nursing Diagnoses: Knowledge deficit related to the wound healing center program Goals: Patient/caregiver will verbalize understanding of the Wound Healing Center Program Date Initiated: 11/30/2014 Goal Status:  Active Interventions: Provide education on orientation to the wound center Notes: [name], [name] ([id number]) Peripheral Neuropathy Nursing Diagnoses: Potential alteration in peripheral tissue perfusion (select prior to confirmation of diagnosis) Goals: Patient/caregiver will verbalize understanding of disease process and disease management Date Initiated: 11/30/2014 Goal Status: Active Interventions: Assess signs and symptoms of neuropathy upon admission and as needed Notes: Wound/Skin Impairment Nursing Diagnoses: Impaired tissue integrity Knowledge deficit related to smoking impact on wound healing Goals: Ulcer/skin breakdown will have a volume reduction of 30% by week 4 Date Initiated: 11/30/2014 Goal Status: Active Interventions: Assess ulceration(s) every visit Notes: Electronic Signature(s) Signed: 03/09/2015 4:40:49 PM By: Elpidio Eric BSN, RN Entered By: Elpidio Eric on 03/09/2015 08:45:10 Carew, [name] ([id number]) -------------------------------------------------------------------------------- Pain Assessment Details Patient Name: Gillooly, Shanicqua M. Date of Service: 03/09/2015 8:45 AM Medical Record Number: [id number] Patient Account Number: [id number] Date of Birth/Sex: Jul 30, 1975 (39 y.o. Female) Treating RN: Clover Mealy, RN, BSN, [place] Sink Primary Care Physician: [name] Other Clinician: Referring Physician: [name] Treating Physician/Extender: [name] in Treatment: 14 Active Problems Location of Pain Severity and Description of Pain Patient Has Paino No Site Locations Pain Management and Medication Current Pain Management: Electronic Signature(s) Signed: 03/09/2015 4:40:49 PM By: Elpidio Eric BSN, RN Entered By: Elpidio Eric on 03/09/2015 08:39:15 Earnhardt, [name] ([id number]) -------------------------------------------------------------------------------- Patient/Caregiver Education Details Patient Name: Hadsall, [name] Date of Service: 03/09/2015 8:45 AM Medical Record Number: [id number] Patient Account Number: [id number] Date of Birth/Gender: 1975/08/22 (39 y.o. Female) Treating RN: Afful, RN, BSN, [place] Sink Primary Care Physician: [name] Other Clinician: Referring Physician: [name] Treating Physician/Extender: [name] in Treatment: 14 Education Assessment Education Provided To: Patient Education Topics Provided Welcome To The Wound Care Center: Methods: Explain/Verbal Responses: State content correctly Electronic Signature(s) Signed: 03/09/2015 4:40:49 PM By: Elpidio Eric BSN, RN Entered By: Elpidio Eric on 03/09/2015 09:01:04 Yokley, [name] ([id number]) -------------------------------------------------------------------------------- Wound Assessment Details Patient Name: [name], [name] Date of Service: 03/09/2015 8:45 AM Medical Record Number: [id number] Patient Account Number: [id number] Date of Birth/Sex: 07/30/1975 (39 y.o. Female) Treating RN: Afful, RN, BSN, [place] Sink Primary Care Physician: [name] Other Clinician: Referring Physician: [name] Treating Physician/Extender: [name] in Treatment: 14 Wound Status Wound Number: 1 Primary Diabetic Wound/Ulcer of the Lower Etiology: Extremity Wound Location: Left Foot - Plantar, Lateral Wound Status: Open Wounding Event: Gradually Appeared Comorbid Type II Diabetes Date Acquired: 11/21/2013 History: Weeks Of Treatment: 14 Clustered Wound: No Photos Photo Uploaded By: Elpidio Eric on 03/09/2015 15:15:42 Wound Measurements Length: (cm) 1 Width: (cm) 0.1 Depth: (cm) 3 Area: (cm) 0.079 Volume: (cm) 0.236 % Reduction in Area: 96.5% % Reduction in Volume: 88.3% Epithelialization: Small (1-33%) Tunneling: No Undermining: No Wound Description Classification: Grade 3 [place] Verification: Abscess Wound Margin: Thickened Exudate Amount: Large Exudate Type:  Serosanguineous Exudate Color: red, brown Foul Odor After Cleansing: Yes Due to Product Use: No Wound Bed Granulation Amount: Medium (34-66%) Exposed Structure Granulation Quality: Pink Fascia Exposed: No Necrotic Amount: Small (1-33%) Fat Layer Exposed: No [name], [name] ([id number]) Necrotic Quality: Adherent Slough Tendon Exposed: No Muscle Exposed: No Joint Exposed: No Bone Exposed: Yes Periwound Skin Texture Texture Color No Abnormalities Noted: No No Abnormalities Noted: No Callus: Yes Atrophie  Blanche: No Crepitus: No Cyanosis: No Excoriation: No Ecchymosis: No Fluctuance: No Erythema: No Friable: No Hemosiderin Staining: No Induration: No Mottled: No Localized Edema: Yes Pallor: No Rash: No Rubor: No Scarring: No Temperature / Pain Moisture Temperature: No Abnormality No Abnormalities Noted: No Dry / Scaly: No Maceration: No Moist: Yes Wound Preparation Ulcer Cleansing: Rinsed/Irrigated with Saline Topical Anesthetic Applied: Other: lidocaine 4%, Treatment Notes Wound #1 (Left, Lateral, Plantar Foot) 1. Cleansed with: Clean wound with Normal Saline 4. Dressing Applied: Iodoform packing Gauze 5. Secondary Dressing Applied Bordered Foam Dressing 6. Footwear/Offloading device applied Other footwear/offloading device applied (specify in notes) Notes darco Electronic Signature(s) Signed: 03/09/2015 4:40:49 PM By: Elpidio Eric BSN, RN Entered By: Elpidio Eric on 03/09/2015 08:43:29 Winberry, [name] ([id number]) -------------------------------------------------------------------------------- Vitals Details Patient Name: [name], [name] Date of Service: 03/09/2015 8:45 AM Medical Record Number: [id number] Patient Account Number: [id number] Date of Birth/Sex: 03-14-1976 (39 y.o. Female) Treating RN: Afful, RN, BSN, [place] Sink Primary Care Physician: [name] Other Clinician: Referring Physician: [name] Treating Physician/Extender:  [name] in Treatment: 14 Vital Signs Time Taken: 08:39 Temperature (F): 97.9 Height (in): 67 Pulse (bpm): 88 Weight (lbs): 290 Respiratory Rate (breaths/min): 18 Body Mass Index (BMI): 45.4 Blood Pressure (mmHg): 140/87 Reference Range: 80 - 120 mg / dl Electronic Signature(s) Signed: 03/09/2015 4:40:49 PM By: Elpidio Eric BSN, RN Entered By: Elpidio Eric on 03/09/2015 08:39:36

## 2015-03-12 ENCOUNTER — Encounter: Payer: Medicaid Other | Admitting: Surgery

## 2015-03-13 ENCOUNTER — Encounter: Payer: Medicaid Other | Admitting: Surgery

## 2015-03-14 ENCOUNTER — Encounter: Payer: Medicaid Other | Admitting: Surgery

## 2015-03-15 ENCOUNTER — Encounter: Payer: Medicaid Other | Admitting: Surgery

## 2015-03-16 ENCOUNTER — Ambulatory Visit: Payer: Medicaid Other | Admitting: Surgery

## 2015-03-16 ENCOUNTER — Encounter: Payer: Medicaid Other | Admitting: Surgery

## 2015-03-19 ENCOUNTER — Encounter: Payer: Medicaid Other | Admitting: Surgery

## 2015-03-19 DIAGNOSIS — L97513 Non-pressure chronic ulcer of other part of right foot with necrosis of muscle: Secondary | ICD-10-CM | POA: Diagnosis not present

## 2015-03-19 LAB — GLUCOSE, CAPILLARY: GLUCOSE-CAPILLARY: 217 mg/dL — AB (ref 65–99)

## 2015-03-19 NOTE — Progress Notes (Signed)
[name], [name] ([id number]) Visit Report for 03/19/2015 HBO Details Patient Name: [name], [name] 03/19/2015 10:00 Date of Service: AM Medical Record [id number] Number: Patient Account Number: [id number] July 18, 1975 (39 y.o. Treating RN: Date of Birth/Sex: Female) Other Clinician: [name] Primary Care Physician: [name] Treating Britto, Errol Referring Physician: [name] Physician/Extender: [name] in Treatment: 15 HBO Treatment Course Details Treatment Course Ordering Physician: [name] 1 Number: HBO Treatment Start Date: 02/19/2015 Total Treatments 30 Ordered: HBO Indication: Diabetic Ulcer(s) of the Lower Extremity HBO Treatment Details Treatment Number: 13 Patient Type: Outpatient Chamber Type: Monoplace Chamber #: [id number] Treatment Protocol: 2.0 ATA with 90 minutes oxygen, and no air breaks Treatment Details Compression Rate Down: 1.5 psi / minute De-Compression Rate Up: Air breaks and breathing Compress Tx Pressure Decompress Decompress periods Begins Reached Begins Ends (leave unused spaces blank) Chamber Pressure 1 ATA 2.0 ATA - - - - - - 2.0 ATA 1 ATA Clock Time (24 hr) 09:44 - - - - - - - - 10:05 Treatment Length: 21 (minutes) Treatment Segments: 1 Capillary Blood Glucose Pre Capillary Blood Glucose (mg/dl): Post Capillary Blood Glucose (mg/dl): Vital Signs Capillary Blood Glucose Reference Range: 80 - 120 mg / dl HBO Diabetic Blood Glucose Intervention Range: <131 mg/dl or >865 mg/dl Time Vitals Blood Respiratory Capillary Blood Glucose Pulse Action Type: Pulse: Temperature: Taken: Pressure: Rate: Glucose (mg/dl): Meter #: Oximetry (%) Taken: Pre 09:30 142/92 84 18 97.9 217 1 none Treatment Response Scheier, Shekita M. ([id number]) Adverse Events: 1:Barotrauma - Sinus Treatment Completion Status: Treatment Aborted/Not Restarted Reason: Adverse Event Treatment Notes Made two attempts to dive patient both  resulting in sinus barotrauma. On first attempt patient reached 1.27 ATA and had terrible sinus pain. Pressure was leveled off and sinus pressure was relieved. Several attempts were made to slowly increase pressure at a reduced rate of 1.0 but patient continued to sinus pain. Patient could not go beyond 1.48 ATA so was brought to surface. MD was notified and gave verbal orders to give patient nasal spray and wait 20 minutes and attempt to dive again. Dr. Meyer Russel stated that if she could not dive he wanted her to see her PCP [name], MD. On second attempt patient complained of sinus pain at 1.12 ATA. Patient was brought to surface and I called PCP's office. First available appt. was Wednesday, 03/21/15 at 3:30 pm. and she accepted. Dr. Meyer Russel came in and spoke to patient and made it clear that she needed to be coming for her treatments in a timely manner as she missed the whole week last week. He told her to go home and take sinus medication and come back to try again in the morning. Patient agreed. Electronic Signature(s) Signed: 03/19/2015 11:06:48 AM By: Sallee Provencal, RRT, CHT Signed: 03/19/2015 4:29:43 PM By: [name] MD, FACS Entered By: Dayton Martes on 03/19/2015 11:04:57 Cregger, [name] ([id number]) -------------------------------------------------------------------------------- HBO Safety Checklist Details Patient Name: [name], [name]. 03/19/2015 10:00 Date of Service: AM Medical Record [id number] Number: Patient Account Number: [id number] Sep 08, 1975 (39 y.o. Treating RN: Date of Birth/Sex: Female) Other Clinician: [name] Primary Care Physician: [name] Treating Britto, Errol Referring Physician: [name] Physician/Extender: [name] in Treatment: 15 HBO Safety Checklist Items Safety Checklist Consent Form Signed Patient voided / foley secured and emptied When did you last eato 11:00 am on 03/18/15 Last  dose of injectable or oral agent 20:00 pm on 03/18/15 NA Ostomy pouch emptied and vented if applicable NA All implantable  devices assessed, documented and approved NA Intravenous access site secured and place Valuables secured Linens and cotton and cotton/polyester blend (less than 51% polyester) Personal oil-based products / skin lotions / body lotions removed Wigs or hairpieces removed Smoking or tobacco materials removed Books / newspapers / magazines / loose paper removed Cologne, aftershave, perfume and deodorant removed Jewelry removed (may wrap wedding band) Make-up removed Hair care products removed Battery operated devices (external) removed Heating patches and chemical warmers removed NA Titanium eyewear removed Nail polish cured greater than 10 hours NA Casting material cured greater than 10 hours NA Hearing aids removed NA Loose dentures or partials removed NA Prosthetics have been removed Patient demonstrates correct use of air break device (if applicable) Patient concerns have been addressed Patient grounding bracelet on and cord attached to chamber Specifics for Inpatients (complete in addition to above) Medication sheet sent with patient Skora, RYLA CAUTHON. ([id number]) Intravenous medications needed or due during therapy sent with patient Drainage tubes (e.g. nasogastric tube or chest tube secured and vented) Endotracheal or Tracheotomy tube secured Cuff deflated of air and inflated with saline Airway suctioned Electronic Signature(s) Signed: 03/19/2015 11:06:48 AM By: Dayton Martes RCP, RRT, CHT Entered By: Dayton Martes on 03/19/2015 09:46:52

## 2015-03-19 NOTE — Progress Notes (Signed)
GLENDELL, SCHLOTTMAN ([id number]) Visit Report for 03/19/2015 Arrival Information Details Patient Name: [name], [name] 03/19/2015 10:00 Date of Service: AM Medical Record [id number] Number: Patient Account Number: [id number] 1975-08-01 (39 y.o. Treating RN: Date of Birth/Sex: Female) Other Clinician: [name] Primary Care Physician: [name] Treating Britto, Errol Referring Physician: [name] Physician/Extender: [name] in Treatment: 15 Visit Information History Since Last Visit Added or deleted any medications: No Patient Arrived: Ambulatory Any new allergies or adverse reactions: No Arrival Time: 09:30 Had a fall or experienced change in No Accompanied By: self activities of daily living that may affect Transfer Assistance: None risk of falls: Patient Identification Verified: Yes Signs or symptoms of abuse/neglect No Secondary Verification Process Yes since last visito Completed: Hospitalized since last visit: No Patient Has Alerts: Yes Has Dressing in Place as Prescribed: Yes Patient Alerts: Type II Diabetic Has Footwear/Offloading in Place as Yes 11/30/2014 ABI: Prescribed: (L) 1.24 (R) Left: Surgical Shoe with 0.92 Pressure Relief Insole Right: Surgical Shoe with Pressure Relief Insole Pain Present Now: No Electronic Signature(s) Signed: 03/19/2015 11:06:48 AM By: Dayton Martes RCP, RRT, CHT Entered By: Weyman Rodney, Lucio Edward on 03/19/2015 09:41:49 [name], [name] ([id number]) -------------------------------------------------------------------------------- Encounter Discharge Information Details Patient Name: [name] 03/19/2015 10:00 Date of Service: AM Medical Record [id number] Number: Patient Account Number: [id number] December 18, 1975 (39 y.o. Treating RN: Date of Birth/Sex: Female) Other Clinician: [name] Primary Care Physician: [name] Treating [name] Referring Physician:  [name] Physician/Extender: [name] in Treatment: 15 Encounter Discharge Information Items Discharge Pain Level: 0 Discharge Condition: Stable Ambulatory Status: Ambulatory Discharge Destination: Home Private Transportation: Auto Accompanied By: self Schedule Follow-up Appointment: No Medication Reconciliation completed and No provided to Patient/Care Verdean Murin: Clinical Summary of Care: Notes Patient has an HBO treat scheduled on 03/20/15 at 10:00 am. Electronic Signature(s) Signed: 03/19/2015 11:06:48 AM By: Dayton Martes RCP, RRT, CHT Entered By: Dayton Martes on 03/19/2015 11:06:18 [name], [name] ([id number]) -------------------------------------------------------------------------------- Vitals Details Patient Name: [name] 03/19/2015 10:00 Date of Service: AM Medical Record [id number] Number: Patient Account Number: [id number] 1975/11/24 (39 y.o. Treating RN: Date of Birth/Sex: Female) Other Clinician: [name] Primary Care Physician: [name] Treating Britto, Errol Referring Physician: [name] Physician/Extender: [name] in Treatment: 15 Vital Signs Time Taken: 09:30 Temperature (F): 97.9 Height (in): 67 Pulse (bpm): 84 Weight (lbs): 290 Respiratory Rate (breaths/min): 18 Body Mass Index (BMI): 45.4 Blood Pressure (mmHg): 142/92 Capillary Blood Glucose (mg/dl): 742 Reference Range: 80 - 120 mg / dl Electronic Signature(s) Signed: 03/19/2015 11:06:48 AM By: Dayton Martes RCP, RRT, CHT Entered By: Dayton Martes on 03/19/2015 09:43:20

## 2015-03-20 ENCOUNTER — Encounter: Payer: Medicaid Other | Admitting: Surgery

## 2015-03-20 DIAGNOSIS — L97513 Non-pressure chronic ulcer of other part of right foot with necrosis of muscle: Secondary | ICD-10-CM | POA: Diagnosis not present

## 2015-03-20 LAB — GLUCOSE, CAPILLARY
Glucose-Capillary: 187 mg/dL — ABNORMAL HIGH (ref 65–99)
Glucose-Capillary: 158 mg/dL — ABNORMAL HIGH (ref 65–99)

## 2015-03-20 NOTE — Progress Notes (Signed)
[name] ([id number]) Visit Report for 03/20/2015 Arrival Information Details Patient Name: [name], [name] 03/20/2015 10:00 Date of Service: AM Medical Record [id number] Number: Patient Account Number: [id number] Apr 03, 1976 (39 y.o. Treating RN: Date of Birth/Sex: Female) Other Clinician: [name] Primary Care Physician: [name] Treating Britto, Errol Referring Physician: [name] Physician/Extender: [name] in Treatment: 15 Visit Information History Since Last Visit Added or deleted any medications: No Patient Arrived: Ambulatory Any new allergies or adverse reactions: No Arrival Time: 09:20 Had a fall or experienced change in No Accompanied By: self activities of daily living that may affect Transfer Assistance: None risk of falls: Patient Identification Verified: Yes Signs or symptoms of abuse/neglect No Secondary Verification Process Yes since last visito Completed: Hospitalized since last visit: No Patient Has Alerts: Yes Has Dressing in Place as Prescribed: Yes Patient Alerts: Type II Diabetic Has Footwear/Offloading in Place as Yes 11/30/2014 ABI: Prescribed: (L) 1.24 (R) Left: Surgical Shoe with 0.92 Pressure Relief Insole Right: Surgical Shoe with Pressure Relief Insole Pain Present Now: No Electronic Signature(s) Signed: 03/20/2015 3:00:55 PM By: Dayton Martes RCP, RRT, CHT Entered By: Dayton Martes on 03/20/2015 09:34:33 Porta, Rosezella [name] ([id number]) -------------------------------------------------------------------------------- Encounter Discharge Information Details Patient Name: [name] 03/20/2015 10:00 Date of Service: AM Medical Record [id number] Number: Patient Account Number: [id number] 09/11/1975 (39 y.o. Treating RN: Date of Birth/Sex: Female) Other Clinician: [name] Primary Care Physician: [name] Treating [name] Referring Physician:  [name] Physician/Extender: [name] in Treatment: 15 Encounter Discharge Information Items Discharge Pain Level: 0 Discharge Condition: Stable Ambulatory Status: Ambulatory Discharge Destination: Home Private Transportation: Auto Accompanied By: self Schedule Follow-up Appointment: No Medication Reconciliation completed and No provided to Patient/Care Alison Kubicki: Clinical Summary of Care: Notes Patient has an HBO treatment scheduled on 03/21/15 at 10:00 am. Electronic Signature(s) Signed: 03/20/2015 3:00:55 PM By: Dayton Martes RCP, RRT, CHT Entered By: Weyman Rodney, Lucio Edward on 03/20/2015 11:48:30 Dwyer, Rosezella [name] ([id number]) -------------------------------------------------------------------------------- Vitals Details Patient Name: [name] 03/20/2015 10:00 Date of Service: AM Medical Record [id number] Number: Patient Account Number: [id number] 02-06-1976 (39 y.o. Treating RN: Date of Birth/Sex: Female) Other Clinician: [name] Primary Care Physician: [name] Treating Britto, Errol Referring Physician: [name] Physician/Extender: [name] in Treatment: 15 Vital Signs Time Taken: 09:23 Temperature (F): 97.7 Height (in): 67 Pulse (bpm): 78 Weight (lbs): 290 Respiratory Rate (breaths/min): 18 Body Mass Index (BMI): 45.4 Blood Pressure (mmHg): 124/82 Capillary Blood Glucose (mg/dl): 536 Reference Range: 80 - 120 mg / dl Electronic Signature(s) Signed: 03/20/2015 3:00:55 PM By: Dayton Martes RCP, RRT, CHT Entered By: Dayton Martes on 03/20/2015 09:35:08

## 2015-03-20 NOTE — Progress Notes (Signed)
[name] Visit Report for 03/20/2015 HBO Details [name], [name] Number: [name] Account Number: [id number] 1976/02/05 (39 y.o. Treating RN: Date of Birth/Sex: Female) Other Clinician: [name] Primary Care Physician: [name] Treating Britto, Errol Referring Physician: [name] Physician/Extender: [name] in Treatment: 15 HBO Treatment Course Details Treatment Course Ordering Physician: [name] 1 Number: HBO Treatment Start Date: 02/19/2015 Total Treatments 30 Ordered: HBO Indication: Diabetic Ulcer(s) of the Lower Extremity HBO Treatment Details Treatment Number: 14 [name] Type: Outpatient Chamber Type: Monoplace Chamber #: [id number] Treatment Protocol: 2.0 ATA with 90 minutes oxygen, and no air breaks Treatment Details Compression Rate Down: 1.5 psi / minute De-Compression Rate Up: 1.5 psi / minute Air breaks and breathing Compress Tx Pressure Decompress Decompress periods Begins Reached Begins Ends (leave unused spaces blank) Chamber Pressure 1 ATA 2.0 ATA - - - - - - 2.0 ATA 1 ATA Clock Time (24 hr) 09:33 09:44 - - - - - - 11:15 11:26 Treatment Length: 113 (minutes) Treatment Segments: 4 Capillary Blood Glucose Pre Capillary Blood Glucose (mg/dl): Post Capillary Blood Glucose (mg/dl): Vital Signs Capillary Blood Glucose Reference Range: 80 - 120 mg / dl HBO Diabetic Blood Glucose Intervention Range: <131 mg/dl or >295 mg/dl Time Vitals Blood Respiratory Capillary Blood Glucose Pulse Action Type: Pulse: Temperature: Taken: Pressure: Rate: Glucose (mg/dl): Meter #: Oximetry (%) Taken: Pre 09:23 124/82 78 18 97.7 187 1 none Post 11:35 122/80 66 18 97.6 158 1 none Treatment Response [name], [name] ([id number]) Treatment Completion Status: Treatment Completed without Adverse Event HBO Attestation I certify that I  supervised this HBO treatment in accordance with Medicare guidelines. A trained Yes emergency response team is readily available per hospital policies and procedures. Continue HBOT as ordered. Yes Electronic Signature(s) Signed: 03/20/2015 11:50:57 AM By: [name] MD, FACS Entered By: [name] on 03/20/2015 11:50:56 [name], [name] ([id number]) -------------------------------------------------------------------------------- HBO Safety Checklist Details [name], DIX. 03/20/2015 10:00 Date of Service: AM Medical Record [id number] Number: [name] Account Number: [id number] [birth year] (39 y.o. Treating RN: Date of Birth/Sex: Female) Other Clinician: [name] Primary Care Physician: [name] Treating Britto, Errol Referring Physician: [name] Physician/Extender: [name] in Treatment: 15 HBO Safety Checklist Items Safety Checklist Consent Form Signed [name] voided / foley secured and emptied When did you last eato 07:30 am Last dose of injectable or oral agent 08:00 am Ostomy pouch emptied and vented if applicable All implantable devices assessed, documented and approved Intravenous access site secured and place Valuables secured Linens and cotton and cotton/polyester blend (less than 51% polyester) Personal oil-based products / skin lotions / body lotions removed Wigs or hairpieces removed Smoking or tobacco materials removed Books / newspapers / magazines / loose paper removed Cologne, aftershave, perfume and deodorant removed Jewelry removed (may wrap wedding band) Make-up removed Hair care products removed Battery operated devices (external) removed Heating patches and chemical warmers removed NA Titanium eyewear removed Nail polish cured greater than 10 hours NA Casting material cured greater than 10 hours NA Hearing aids removed NA Loose dentures or partials removed NA Prosthetics have been removed [name]  demonstrates correct use of air break device (if applicable) [name] concerns have been addressed [name] grounding bracelet on and cord attached to chamber Specifics for Inpatients (complete in addition to above) Medication sheet sent with [name] [name], GAWRONSKI. ([id number]) Intravenous medications needed or due during therapy sent with [name] Drainage tubes (e.g. nasogastric tube  or chest tube secured and vented) Endotracheal or Tracheotomy tube secured Cuff deflated of air and inflated with saline Airway suctioned Electronic Signature(s) Signed: 03/20/2015 3:00:55 PM By: Dayton Martes RCP, RRT, CHT Entered By: Dayton Martes on 03/20/2015 09:35:54

## 2015-03-21 ENCOUNTER — Encounter: Payer: Medicaid Other | Admitting: Surgery

## 2015-03-21 DIAGNOSIS — L97513 Non-pressure chronic ulcer of other part of right foot with necrosis of muscle: Secondary | ICD-10-CM | POA: Diagnosis not present

## 2015-03-21 LAB — GLUCOSE, CAPILLARY
GLUCOSE-CAPILLARY: 177 mg/dL — AB (ref 65–99)
GLUCOSE-CAPILLARY: 174 mg/dL — AB (ref 65–99)

## 2015-03-22 ENCOUNTER — Encounter: Payer: Medicaid Other | Admitting: Surgery

## 2015-03-22 DIAGNOSIS — L97513 Non-pressure chronic ulcer of other part of right foot with necrosis of muscle: Secondary | ICD-10-CM | POA: Diagnosis not present

## 2015-03-22 LAB — GLUCOSE, CAPILLARY
Glucose-Capillary: 252 mg/dL — ABNORMAL HIGH (ref 65–99)
Glucose-Capillary: 210 mg/dL — ABNORMAL HIGH (ref 65–99)

## 2015-03-22 NOTE — Progress Notes (Signed)
[name], [name] ([id number]) Visit Report for 03/22/2015 Arrival Information Details Patient Name: [name] 03/22/2015 10:00 Date of Service: AM Medical Record [id number] Number: Patient Account Number: [id number] 04-05-1976 (39 y.o. Treating RN: Date of Birth/Sex: Female) Other Clinician: [name] Primary Care Physician: [name] Treating Britto, Errol Referring Physician: [name] Physician/Extender: [name] in Treatment: 16 Visit Information History Since Last Visit Added or deleted any medications: No Patient Arrived: Ambulatory Any new allergies or adverse reactions: No Arrival Time: 09:25 Had a fall or experienced change in No Accompanied By: self activities of daily living that may affect Transfer Assistance: None risk of falls: Patient Identification Verified: Yes Signs or symptoms of abuse/neglect No Secondary Verification Process Yes since last visito Completed: Hospitalized since last visit: No Patient Has Alerts: Yes Has Dressing in Place as Prescribed: Yes Patient Alerts: Type II Diabetic Has Footwear/Offloading in Place as Yes 11/30/2014 ABI: Prescribed: (L) 1.24 (R) Left: Surgical Shoe with 0.92 Pressure Relief Insole Right: Surgical Shoe with Pressure Relief Insole Pain Present Now: No Electronic Signature(s) Signed: 03/22/2015 1:15:01 PM By: Dayton Martes RCP, RRT, CHT Entered By: Dayton Martes on 03/22/2015 09:46:34 [name] ([id number]) -------------------------------------------------------------------------------- Encounter Discharge Information Details Patient Name: [name] 03/22/2015 10:00 Date of Service: AM Medical Record [id number] Number: Patient Account Number: [id number] 05-26-1976 (39 y.o. Treating RN: Date of Birth/Sex: Female) Other Clinician: [name] Primary Care Physician: [name] Treating [name] Referring Physician:  [name] Physician/Extender: [name] in Treatment: 16 Encounter Discharge Information Items Discharge Pain Level: 0 Discharge Condition: Stable Ambulatory Status: Ambulatory Discharge Destination: Home Private Transportation: Auto Accompanied By: self Schedule Follow-up Appointment: No Medication Reconciliation completed and No provided to Patient/Care Provider: Clinical Summary of Care: Notes Patient has an HBO treatment scheduled on 03/23/15 at 10:00 am. Electronic Signature(s) Signed: 03/22/2015 1:15:01 PM By: Dayton Martes RCP, RRT, CHT Entered By: Dayton Martes on 03/22/2015 13:12:22 [name] ([id number]) -------------------------------------------------------------------------------- Vitals Details Patient Name: [name] 03/22/2015 10:00 Date of Service: AM Medical Record [id number] Number: Patient Account Number: [id number] 1975/11/13 (39 y.o. Treating RN: Date of Birth/Sex: Female) Other Clinician: [name] Primary Care Physician: [name] Treating Britto, Errol Referring Physician: [name] Physician/Extender: [name] in Treatment: 16 Vital Signs Time Taken: 09:25 Temperature (F): 97.8 Height (in): 67 Pulse (bpm): 72 Weight (lbs): 290 Respiratory Rate (breaths/min): 18 Body Mass Index (BMI): 45.4 Blood Pressure (mmHg): 140/82 Capillary Blood Glucose (mg/dl): 742 Reference Range: 80 - 120 mg / dl Electronic Signature(s) Signed: 03/22/2015 1:15:01 PM By: Dayton Martes RCP, RRT, CHT Entered By: Dayton Martes on 03/22/2015 09:47:13

## 2015-03-22 NOTE — Progress Notes (Signed)
[name], [name] ([id number]) Visit Report for 03/21/2015 HBO Details Patient Name: [name], [name] Date of Service: 03/21/2015 10:00 AM Medical Record Patient Account Number: [id number] Number: Treating RN: February 26, 1976 (39 y.o. Other Clinician: [name] Date of Birth/Sex: Female) Treating BURNS III, Primary Care Physician: [name] Physician/Extender: Zollie Beckers Referring Physician: [name] Weeks in Treatment: 15 HBO Treatment Course Details Treatment Course Ordering Physician: [name] 1 Number: HBO Treatment Start Date: 02/19/2015 Total Treatments 30 Ordered: HBO Indication: Diabetic Ulcer(s) of the Lower Extremity HBO Treatment Details Treatment Number: 15 Patient Type: Outpatient Chamber Type: Monoplace Chamber #: [id number] Treatment Protocol: 2.0 ATA with 90 minutes oxygen, and no air breaks Treatment Details Compression Rate Down: 1.0 psi / minute De-Compression Rate Up: 1.5 psi / minute Air breaks and breathing Compress Tx Pressure Decompress Decompress periods Begins Reached Begins Ends (leave unused spaces blank) Chamber Pressure 1 ATA 2.0 ATA - - - - - - 2.0 ATA 1 ATA Clock Time (24 hr) 09:28 09:46 - - - - - - 11:16 11:26 Treatment Length: 118 (minutes) Treatment Segments: 4 Capillary Blood Glucose Pre Capillary Blood Glucose (mg/dl): Post Capillary Blood Glucose (mg/dl): Vital Signs Capillary Blood Glucose Reference Range: 80 - 120 mg / dl HBO Diabetic Blood Glucose Intervention Range: <131 mg/dl or >829 mg/dl Time Vitals Blood Respiratory Capillary Blood Glucose Pulse Action Type: Pulse: Temperature: Taken: Pressure: Rate: Glucose (mg/dl): Meter #: Oximetry (%) Taken: Pre 09:21 140/80 84 18 97.8 177 1 none Post 11:31 140/98 72 18 97.6 174 1 none Treatment Response [name], [name] ([id number]) Treatment Completion Status: Treatment Completed without Adverse Event Treatment Notes Patient had minor ear  barotrauma at 1.31 and pressure was leveled off. Rate set was decreased from 1.5 to 1.0 and pressure was increased slowly. Patient reached prescribed pressure of 2.0 ATA with no further problems. HBO Attestation I certify that I supervised this HBO treatment in accordance with Medicare guidelines. A trained Yes emergency response team is readily available per hospital policies and procedures. Continue HBOT as ordered. Yes Electronic Signature(s) Signed: 03/21/2015 3:24:31 PM By: Madelaine Bhat MD Entered By: Madelaine Bhat on 03/21/2015 13:15:50 Cobb, [name] ([id number]) -------------------------------------------------------------------------------- HBO Safety Checklist Details Patient Name: [name], [name]. Date of Service: 03/21/2015 10:00 AM Medical Record Patient Account Number: [id number] Number: Treating RN: Mar 15, 1976 (39 y.o. Other Clinician: [name] Date of Birth/Sex: Female) Treating BURNS III, Primary Care Physician: [name] Physician/Extender: Zollie Beckers Referring Physician: [name] Weeks in Treatment: 15 HBO Safety Checklist Items Safety Checklist Consent Form Signed Patient voided / foley secured and emptied When did you last eato 18:00 pm Last dose of injectable or oral agent 18:00 pm NA Ostomy pouch emptied and vented if applicable NA All implantable devices assessed, documented and approved NA Intravenous access site secured and place Valuables secured Linens and cotton and cotton/polyester blend (less than 51% polyester) Personal oil-based products / skin lotions / body lotions removed Wigs or hairpieces removed Smoking or tobacco materials removed Books / newspapers / magazines / loose paper removed Cologne, aftershave, perfume and deodorant removed Jewelry removed (may wrap wedding band) Make-up removed Hair care products removed Battery operated devices (external) removed Heating patches and chemical  warmers removed NA Titanium eyewear removed Nail polish cured greater than 10 hours NA Casting material cured greater than 10 hours NA Hearing aids removed NA Loose dentures or partials removed NA Prosthetics have been removed Patient demonstrates correct use of air break device (if applicable) Patient concerns  have been addressed Patient grounding bracelet on and cord attached to chamber Specifics for Inpatients (complete in addition to above) Medication sheet sent with patient [name], [name]. ([id number]) Intravenous medications needed or due during therapy sent with patient Drainage tubes (e.g. nasogastric tube or chest tube secured and vented) Endotracheal or Tracheotomy tube secured Cuff deflated of air and inflated with saline Airway suctioned Electronic Signature(s) Signed: 03/21/2015 4:07:53 PM By: Dayton Martes RCP, RRT, CHT Entered By: Dayton Martes on 03/21/2015 09:46:37

## 2015-03-22 NOTE — Progress Notes (Signed)
TAMORAH, HADA ([id number]) Visit Report for 03/21/2015 Arrival Information Details Patient Name: [name], [name] Date of Service: 03/21/2015 10:00 AM Medical Record Patient Account Number: [id number] Number: Treating RN: [birth year] (39 y.o. Other Clinician: [name] Date of Birth/Sex: Female) Treating BURNS III, Primary Care Physician: [name] Physician/Extender: Zollie Beckers Referring Physician: [name] Weeks in Treatment: 15 Visit Information History Since Last Visit Added or deleted any medications: No Patient Arrived: Ambulatory Any new allergies or adverse reactions: No Arrival Time: 09:20 Had a fall or experienced change in No Accompanied By: self activities of daily living that may affect Transfer Assistance: None risk of falls: Patient Identification Verified: Yes Signs or symptoms of abuse/neglect No Secondary Verification Process Yes since last visito Completed: Hospitalized since last visit: No Patient Has Alerts: Yes Has Dressing in Place as Prescribed: Yes Patient Alerts: Type II Diabetic Has Footwear/Offloading in Place as Yes 11/30/2014 ABI: Prescribed: (L) 1.24 (R) Left: Surgical Shoe with 0.92 Pressure Relief Insole Right: Surgical Shoe with Pressure Relief Insole Pain Present Now: No Electronic Signature(s) Signed: 03/21/2015 4:07:53 PM By: Dayton Martes RCP, RRT, CHT Entered By: Dayton Martes on 03/21/2015 09:29:37 [name], [name] -------------------------------------------------------------------------------- Encounter Discharge Information Details Patient Name: [name], [name] Date of Service: 03/21/2015 10:00 AM Medical Record Patient Account Number: [id number] Number: Treating RN: 1976/02/02 (39 y.o. Other Clinician: [name] Date of Birth/Sex: Female) Treating BURNS III, Primary Care Physician: [name] Physician/Extender:  Zollie Beckers Referring Physician: [name] Weeks in Treatment: 15 Encounter Discharge Information Items Discharge Pain Level: 0 Discharge Condition: Stable Ambulatory Status: Ambulatory Discharge Destination: Home Private Transportation: Auto Accompanied By: self Schedule Follow-up Appointment: No Medication Reconciliation completed and No provided to Patient/Care Provider: Clinical Summary of Care: Notes Patient has an HBO treatment scheduled on 03/22/15 at 10:00 am. Electronic Signature(s) Signed: 03/21/2015 4:07:53 PM By: Dayton Martes RCP, RRT, CHT Entered By: Dayton Martes on 03/21/2015 11:45:55 [name], [name] -------------------------------------------------------------------------------- Vitals Details Patient Name: [name], [name] Date of Service: 03/21/2015 10:00 AM Medical Record Patient Account Number: [id number] Number: Treating RN: 1975-09-11 (39 y.o. Other Clinician: [name] Date of Birth/Sex: Female) Treating BURNS III, Primary Care Physician: [name] Physician/Extender: Zollie Beckers Referring Physician: [name] Weeks in Treatment: 15 Vital Signs Time Taken: 09:21 Temperature (F): 97.8 Height (in): 67 Pulse (bpm): 84 Weight (lbs): 290 Respiratory Rate (breaths/min): 18 Body Mass Index (BMI): 45.4 Blood Pressure (mmHg): 140/80 Capillary Blood Glucose (mg/dl): 086 Reference Range: 80 - 120 mg / dl Electronic Signature(s) Signed: 03/21/2015 4:07:53 PM By: Dayton Martes RCP, RRT, CHT Entered By: Dayton Martes on 03/21/2015 09:30:06

## 2015-03-23 ENCOUNTER — Encounter: Payer: Medicaid Other | Admitting: Surgery

## 2015-03-23 DIAGNOSIS — L97513 Non-pressure chronic ulcer of other part of right foot with necrosis of muscle: Secondary | ICD-10-CM | POA: Diagnosis not present

## 2015-03-23 LAB — GLUCOSE, CAPILLARY: GLUCOSE-CAPILLARY: 187 mg/dL — AB (ref 65–99)

## 2015-03-23 NOTE — Progress Notes (Addendum)
[name], [name] ([id number]) Visit Report for 03/23/2015 HBO Details Patient Name: [name], [name] 03/23/2015 10:00 Date of Service: AM Medical Record [id number] Number: Patient Account Number: [id number] 31-Aug-1975 (39 y.o. Treating RN: Huel Coventry Date of Birth/Sex: Female) Other Clinician: [name] Primary Care Physician: [name] Treating Britto, Errol Referring Physician: [name] Physician/Extender: [name] in Treatment: 16 HBO Treatment Course Details Treatment Course Ordering Physician: [name] 1 Number: HBO Treatment Start Date: 02/19/2015 Total Treatments 30 Ordered: HBO Indication: Diabetic Ulcer(s) of the Lower Extremity HBO Treatment Details Treatment Treatment Aborted/Not Restarted: Patient Type: Outpatient Number: Patient Choice Chamber #: [id number] Chamber Type: Monoplace Treatment Protocol: 2.0 ATA with 90 minutes oxygen, and no air breaks Treatment Details Compression Rate Down: 1.5 psi / minute De-Compression Rate Up: 1.0 psi / minute Air breaks and breathing Compress Tx Pressure Decompress Decompress periods Begins Reached Begins Ends (leave unused spaces blank) Chamber Pressure 1 ATA 2.0 ATA - - - - - - 2.0 ATA 1 ATA Clock Time (24 hr) 10:04 - - - - - - - 10:12 10:19 Treatment Length: 15 (minutes) Treatment Segments: 0 Capillary Blood Glucose Pre Capillary Blood Glucose (mg/dl): Post Capillary Blood Glucose (mg/dl): Vital Signs Capillary Blood Glucose Reference Range: 80 - 120 mg / dl HBO Diabetic Blood Glucose Intervention Range: <131 mg/dl or >865 mg/dl Time Vitals Blood Respiratory Capillary Blood Glucose Pulse Action Type: Pulse: Temperature: Taken: Pressure: Rate: Glucose (mg/dl): Meter #: Oximetry (%) Taken: Pre 09:40 137/76 80 18 97.6 187 Post 10:35 140/80 80 18 97.8 [name], [name] ([id number]) Treatment Response Treatment Poor Toleration: Treatment Treatment Aborted/Not  Restarted Completion Status: Reason: Patient Choice Treatment Notes Patient's ears were hurting. MD assessed patient and advised patient's ear on left side was reddened. Patient will use nose drops over the weekend and return on Monday to try treatment again. If not successful on Monday. MD will send patient to ENT for evaluation. Electronic Signature(s) Signed: 03/23/2015 12:23:21 PM By: Elliot Gurney RN, BSN, Kim RN, BSN Signed: 03/23/2015 4:17:16 PM By: [name] MD, FACS Previous Signature: 03/23/2015 10:39:35 AM Version By: Elliot Gurney RN, BSN, Kim RN, BSN Entered By: Elliot Gurney, RN, BSN, Kim on 03/23/2015 12:23:21 [name], [name] ([id number]) -------------------------------------------------------------------------------- HBO Safety Checklist Details Patient Name: [name], CRITES. 03/23/2015 10:00 Date of Service: AM Medical Record [id number] Number: Patient Account Number: [id number] Oct 28, 1975 (39 y.o. Treating RN: Huel Coventry Date of Birth/Sex: Female) Other Clinician: [name] Primary Care Physician: [name] Treating Britto, Errol Referring Physician: [name] Physician/Extender: [name] in Treatment: 16 HBO Safety Checklist Items Safety Checklist Consent Form Signed Patient voided / foley secured and emptied When did you last eato 7pm Last dose of injectable or oral agent 7pm NA Ostomy pouch emptied and vented if applicable NA All implantable devices assessed, documented and approved NA Intravenous access site secured and place Valuables secured Linens and cotton and cotton/polyester blend (less than 51% polyester) NA Personal oil-based products / skin lotions / body lotions removed NA Wigs or hairpieces removed Smoking or tobacco materials removed NA Books / newspapers / magazines / loose paper removed NA Cologne, aftershave, perfume and deodorant removed NA Jewelry removed (may wrap wedding band) Make-up removed Hair care products removed NA Battery  operated devices (external) removed NA Heating patches and chemical warmers removed NA Titanium eyewear removed Nail polish cured greater than 10 hours NA Casting material cured greater than 10 hours NA Hearing aids removed NA Loose dentures or partials removed NA Prosthetics have been removed Patient demonstrates  correct use of air break device (if applicable) Patient concerns have been addressed Patient grounding bracelet on and cord attached to chamber Specifics for Inpatients (complete in addition to above) Medication sheet sent with patient [name], [name] ([id number]) Intravenous medications needed or due during therapy sent with patient Drainage tubes (e.g. nasogastric tube or chest tube secured and vented) Endotracheal or Tracheotomy tube secured Cuff deflated of air and inflated with saline Airway suctioned Electronic Signature(s) Signed: 03/23/2015 9:43:08 AM By: Elliot Gurney, RN, BSN, Kim RN, BSN Entered By: Elliot Gurney, RN, BSN, Kim on 03/23/2015 09:43:07

## 2015-03-23 NOTE — Progress Notes (Addendum)
CAMISHA, SREY ([id number]) Visit Report for 03/23/2015 Arrival Information Details Patient Name: SHERETHA, SHADD. Date of Service: 03/23/2015 8:45 AM Medical Record Number: [id number] Patient Account Number: [id number] Date of Birth/Sex: September 21, 1975 (39 y.o. Female) Treating RN: Afful, RN, BSN, [place] Sink Primary Care Physician: [name] Other Clinician: Referring Physician: [name] Treating Physician/Extender: [name] in Treatment: 16 Visit Information History Since Last Visit Added or deleted any medications: No Patient Arrived: Ambulatory Any new allergies or adverse reactions: No Arrival Time: 09:14 Had a fall or experienced change in No Accompanied By: self activities of daily living that may affect Transfer Assistance: None risk of falls: Patient Identification Verified: Yes Signs or symptoms of abuse/neglect since last No Secondary Verification Process Yes visito Completed: Hospitalized since last visit: No Patient Has Alerts: Yes Has Dressing in Place as Prescribed: Yes Patient Alerts: Type II Diabetic Has Footwear/Offloading in Place as Yes 11/30/2014 ABI: Prescribed: (L) 1.24 (R) Left: Wedge 0.92 Shoe Pain Present Now: No Electronic Signature(s) Signed: 03/23/2015 9:15:21 AM By: Elpidio Eric BSN, RN Entered By: Elpidio Eric on 03/23/2015 09:15:21 Danziger, [name] ([id number]) -------------------------------------------------------------------------------- Clinic Level of Care Assessment Details Patient Name: Puett, [name] Date of Service: 03/23/2015 8:45 AM Medical Record Number: [id number] Patient Account Number: [id number] Date of Birth/Sex: [birth year] (39 y.o. Female) Treating RN: Afful, RN, BSN, [place] Sink Primary Care Physician: [name] Other Clinician: Referring Physician: [name] Treating Physician/Extender: [name] in Treatment: 16 Clinic Level of Care Assessment Items TOOL 4 Quantity Score  -  Use when only an EandM is performed on FOLLOW-UP visit 0 ASSESSMENTS - Nursing Assessment / Reassessment X - Reassessment of Co-morbidities (includes updates in patient status) 1 10 X - Reassessment of Adherence to Treatment Plan 1 5 ASSESSMENTS - Wound and Skin Assessment / Reassessment X - Simple Wound Assessment / Reassessment - one wound 1 5  - Complex Wound Assessment / Reassessment - multiple wounds 0  - Dermatologic / Skin Assessment (not related to wound area) 0 ASSESSMENTS - Focused Assessment  - Circumferential Edema Measurements - multi extremities 0  - Nutritional Assessment / Counseling / Intervention 0 X - Lower Extremity Assessment (monofilament, tuning fork, pulses) 1 5  - Peripheral Arterial Disease Assessment (using hand held doppler) 0 ASSESSMENTS - Ostomy and/or Continence Assessment and Care  - Incontinence Assessment and Management 0  - Ostomy Care Assessment and Management (repouching, etc.) 0 PROCESS - Coordination of Care X - Simple Patient / Family Education for ongoing care 1 15  - Complex (extensive) Patient / Family Education for ongoing care 0  - Staff obtains Chiropractor, Records, Test Results / Process Orders 0  - Staff telephones HHA, Nursing Homes / Clarify orders / etc 0  - Routine Transfer to another Facility (non-emergent condition) 0 Lipscomb, Lavone M. ([id number])  - Routine Hospital Admission (non-emergent condition) 0  - New Admissions / Manufacturing engineer / Ordering NPWT, Apligraf, etc. 0  - Emergency Hospital Admission (emergent condition) 0  - Simple Discharge Coordination 0  - Complex (extensive) Discharge Coordination 0 PROCESS - Special Needs  - Pediatric / Minor Patient Management 0  - Isolation Patient Management 0  - Hearing / Language / Visual special needs 0  - Assessment of Community assistance (transportation, D/C planning, etc.) 0  - Additional assistance / Altered mentation 0  -  Support Surface(s) Assessment (bed, cushion, seat, etc.) 0 INTERVENTIONS - Wound Cleansing / Measurement X - Simple Wound Cleansing - one wound 1 5  -  Complex Wound Cleansing - multiple wounds 0 X - Wound Imaging (photographs - any number of wounds) 1 5 []  - Wound Tracing (instead of photographs) 0 X - Simple Wound Measurement - one wound 1 5 []  - Complex Wound Measurement - multiple wounds 0 INTERVENTIONS - Wound Dressings X - Small Wound Dressing one or multiple wounds 1 10 []  - Medium Wound Dressing one or multiple wounds 0 []  - Large Wound Dressing one or multiple wounds 0 []  - Application of Medications - topical 0 []  - Application of Medications - injection 0 INTERVENTIONS - Miscellaneous []  - External ear exam 0 Santoro, Rilla M. ([id number]) []  - Specimen Collection (cultures, biopsies, blood, body fluids, etc.) 0 []  - Specimen(s) / Culture(s) sent or taken to Lab for analysis 0 []  - Patient Transfer (multiple staff / Michiel Sites Lift / Similar devices) 0 []  - Simple Staple / Suture removal (25 or less) 0 []  - Complex Staple / Suture removal (26 or more) 0 []  - Hypo / Hyperglycemic Management (close monitor of Blood Glucose) 0 []  - Ankle / Brachial Index (ABI) - do not check if billed separately 0 X - Vital Signs 1 5 Has the patient been seen at the hospital within the last three years: Yes Total Score: 70 Level Of Care: New/Established - Level 2 Electronic Signature(s) Signed: 03/23/2015 9:36:29 AM By: Elpidio Eric BSN, RN Previous Signature: 03/23/2015 9:30:10 AM Version By: Elpidio Eric BSN, RN Entered By: Elpidio Eric on 03/23/2015 09:36:29 Magallon, [name] ([id number]) -------------------------------------------------------------------------------- Encounter Discharge Information Details Patient Name: Ricklefs, Ramla M. Date of Service: 03/23/2015 8:45 AM Medical Record Number: [id number] Patient Account Number: [id number] Date of Birth/Sex: [birth year] (39 y.o.  Female) Treating RN: Afful, RN, BSN, [place] Sink Primary Care Physician: [name] Other Clinician: Referring Physician: [name] Treating Physician/Extender: [name] in Treatment: 16 Encounter Discharge Information Items Discharge Pain Level: 0 Discharge Condition: Stable Ambulatory Status: Ambulatory Other (Note Discharge Destination: Required) Transportation: Private Auto Accompanied By: self Schedule Follow-up Appointment: No Medication Reconciliation completed and provided to Patient/Care No Provider: Clinical Summary of Care: Notes HBO treatment Electronic Signature(s) Signed: 03/23/2015 9:38:57 AM By: Elpidio Eric BSN, RN Previous Signature: 03/23/2015 9:37:48 AM Version By: Elpidio Eric BSN, RN Entered By: Elpidio Eric on 03/23/2015 09:38:57 Tarry, [name] ([id number]) -------------------------------------------------------------------------------- Lower Extremity Assessment Details Patient Name: Guilbert, Kiylee M. Date of Service: 03/23/2015 8:45 AM Medical Record Number: [id number] Patient Account Number: [id number] Date of Birth/Sex: 04-09-1976 (39 y.o. Female) Treating RN: Afful, RN, BSN, [place] Sink Primary Care Physician: [name] Other Clinician: Referring Physician: [name] Treating Physician/Extender: [name] in Treatment: 16 Vascular Assessment Pulses: Posterior Tibial Dorsalis Pedis Palpable: [Left:Yes] Extremity colors, hair growth, and conditions: Extremity Color: [Left:Normal] Hair Growth on Extremity: [Left:Yes] Temperature of Extremity: [Left:Warm] Capillary Refill: [Left:< 3 seconds] Toe Nail Assessment Left: Right: Thick: Yes Discolored: No Deformed: No Improper Length and Hygiene: No Electronic Signature(s) Signed: 03/23/2015 9:17:33 AM By: Elpidio Eric BSN, RN Entered By: Elpidio Eric on 03/23/2015 09:17:33 Zajkowski, [name]  ([id number]) -------------------------------------------------------------------------------- Multi Wound Chart Details Patient Name: Jillson, Dhanvi M. Date of Service: 03/23/2015 8:45 AM Medical Record Number: [id number] Patient Account Number: [id number] Date of Birth/Sex: [birth year] (39 y.o. Female) Treating RN: Clover Mealy, RN, BSN, [place] Sink Primary Care Physician: [name] Other Clinician: Referring Physician: [name] Treating Physician/Extender: [name] in Treatment: 16 Vital Signs Height(in): 67 Pulse(bpm): 73 Weight(lbs): 290 Blood Pressure 124/77 (mmHg): Body Mass Index(BMI): 45 Temperature(F): 97.6 Respiratory Rate 18 (breaths/min): Photos: [  1:No Photos] [N/A:N/A] Wound Location: [1:Left Foot - Plantar, Lateral N/A] Wounding Event: [1:Gradually Appeared] [N/A:N/A] Primary Etiology: [1:Diabetic Wound/Ulcer of N/A the Lower Extremity] Comorbid History: [1:Type II Diabetes] [N/A:N/A] Date Acquired: [1:11/21/2013] [N/A:N/A] Weeks of Treatment: [1:16] [N/A:N/A] Wound Status: [1:Open] [N/A:N/A] Measurements L x W x D 1x0.6x2.5 [N/A:N/A] (cm) Area (cm) : [1:0.471] [N/A:N/A] Volume (cm) : [1:1.178] [N/A:N/A] % Reduction in Area: [1:79.00%] [N/A:N/A] % Reduction in Volume: 41.70% [N/A:N/A] Classification: [1:Grade 3] [N/A:N/A] Wagner Verification: [1:Abscess] [N/A:N/A] Exudate Amount: [1:Large] [N/A:N/A] Exudate Type: [1:Serosanguineous] [N/A:N/A] Exudate Color: [1:red, brown] [N/A:N/A] Foul Odor After [1:Yes] [N/A:N/A] Cleansing: Odor Anticipated Due to No [N/A:N/A] Product Use: Wound Margin: [1:Thickened] [N/A:N/A] Granulation Amount: [1:Medium (34-66%)] [N/A:N/A] Granulation Quality: [1:Pink] [N/A:N/A] Necrotic Amount: [1:Small (1-33%)] [N/A:N/A] Exposed Structures: [N/A:N/A] Bone: Yes Fascia: No Fat: No Tendon: No Muscle: No Joint: No Epithelialization: Small (1-33%) N/A N/A Periwound Skin Texture: Edema: Yes N/A N/A Callus:  Yes Excoriation: No Induration: No Crepitus: No Fluctuance: No Friable: No Rash: No Scarring: No Periwound Skin Moist: Yes N/A N/A Moisture: Maceration: No Dry/Scaly: No Periwound Skin Color: Atrophie Blanche: No N/A N/A Cyanosis: No Ecchymosis: No Erythema: No Hemosiderin Staining: No Mottled: No Pallor: No Rubor: No Temperature: No Abnormality N/A N/A Tenderness on No N/A N/A Palpation: Wound Preparation: Ulcer Cleansing: N/A N/A Rinsed/Irrigated with Saline Topical Anesthetic Applied: Other: lidocaine 4% Treatment Notes Electronic Signature(s) Signed: 03/23/2015 9:23:55 AM By: Elpidio Eric BSN, RN Entered By: Elpidio Eric on 03/23/2015 09:23:55 Smither, [name] ([id number]) -------------------------------------------------------------------------------- Multi-Disciplinary Care Plan Details Patient Name: Ledvina, [name] Date of Service: 03/23/2015 8:45 AM Medical Record Number: [id number] Patient Account Number: [id number] Date of Birth/Sex: Dec 02, 1975 (39 y.o. 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 Anxiety related to knowledge deficit of hyperbaric oxygen therapy and treatment procedures Discomfort related to temperature and humidity changes inside hyperbaric chamber Potential for barotraumas to ears, sinuses, teeth, and lungs or cerebral gas embolism related to changes in atmospheric pressure inside hyperbaric oxygen chamber Potential for oxygen toxicity seizures related to delivery of 100% oxygen at an increased atmospheric pressure Potential for pulmonary oxygen toxicity related to delivery of 100% oxygen at an increased atmospheric pressure Goals: Barotrauma will  be prevented during HBO2 Date Initiated: 02/15/2015 Goal Status: Active Patient and/or family will be able to state/discuss factors appropriate to the management of their disease process during treatment Date Initiated: 02/15/2015 Goal Status: Active Patient will tolerate the hyperbaric oxygen therapy treatment Date Initiated: 02/15/2015 Goal Status: Active Patient will tolerate the internal climate of the chamber Date Initiated: 02/15/2015 Goal Status: Active Patient/caregiver will verbalize understanding of HBO goals, rationale, procedures and potential hazards Date Initiated: 02/15/2015 Goal Status: Active Signs and symptoms of pulmonary oxygen toxicity will be recognized and promptly addressed Date Initiated: 02/15/2015 Goal Status: Active Signs and symptoms of seizure will be recognized and promptly addressed ; seizing patients will suffer no harm Date Initiated: 02/15/2015 Logue, [name] ([id number]) Goal Status: Active Interventions: Administer a five (5) minute air break for patient if signs and symptoms of seizure appear and notify the hyperbaric physician Administer the correct therapeutic gas delivery based on the patients needs and limitations, per physician order Assess and provide for patientos comfort related to the hyperbaric environment and equalization of middle ear Assess for signs and symptoms related to  adverse events, including but not limited to confinement anxiety, pneumothorax, oxygen toxicity and baurotrauma Assess patient for any history of confinement anxiety Assess patient's knowledge and expectations regarding hyperbaric medicine and provide education related to the hyperbaric environment, goals of treatment and prevention of adverse events Notes: Nutrition Nursing Diagnoses: Potential for alteratiion in Nutrition/Potential for imbalanced nutrition Goals: Patient/caregiver agrees to and verbalizes understanding of need to use nutritional  supplements and/or vitamins as prescribed Date Initiated: 11/30/2014 Goal Status: Active Interventions: Assess patient nutrition upon admission and as needed per policy Notes: Orientation to the Wound Care Program Nursing Diagnoses: Knowledge deficit related to the wound healing center program Goals: Patient/caregiver will verbalize understanding of the Wound Healing Center Program Date Initiated: 11/30/2014 Goal Status: Active Interventions: Provide education on orientation to the wound center Notes: Seefeldt, Sissi M. ([id number]) Peripheral Neuropathy Nursing Diagnoses: Potential alteration in peripheral tissue perfusion (select prior to confirmation of diagnosis) Goals: Patient/caregiver will verbalize understanding of disease process and disease management Date Initiated: 11/30/2014 Goal Status: Active Interventions: Assess signs and symptoms of neuropathy upon admission and as needed Notes: Wound/Skin Impairment Nursing Diagnoses: Impaired tissue integrity Knowledge deficit related to smoking impact on wound healing Goals: Ulcer/skin breakdown will have a volume reduction of 30% by week 4 Date Initiated: 11/30/2014 Goal Status: Active Interventions: Assess ulceration(s) every visit Notes: Electronic Signature(s) Signed: 03/23/2015 9:23:26 AM By: Elpidio Eric BSN, RN Entered By: Elpidio Eric on 03/23/2015 09:23:25 Radich, [name] ([id number]) -------------------------------------------------------------------------------- Pain Assessment Details Patient Name: Steck, Alizandra M. Date of Service: 03/23/2015 8:45 AM Medical Record Number: [id number] Patient Account Number: [id number] Date of Birth/Sex: 07/22/1975 (39 y.o. Female) Treating RN: Clover Mealy, RN, BSN, [place] Sink Primary Care Physician: [name] Other Clinician: Referring Physician: [name] Treating Physician/Extender: [name] in Treatment: 16 Active Problems Location of Pain Severity and  Description of Pain Patient Has Paino No Site Locations Pain Management and Medication Current Pain Management: Electronic Signature(s) Signed: 03/23/2015 9:17:12 AM By: Elpidio Eric BSN, RN Entered By: Elpidio Eric on 03/23/2015 09:17:12 Stecklein, [name] ([id number]) -------------------------------------------------------------------------------- Patient/Caregiver Education Details Patient Name: Baltz, [name] Date of Service: 03/23/2015 8:45 AM Medical Record Number: [id number] Patient Account Number: [id number] Date of Birth/Gender: 11-30-1975 (39 y.o. Female) Treating RN: Afful, RN, BSN, [place] Sink Primary Care Physician: [name] Other Clinician: Referring Physician: [name] Treating Physician/Extender: [name] in Treatment: 16 Education Assessment Education Provided To: Patient Education Topics Provided Welcome To The Wound Care Center: Methods: Explain/Verbal Responses: State content correctly Electronic Signature(s) Signed: 03/23/2015 9:37:58 AM By: Elpidio Eric BSN, RN Entered By: Elpidio Eric on 03/23/2015 09:37:58 Huitron, [name] ([id number]) -------------------------------------------------------------------------------- Wound Assessment Details Patient Name: Ousley, Cheyrl M. Date of Service: 03/23/2015 8:45 AM Medical Record Number: [id number] Patient Account Number: [id number] Date of Birth/Sex: 11-11-1975 (39 y.o. Female) Treating RN: Afful, RN, BSN, [place] Sink Primary Care Physician: [name] Other Clinician: Referring Physician: [name] Treating Physician/Extender: [name] in Treatment: 16 Wound Status Wound Number: 1 Primary Diabetic Wound/Ulcer of the Lower Etiology: Extremity Wound Location: Left, Lateral, Plantar Foot Wound Status: Open Wounding Event: Gradually Appeared Comorbid Type II Diabetes Date Acquired: 11/21/2013 History: Weeks Of Treatment: 16 Clustered Wound: No Wound Measurements Length:  (cm) 1 Width: (cm) 0.6 Depth: (cm) 2.8 Area: (cm) 0.471 Volume: (cm) 1.319 % Reduction in Area: 79% % Reduction in Volume: 34.8% Epithelialization: Small (1-33%) Tunneling: No Undermining: No Wound Description Classification: Grade 3 Wagner Verification: Abscess Wound Margin: Thickened Exudate Amount: Large Exudate Type: Serosanguineous Exudate Color:  red, brown Foul Odor After Cleansing: Yes Due to Product Use: No Wound Bed Granulation Amount: Medium (34-66%) Exposed Structure Granulation Quality: Pink Fascia Exposed: No Necrotic Amount: Small (1-33%) Fat Layer Exposed: No Necrotic Quality: Adherent Slough Tendon Exposed: No Muscle Exposed: No Joint Exposed: No Bone Exposed: Yes Periwound Skin Texture Texture Color No Abnormalities Noted: No No Abnormalities Noted: No Callus: Yes Atrophie Blanche: No Crepitus: No Cyanosis: No Excoriation: No Ecchymosis: No Fluctuance: No Erythema: No Riker, [name] ([id number]) Friable: No Hemosiderin Staining: No Induration: No Mottled: No Localized Edema: Yes Pallor: No Rash: No Rubor: No Scarring: No Temperature / Pain Moisture Temperature: No Abnormality No Abnormalities Noted: No Dry / Scaly: No Maceration: No Moist: Yes Wound Preparation Ulcer Cleansing: Rinsed/Irrigated with Saline Topical Anesthetic Applied: Other: lidocaine 4%, Treatment Notes Wound #1 (Left, Lateral, Plantar Foot) 1. Cleansed with: Clean wound with Normal Saline 4. Dressing Applied: Iodoform packing Gauze 5. Secondary Dressing Applied Foam Gauze and Kerlix/Conform 6. Footwear/Offloading device applied Wedge shoe 7. Secured with Tape Notes Optician, dispensing) Signed: 03/23/2015 2:24:20 PM By: Elpidio Eric BSN, RN Previous Signature: 03/23/2015 9:21:34 AM Version By: Elpidio Eric BSN, RN Entered By: Elpidio Eric on 03/23/2015 09:27:22 Harnois, [name]  ([id number]) -------------------------------------------------------------------------------- Vitals Details Patient Name: Giancola, Sherine M. Date of Service: 03/23/2015 8:45 AM Medical Record Number: [id number] Patient Account Number: [id number] Date of Birth/Sex: [birth year] (39 y.o. Female) Treating RN: Afful, RN, BSN,  Sink Primary Care Physician: [name] Other Clinician: Referring Physician: [name] Treating Physician/Extender: [name] in Treatment: 16 Vital Signs Time Taken: 09:16 Temperature (F): 97.6 Height (in): 67 Pulse (bpm): 73 Weight (lbs): 290 Respiratory Rate (breaths/min): 18 Body Mass Index (BMI): 45.4 Blood Pressure (mmHg): 124/77 Reference Range: 80 - 120 mg / dl Electronic Signature(s) Signed: 03/23/2015 9:18:07 AM By: Elpidio Eric BSN, RN Entered By: Elpidio Eric on 03/23/2015 09:18:07

## 2015-03-23 NOTE — Progress Notes (Signed)
[name], [name] ([id number]) Visit Report for 03/22/2015 HBO Details Patient Name: [name], [name] 03/22/2015 10:00 Date of Service: AM Medical Record [id number] Number: Patient Account Number: [id number] 12-22-1975 (39 y.o. Treating RN: Date of Birth/Sex: Female) Other Clinician: [name] Primary Care Physician: [name] Treating Britto, Errol Referring Physician: [name] Physician/Extender: [name] in Treatment: 16 HBO Treatment Course Details Treatment Course Ordering Physician: [name] 1 Number: HBO Treatment Start Date: 02/19/2015 Total Treatments 30 Ordered: HBO Indication: Diabetic Ulcer(s) of the Lower Extremity HBO Treatment Details Treatment Number: 16 Patient Type: Outpatient Chamber Type: Monoplace Chamber #: [id number] Treatment Protocol: 2.0 ATA with 90 minutes oxygen, and no air breaks Treatment Details Compression Rate Down: 1.5 psi / minute De-Compression Rate Up: 1.5 psi / minute Air breaks and breathing Compress Tx Pressure Decompress Decompress periods Begins Reached Begins Ends (leave unused spaces blank) Chamber Pressure 1 ATA 2.0 ATA - - - - - - 2.0 ATA 1 ATA Clock Time (24 hr) 10:24 10:35 - - - - - - 12:05 12:17 Treatment Length: 113 (minutes) Treatment Segments: 4 Capillary Blood Glucose Pre Capillary Blood Glucose (mg/dl): Post Capillary Blood Glucose (mg/dl): Vital Signs Capillary Blood Glucose Reference Range: 80 - 120 mg / dl HBO Diabetic Blood Glucose Intervention Range: <131 mg/dl or >696 mg/dl Time Vitals Blood Respiratory Capillary Blood Glucose Pulse Action Type: Pulse: Temperature: Taken: Pressure: Rate: Glucose (mg/dl): Meter #: Oximetry (%) Taken: Pre 09:25 140/82 72 18 97.8 252 1 none Post 12:30 110/80 72 18 97.9 210 1 none Treatment Response [name], [name] ([id number]) Treatment Completion Status: Treatment Completed without Adverse Event HBO Attestation I certify that I  supervised this HBO treatment in accordance with Medicare guidelines. A trained Yes emergency response team is readily available per hospital policies and procedures. Continue HBOT as ordered. Yes Electronic Signature(s) Signed: 03/22/2015 4:03:04 PM By: [name] MD, FACS Previous Signature: 03/22/2015 1:15:01 PM Version By: Dayton Martes RCP, RRT, CHT Entered By: [name] on 03/22/2015 16:03:04 [name], [name] ([id number]) -------------------------------------------------------------------------------- HBO Safety Checklist Details Patient Name: [name], [name] 03/22/2015 10:00 Date of Service: AM Medical Record [id number] Number: Patient Account Number: [id number] February 24, 1976 (39 y.o. Treating RN: Date of Birth/Sex: Female) Other Clinician: [name] Primary Care Physician: [name] Treating Britto, Errol Referring Physician: [name] Physician/Extender: [name] in Treatment: 16 HBO Safety Checklist Items Safety Checklist Consent Form Signed Patient voided / foley secured and emptied When did you last eato 20:00 pm on 03/21/15 Last dose of injectable or oral agent 20:00 PM ON 03/21/15 NA Ostomy pouch emptied and vented if applicable NA All implantable devices assessed, documented and approved NA Intravenous access site secured and place Valuables secured Linens and cotton and cotton/polyester blend (less than 51% polyester) Personal oil-based products / skin lotions / body lotions removed Wigs or hairpieces removed Smoking or tobacco materials removed Books / newspapers / magazines / loose paper removed Cologne, aftershave, perfume and deodorant removed Jewelry removed (may wrap wedding band) Make-up removed Hair care products removed Battery operated devices (external) removed Heating patches and chemical warmers removed NA Titanium eyewear removed Nail polish cured greater than 10 hours NA Casting material cured greater  than 10 hours NA Hearing aids removed NA Loose dentures or partials removed NA Prosthetics have been removed Patient demonstrates correct use of air break device (if applicable) Patient concerns have been addressed Patient grounding bracelet on and cord attached to chamber Specifics for Inpatients (complete in addition to above) Medication sheet sent with  patienDAVIAN [name], [name] ([id number]) Intravenous medications needed or due during therapy sent with patient Drainage tubes (e.g. nasogastric tube or chest tube secured and vented) Endotracheal or Tracheotomy tube secured Cuff deflated of air and inflated with saline Airway suctioned Electronic Signature(s) Signed: 03/22/2015 1:15:01 PM By: Dayton Martes RCP, RRT, CHT Entered By: [name], [name] on 03/22/2015 09:49:23

## 2015-03-23 NOTE — Progress Notes (Signed)
[name] Visit Report for 03/23/2015 Arrival Information Details Patient Name: [name], [name] 03/23/2015 10:00 Date of Service: AM Medical Record [id number] Number: Patient Account Number: [id number] 11/05/1975 (39 y.o. Treating RN: Huel Coventry Date of Birth/Sex: Female) Other Clinician: [name] Primary Care Physician: [name] Treating Britto, Errol Referring Physician: [name] Physician/Extender: [name] in Treatment: 16 Visit Information History Since Last Visit Added or deleted any medications: No Patient Arrived: Ambulatory Any new allergies or adverse reactions: No Arrival Time: 09:40 Had a fall or experienced change in No Accompanied By: self activities of daily living that may affect Transfer Assistance: None risk of falls: Patient Identification Verified: Yes Signs or symptoms of abuse/neglect since last No Secondary Verification Process Yes visito Completed: Hospitalized since last visit: No Patient Has Alerts: Yes Has Dressing in Place as Prescribed: Yes Patient Alerts: Type II Diabetic Pain Present Now: No 11/30/2014 ABI: (L) 1.24 (R) 0.92 Electronic Signature(s) Signed: 03/23/2015 9:40:33 AM By: Elliot Gurney, RN, BSN, Kim RN, BSN Entered By: Elliot Gurney, RN, BSN, Kim on 03/23/2015 09:40:33 [name] ([id number]) -------------------------------------------------------------------------------- Encounter Discharge Information Details Patient Name: [name], [name] 03/23/2015 10:00 Date of Service: AM Medical Record [id number] Number: Patient Account Number: [id number] 03-26-1976 (39 y.o. Treating RN: Huel Coventry Date of Birth/Sex: Female) Other Clinician: [name] Primary Care Physician: [name] Treating [name] Referring Physician: [name] Physician/Extender: [name] in Treatment: 16 Encounter Discharge Information Items Discharge Pain Level: 0 Discharge Condition:  Stable Ambulatory Status: Ambulatory Discharge Destination: Home Private Transportation: Auto Accompanied By: self Schedule Follow-up Appointment: Yes Medication Reconciliation completed and Yes provided to Patient/Care Provider: Clinical Summary of Care: Electronic Signature(s) Signed: 03/23/2015 11:19:42 AM By: Elliot Gurney, RN, BSN, Kim RN, BSN Entered By: Elliot Gurney, RN, BSN, Kim on 03/23/2015 11:19:42 [name] ([id number]) -------------------------------------------------------------------------------- Vitals Details Patient Name: [name] 03/23/2015 10:00 Date of Service: AM Medical Record [id number] Number: Patient Account Number: [id number] 1975-10-29 (39 y.o. Treating RN: Huel Coventry Date of Birth/Sex: Female) Other Clinician: [name] Primary Care Physician: [name] Treating Britto, Errol Referring Physician: [name] Physician/Extender: [name] in Treatment: 16 Vital Signs Time Taken: 09:40 Temperature (F): 97.6 Height (in): 67 Pulse (bpm): 80 Weight (lbs): 290 Respiratory Rate (breaths/min): 18 Body Mass Index (BMI): 45.4 Blood Pressure (mmHg): 137/75 Capillary Blood Glucose (mg/dl): 742 Reference Range: 80 - 120 mg / dl Electronic Signature(s) Signed: 03/23/2015 9:41:01 AM By: Elliot Gurney, RN, BSN, Kim RN, BSN Entered By: Elliot Gurney, RN, BSN, Kim on 03/23/2015 09:41:01

## 2015-03-23 NOTE — Progress Notes (Addendum)
[name], [name] ([id number]) Visit Report for 03/23/2015 Chief Complaint Document Details Patient Name: [name], [name] Date of Service: 03/23/2015 8:45 AM Medical Record Patient Account Number: [id number] Number: Afful, RN, BSN, Treating RN: 05/16/1976 (39 y.o. [name] Date of Birth/Sex: Female) Other Clinician: Primary Care Physician: Casilda Carls Treating Christin Fudge Referring Physician: Casilda Carls Physician/Extender: Suella Grove in Treatment: 16 Information Obtained from: Patient Chief Complaint Patients presents for treatment of an open diabetic ulcer. The patient has had an ulcerated wound on her left lower extremity for about 2 years. Electronic Signature(s) Signed: 03/23/2015 9:35:28 AM By: Christin Fudge MD, FACS Entered By: Christin Fudge on 03/23/2015 09:35:28 Garinger, [name] ([id number]) -------------------------------------------------------------------------------- HPI Details Patient Name: [name], [name] Date of Service: 03/23/2015 8:45 AM Medical Record Patient Account Number: [id number] Number: Afful, RN, BSN, Treating RN: September 09, 1975 (39 y.o. [name] Date of Birth/Sex: Female) Other Clinician: Primary Care Physician: Casilda Carls Treating Christin Fudge Referring Physician: Casilda Carls Physician/Extender: Weeks in Treatment: 16 History of Present Illness Location: left foot ulcer Quality: Patient reports No Pain. Severity: Wound is moderately severed with some exposed structure in the wound bed. Duration: Patient has had the wound for > 2 years prior to seeking treatment at the wound center Timing: for the last 2 years she has been treated at the [facility] wound care center Context: The wound appeared gradually over time Modifying Factors: Other treatment(s) tried include:in the last month she has been operated on the left foot for an abscess of that foot and was in hospital for 2 weeks. Associated Signs and Symptoms: Periwound  excoriated and wound draining heavily HPI Description: 39 year old patient who is known to be a diabetic comes from the office of Dr. Rosario Jacks for a ulcer on the left lower extremity and foot. She has had an ulcer on the left foot and has had previous treatment at [facility]. the patient tells [place] that she has been seen as the outpatient center for a long while and has had various tests including MRIs done but she is not a very good historian. Most recently about a month ago she had a abscess drained from her left foot and was in hospital for about 2 weeks and was then discharged with a PICC line and IV antibioticstics which she knows may be vancomycin and Levaquin. She has never had a wound VAC now has she had hyperbaric oxygen therapy. Past medical history significant for dyspnea, hypertension, hyperlipidemia, neuropathy, diabetes type 2 uncontrolled. She smokes cigarettes the latest blood reports are from March 2016 and show that her BMP is essentially normal her lipid panel was also normal her hemoglobin A1c was 9.4. 12/07/2014 -- Her tissue culture is back and she has grown significant amounts of Proteus mirabilis, pseudomonas aeruginosa and Staphylococcus aureus. The staph is a MSSA and all the cultures are sensitive to ciprofloxacin. I will start her on Cipro 500 mg by mouth twice a day for 14 days. Xray left foot -- IMPRESSION:No evidence of osteomyelitis. Healed fracture of the distal left third metatarsal. 12/21/2014 -- she is still awaiting clearance from her insurance company for HBOT. She was seen by Dr. Rosario Jacks was working on controlling her diabetes and she continues to work on giving up smoking. She has completed her dosage of ciprofloxacin and we will be continuing with local care. 01/03/2015 -- her insurance clearance is still not in and we are awaiting this before starting her on a hyperbaric oxygen therapy. 01/09/2015 - we are still awaiting her insurance clearance  and other than  that she is doing fine. 01/26/2015 -- she was unable to tolerate the total contact cast and hence had a Darco shoe back on her left [name], [name] ([id number]) foot. Her blood sugars have been doing better and her last hemoglobin A1c report is pending. He continues to smoke 6-8 cigarettes a day. 02/02/2015 -- yesterday her left leg started getting swollen and has red streaks starting at the foot and going up to her knee. She has had a mild fever and has not gone to see her PCP or [facility] where she normally gets her treatment for this. 02/15/2015 - she has recently been discharged from [facility] after she was admitted for a cellulitis abscess and osteomyelitis of her left foot. We haven't received all her reports yet but as per the patient she was on IV antibiotics for 10 days and then discharged home on Augmentin. Addendum -- was able to look at her records in [place]. Hospital Course: [name] is a 39 y.o. female with PMH of HTN, DM, depression, and tobacco abuse who presented with left diabetic foot ulcer and cellulitis. Left leg cellulitis/Left diabetic foot ulcer with osteomyelitis: The patient presented with worsening pain and green pus drainage from her diabetic foot ulcer and surrounding cellulitis. Most recently, she was hospitalized 4/12-4/19 for this foot wound. She was treated with Vancomycin and Zosyn. ID was consulted and recommended that the patient receive Daptomycin, Ceftazidime, and Flagyl x 2 weeks. She had a PICC line placed prior to discharge. After discharge, she was transitioned to oral antibiotics. Wound cultures have grown GAS, GBS, MSSA, and MRSA.Marland Kitchen She notes that she was last treated with Ciprofloxacin for about 2 weeks and completed this course at least 2 weeks ago. She continues to follow with her wound clinic in [place] weekly. - Obtained MRI LLE which showed osteomyelitis of the left fourth metatarsal head. IMPRESSION:  Findings compatible with acute osteomyelitis of the left fourth metatarsal head, with question of nondisplaced pathologic fracture. Plantar ulcer underlying the fourth MTP joint and diffuse soft tissue edema are noted. - Consulted Vascular Surgery, appreciate assistance. Performed left foot ulcer/bone debridement 8/15, sent bone for culture which showed no growth to date. - Started IV Vancomycin, Ceftaz, and Flagyl on admission. Transitioned to IV Unasyn 8/15. Given negative bone cultures, will transition to oral Augmentin for 2 week course at discharge. - Provided wound care w/ wet to dry packing to left foot wound with Kerlix wrap, change daily. - Per Vascular Surgery, WBAT LLE with offloading shoe. - Provided pain control with Oxycodone PRN, although pain had improved considerably prior to discharge. - Plan for follow up with Wound Clinic in [place] within 1 week after discharge. Arterial Duplex also done -- Final Interpretationo-- Left: Normal examination. No evidence of arterial occlusive disease.o LT Great toe pressure = 82 mmHg. No significant change asoocompared to previous study.o 02/23/2015 -- he has not Been able to see Dr. Ola Spurr and is running out of her Augmentin so we shall extend this. She had a stomach bug yesterday and could not make it to HBO. Continues to smoke but is working on quitting. 03/02/2015 -- she was seen by Dr. Ola Spurr yesterday and he has put on doxycycline and ciprofloxacin for 3 weeks. She continues with hyperbaric oxygen therapy and has been doing fine. 03/23/2015-- note from Dr. Ola Spurr reviewed dated 03/21/2015 -- he has put her on ciprofloxacin 1 tablet twice a day and doxycycline 1 tablet twice a  day for 4 weeks. He has taken fresh cultures and if her ESR and CRP are increasing or she grows resistant organism he will plan to place a PICC line and start on IV Minassian, [name] ([id number]) antibiotics. He also recommends that if the  infection does not heal she may need surgical resection of the metatarsal head and would consider this in about 4 weekso time. She continues to smoke however and I have again urged her to give this up completely. Her blood sugars under control. Electronic Signature(s) Signed: 03/23/2015 9:36:06 AM By: Christin Fudge MD, FACS Previous Signature: 03/23/2015 9:23:34 AM Version By: Christin Fudge MD, FACS Entered By: Christin Fudge on 03/23/2015 09:36:06 Kosier, [name] ([id number]) -------------------------------------------------------------------------------- Physical Exam Details Patient Name: [name], [name] Date of Service: 03/23/2015 8:45 AM Medical Record Patient Account Number: [id number] Number: Afful, RN, BSN, Treating RN: [birth year] (39 y.o. [name] Date of Birth/Sex: Female) Other Clinician: Primary Care Physician: Casilda Carls Treating Christin Fudge Referring Physician: Casilda Carls Physician/Extender: Weeks in Treatment: 16 Constitutional . Pulse regular. Respirations normal and unlabored. Afebrile. . Eyes Nonicteric. Reactive to light. Ears, Nose, Mouth, and Throat Lips, teeth, and gums WNL.Marland Kitchen Moist mucosa without lesions . Neck supple and nontender. No palpable supraclavicular or cervical adenopathy. Normal sized without goiter. Respiratory WNL. No retractions.. Cardiovascular Pedal Pulses WNL. No clubbing, cyanosis or edema. Lymphatic No adneopathy. No adenopathy. No adenopathy. Musculoskeletal Adexa without tenderness or enlargement.. Digits and nails w/o clubbing, cyanosis, infection, petechiae, ischemia, or inflammatory conditions.. Integumentary (Hair, Skin) No suspicious lesions. No crepitus or fluctuance. No peri-wound warmth or erythema. No masses.Marland Kitchen Psychiatric Judgement and insight Intact.. No evidence of depression, anxiety, or agitation.. Notes There is minimal callus and the wound probes down to bone. No debridement has been done  today. There is no cellulitis on the dorsum of the foot. Electronic Signature(s) Signed: 03/23/2015 9:37:10 AM By: Christin Fudge MD, FACS Entered By: Christin Fudge on 03/23/2015 09:37:10 Caudle, [name] ([id number]) -------------------------------------------------------------------------------- Physician Orders Details Patient Name: [name], [name] Date of Service: 03/23/2015 8:45 AM Medical Record Patient Account Number: [id number] Number: Afful, RN, BSN, Treating RN: Apr 09, 1976 (39 y.o. [name] Date of Birth/Sex: Female) Other Clinician: Primary Care Physician: Casilda Carls Treating Christin Fudge Referring Physician: Casilda Carls Physician/Extender: Suella Grove in Treatment: 28 Verbal / Phone Orders: Yes Clinician: Afful, RN, BSN, [name] Read Back and Verified: Yes Diagnosis Coding Wound Cleansing Wound #1 Left,Lateral,Plantar Foot o Clean wound with Normal Saline. Anesthetic Wound #1 Left,Lateral,Plantar Foot o Topical Lidocaine 4% cream applied to wound bed prior to debridement Primary Wound Dressing Wound #1 Left,Lateral,Plantar Foot o Iodoform packing Gauze Secondary Dressing Wound #1 Left,Lateral,Plantar Foot o Boardered Foam Dressing Dressing Change Frequency Wound #1 Left,Lateral,Plantar Foot o Change dressing every other day. Follow-up Appointments Wound #1 Left,Lateral,Plantar Foot o Return Appointment in 1 week. Off-Loading Wound #1 Left,Lateral,Plantar Foot o Open toe surgical shoe to: Additional Orders / Instructions Wound #1 Left,Lateral,Plantar Foot o Stop Smoking o Increase protein intake. [name], [name] ([id number]) Hyperbaric Oxygen Therapy Wound #1 Left,Lateral,Plantar Foot o Evaluate for HBO Therapy o Indication: - Osteomyelitis; Abscess o 2.0 ATA for 90 Minutes without Air Breaks o One treatment per day (delivered Monday through Friday unless otherwise specified in Special Instructions below): o  Total # of Treatments: - 30-40 o Follow Hyperbaric Oxygen Glycemia Protocol Medications-please add to medication list. Wound #1 Left,Lateral,Plantar Foot o P.O. Antibiotics - Doxycycline and Cipro prescribed by Dr. Littie Deeds INTERVENTIONS PROTOCOL PRE-HBO GLYCEMIA INTERVENTIONS ACTION INTERVENTION  Obtain pre-HBO capillary blood 1 glucose (ensure physician order is in chart). A. Notify HBO physician and await physician orders. 2 If result is 70 mg/dl or below: B. If the result meets the hospital definition of a critical result, follow hospital policy. A. Give patient an 8 ounce Glucerna Shake, an 8 ounce Ensure, or 8 ounces of a Glucerna/Ensure equivalent dietary supplement*. B. Wait 30 minutes. If result is 71 mg/dl to 130 mg/dl: C. Retest patientos capillary blood glucose (CBG). D. If result greater than or equal to 110 mg/dl, proceed with HBO. If result less than 110 mg/dl, notify HBO physician and consider holding HBO. If result is 131 mg/dl to 249 mg/dl: A. Proceed with HBO. A. Notify HBO physician and await physician orders. B. It is recommended to hold HBO and do blood/urine ketone If result is 250 mg/dl or greater: testing. C. If the result meets the hospital definition of a critical result, follow hospital policy. POST-HBO GLYCEMIA INTERVENTIONS [name], [name] ([id number]) ACTION INTERVENTION Obtain post HBO capillary blood 1 glucose (ensure physician order is in chart). A. Notify HBO physician and await physician orders. 2 If result is 70 mg/dl or below: B. If the result meets the hospital definition of a critical result, follow hospital policy. A. Give patient an 8 ounce Glucerna Shake, an 8 ounce Ensure, or 8 ounces of a Glucerna/Ensure equivalent dietary supplement*. B. Wait 15 minutes for symptoms of hypoglycemia (i.e. nervousness, anxiety, If result is 71 mg/dl to 100 mg/dl: sweating, chills,  clamminess, irritability, confusion, tachycardia or dizziness). C. If patient asymptomatic, discharge patient. If patient symptomatic, repeat capillary blood glucose (CBG) and notify HBO physician. If result is 101 mg/dl to 249 mg/dl: A. Discharge patient. A. Notify HBO physician and await physician orders. B. It is recommended to do If result is 250 mg/dl or greater: blood/urine ketone testing. C. If the result meets the hospital definition of a critical result, follow hospital policy. *Juice or candies are NOT equivalent products. If patient refuses the Glucerna or Ensure, please consult the hospital dietitian for an appropriate substitute. Electronic Signature(s) Signed: 03/23/2015 9:27:45 AM By: Regan Lemming BSN, RN Signed: 03/23/2015 4:17:16 PM By: Christin Fudge MD, FACS Entered By: Regan Lemming on 03/23/2015 09:27:44 [name], [name] ([id number]) -------------------------------------------------------------------------------- Problem List Details Patient Name: Loconte, Myra [name]. Date of Service: 03/23/2015 8:45 AM Medical Record Patient Account Number: [id number] Number: Afful, RN, BSN, Treating RN: 04/09/1976 (39 y.o. 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 ([id number]) -------------------------------------------------------------------------------- Progress Note Details Patient Name: [name], [name] Date of Service: 03/23/2015 8:45 AM Medical Record Patient Account Number: [id number] Number: Afful, RN, BSN, Treating RN: 03-22-1976 (39 y.o. [name] Date of Birth/Sex: Female) Other Clinician: Primary Care  Physician: Casilda Carls Treating Christin Fudge Referring Physician: Casilda Carls Physician/Extender: Suella Grove in Treatment: 16 Subjective Chief Complaint Information obtained from Patient Patients presents for treatment of an open diabetic ulcer. The patient has had an ulcerated wound on her left lower extremity for about 2 years. History of Present Illness (HPI) The following HPI elements were documented for the patient's wound: Location: left foot ulcer Quality: Patient reports No Pain. Severity: Wound is moderately severed with some exposed structure in the wound bed. Duration: Patient has had the wound for > 2 years prior to seeking treatment at the wound center Timing: for the last 2 years she has been treated at the [facility] wound care center Context: The wound appeared gradually over time Modifying Factors: Other treatment(s) tried include:in the last month she has been operated on the left foot for an abscess of that foot and was in hospital for 2 weeks. Associated Signs and Symptoms: Periwound excoriated and wound draining heavily 39 year old patient who is known to be a diabetic comes from the office of Dr. Rosario Jacks for a ulcer on the left lower extremity and foot. She has had an ulcer on the left foot and has had  previous treatment at [facility]. the patient tells [place] that she has been seen as the outpatient center for a long while and has had various tests including MRIs done but she is not a very good historian. Most recently about a month ago she had a abscess drained from her left foot and was in hospital for about 2 weeks and was then discharged with a PICC line and IV antibioticstics which she knows may be vancomycin and Levaquin. She has never had a wound VAC now has she had hyperbaric oxygen therapy. Past medical history significant for dyspnea, hypertension, hyperlipidemia, neuropathy, diabetes type 2 uncontrolled. She smokes cigarettes the latest blood reports are from March 2016 and show that her BMP is essentially normal her lipid panel was also normal her hemoglobin A1c was 9.4. 12/07/2014 -- Her tissue culture is back and she has grown significant amounts of Proteus mirabilis, pseudomonas aeruginosa and Staphylococcus aureus. The staph is a MSSA and all the cultures are sensitive to ciprofloxacin. I will start her on Cipro 500 mg by mouth twice a day for 14 days. Xray left foot -- IMPRESSION:No evidence of osteomyelitis. Healed fracture of the distal left Kirchman, Cheryll [name]. ([id number]) third metatarsal. 12/21/2014 -- she is still awaiting clearance from her insurance company for HBOT. She was seen by Dr. Rosario Jacks was working on controlling her diabetes and she continues to work on giving up smoking. She has completed her dosage of ciprofloxacin and we will be continuing with local care. 01/03/2015 -- her insurance clearance is still not in and we are awaiting this before starting her on a hyperbaric oxygen therapy. 01/09/2015 - we are still awaiting her insurance clearance and other than that she is doing fine. 01/26/2015 -- she was unable to tolerate the total contact cast and hence had a Darco shoe back on her left foot. Her blood sugars have been doing better and her last hemoglobin A1c  report is pending. He continues to smoke 6-8 cigarettes a day. 02/02/2015 -- yesterday her left leg started getting swollen and has red streaks starting at the foot and going up to her knee. She has had a mild fever and has not gone to see her PCP or [facility] where she normally gets her treatment for this. 02/15/2015 - she has recently been discharged  from [facility] after she was admitted for a cellulitis abscess and osteomyelitis of her left foot. We haven't received all her reports yet but as per the patient she was on IV antibiotics for 10 days and then discharged home on Augmentin. Addendum -- was able to look at her records in [place]. Hospital Course: LATANJA LEHENBAUER is a 39 y.o. female with PMH of HTN, DM, depression, and tobacco abuse who presented with left diabetic foot ulcer and cellulitis. Left leg cellulitis/Left diabetic foot ulcer with osteomyelitis: The patient presented with worsening pain and green pus drainage from her diabetic foot ulcer and surrounding cellulitis. Most recently, she was hospitalized 4/12-4/19 for this foot wound. She was treated with Vancomycin and Zosyn. ID was consulted and recommended that the patient receive Daptomycin, Ceftazidime, and Flagyl x 2 weeks. She had a PICC line placed prior to discharge. After discharge, she was transitioned to oral antibiotics. Wound cultures have grown GAS, GBS, MSSA, and MRSA.Marland Kitchen She notes that she was last treated with Ciprofloxacin for about 2 weeks and completed this course at least 2 weeks ago. She continues to follow with her wound clinic in [place] at [place] weekly. - Obtained MRI LLE which showed osteomyelitis of the left fourth metatarsal head. IMPRESSION: Findings compatible with acute osteomyelitis of the left fourth metatarsal head, with question of nondisplaced pathologic fracture. Plantar ulcer underlying the fourth MTP joint and diffuse soft tissue edema are noted. - Consulted Vascular Surgery,  appreciate assistance. Performed left foot ulcer/bone debridement 8/15, sent bone for culture which showed no growth to date. - Started IV Vancomycin, Ceftaz, and Flagyl on admission. Transitioned to IV Unasyn 8/15. Given negative bone cultures, will transition to oral Augmentin for 2 week course at discharge. - Provided wound care w/ wet to dry packing to left foot wound with Kerlix wrap, change daily. - Per Vascular Surgery, WBAT LLE with offloading shoe. - Provided pain control with Oxycodone PRN, although pain had improved considerably prior to discharge. - Plan for follow up with Wound Clinic in [place] within 1 week after discharge. Arterial Duplex also done -- Final Interpretation -- Left: Normal examination. No evidence of arterial occlusive disease. LT Great toe pressure = 82 mmHg. No significant change as compared to previous study. KEILA, TURAN ([id number]) 02/23/2015 -- he has not Been able to see Dr. Ola Spurr and is running out of her Augmentin so we shall extend this. She had a stomach bug yesterday and could not make it to HBO. Continues to smoke but is working on quitting. 03/02/2015 -- she was seen by Dr. Ola Spurr yesterday and he has put on doxycycline and ciprofloxacin for 3 weeks. She continues with hyperbaric oxygen therapy and has been doing fine. 03/23/2015-- note from Dr. Ola Spurr reviewed dated 03/21/2015 -- he has put her on ciprofloxacin 1 tablet twice a day and doxycycline 1 tablet twice a day for 4 weeks. He has taken fresh cultures and if her ESR and CRP are increasing or she grows resistant organism he will plan to place a PICC line and start on IV antibiotics. He also recommends that if the infection does not heal she may need surgical resection of the metatarsal head and would consider this in about 4 weeks time. She continues to smoke however and I have again urged her to give this up completely. Her blood sugars under  control. Objective Constitutional Pulse regular. Respirations normal and unlabored. Afebrile. Vitals Time Taken: 9:16 AM, Height: 67 in, Weight: 290 lbs, BMI: 45.4, Temperature:  97.6 F, Pulse: 73 bpm, Respiratory Rate: 18 breaths/min, Blood Pressure: 124/77 mmHg. Eyes Nonicteric. Reactive to light. Ears, Nose, Mouth, and Throat Lips, teeth, and gums WNL.Marland Kitchen Moist mucosa without lesions . Neck supple and nontender. No palpable supraclavicular or cervical adenopathy. Normal sized without goiter. Respiratory WNL. No retractions.. Cardiovascular Pedal Pulses WNL. No clubbing, cyanosis or edema. Lymphatic No adneopathy. No adenopathy. No adenopathy. Musculoskeletal Adexa without tenderness or enlargement.. Digits and nails w/o clubbing, cyanosis, infection, petechiae, ischemia, or inflammatory conditions.Marland Kitchen Leever, [name] ([id number]) Psychiatric Judgement and insight Intact.. No evidence of depression, anxiety, or agitation.. General Notes: There is minimal callus and the wound probes down to bone. No debridement has been done today. There is no cellulitis on the dorsum of the foot. Integumentary (Hair, Skin) No suspicious lesions. No crepitus or fluctuance. No peri-wound warmth or erythema. No masses.. Wound #1 status is Open. Original cause of wound was Gradually Appeared. The wound is located on the Coleta. The wound measures 1cm length x 0.6cm width x 2.8cm depth; 0.471cm^2 area and 1.319cm^3 volume. There is bone exposed. There is no tunneling or undermining noted. There is a large amount of serosanguineous drainage noted. The wound margin is thickened. There is medium (34- 66%) pink granulation within the wound bed. There is a small (1-33%) amount of necrotic tissue within the wound bed including Adherent Slough. The periwound skin appearance exhibited: Callus, Localized Edema, Moist. The periwound skin appearance did not exhibit: Crepitus, Excoriation,  Fluctuance, Friable, Induration, Rash, Scarring, Dry/Scaly, Maceration, Atrophie Blanche, Cyanosis, Ecchymosis, Hemosiderin Staining, Mottled, Pallor, Rubor, Erythema. Periwound temperature was noted as No Abnormality. 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 She will also continue with hyperbaric oxygen therapy and this is going to be done on a regular basis. as per discussions with ID if she does not improve clinically and if she has a recurrent infection she may need to get the metatarsal head resected. I have again motivated her to give up smoking completely and also to keep her blood sugars under control. Logie, [name] ([id number]) Plan Wound Cleansing: Wound #1 Left,Lateral,Plantar Foot: Clean wound with Normal Saline. Anesthetic: Wound #1 Left,Lateral,Plantar Foot: Topical Lidocaine 4% cream applied to wound bed prior to debridement Primary Wound Dressing: Wound #1 Left,Lateral,Plantar Foot: Iodoform packing Gauze Secondary Dressing: Wound #1 Left,Lateral,Plantar Foot: Boardered Foam Dressing Dressing Change Frequency: Wound #1 Left,Lateral,Plantar Foot: Change dressing every other day. Follow-up Appointments: Wound #1 Left,Lateral,Plantar Foot: Return Appointment in 1 week. Off-Loading: Wound #1 Left,Lateral,Plantar Foot: Open toe surgical shoe to: Additional Orders / Instructions: Wound #1 Left,Lateral,Plantar  Foot: Stop Smoking Increase protein intake. Hyperbaric Oxygen Therapy: Wound #1 Left,Lateral,Plantar Foot: Evaluate for HBO Therapy Indication: - Osteomyelitis; Abscess 2.0 ATA for 90 Minutes without Air Breaks One treatment per day (delivered Monday through Friday unless otherwise specified in Special Instructions below): Total # of Treatments: - 30-40 Follow Hyperbaric Oxygen Glycemia Protocol Medications-please add to medication list.: Wound #1 Left,Lateral,Plantar Foot: P.O. Antibiotics - Doxycycline and Cipro prescribed by Dr. Ola Spurr  We will continue packing with iodoform gauze and she is on 2 antibiotics are as per ID. She will also continue with hyperbaric oxygen therapy and this is going to be done on a regular basis. Blyden, [name] ([id number]) as per discussions with ID if she does not improve clinically and if she has a recurrent infection she may need to get the metatarsal head resected. I have again motivated her to give up smoking completely and also to keep her blood sugars under control. Electronic Signature(s) Signed: 03/23/2015 9:40:30 AM By: Christin Fudge MD, FACS Entered By: Christin Fudge on 03/23/2015 09:40:30 Hopwood, [name] ([id number]) -------------------------------------------------------------------------------- SuperBill Details Patient Name: Mahnken, Faline [name]. Date of Service: 03/23/2015 Medical Record Patient Account Number: [id number] Number: Afful, RN, BSN, Treating RN: [birth year] (39 y.o. [name] Date of Birth/Sex: Female) Other Clinician: Primary Care Physician: Casilda Carls Treating Britto, Stayton Referring Physician: Casilda Carls Physician/Extender: Suella Grove in Treatment: 16 Diagnosis Coding ICD-10 Codes Code Description E11.621 Type 2 diabetes mellitus with foot ulcer E08.40 Diabetes mellitus due to underlying condition with diabetic neuropathy, unspecified L97.513 Non-pressure chronic ulcer of other part of right  foot with necrosis of muscle F17.218 Nicotine dependence, cigarettes, with other nicotine-induced disorders E66.01 Morbid (severe) obesity due to excess calories L03.116 Cellulitis of left lower limb 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, left ankle and foot Facility Procedures CPT4 Code: [id number] Description: 25053 - WOUND CARE VISIT-LEV 2 EST PT Modifier: Quantity: 1 Physician Procedures CPT4: Description Modifier Quantity Code [id number] - WC PHYS LEVEL 3 - EST PT 1 ICD-10 Description Diagnosis E11.621 Type 2 diabetes mellitus with foot ulcer 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

## 2015-03-26 ENCOUNTER — Encounter: Payer: Medicaid Other | Attending: Surgery | Admitting: Surgery

## 2015-03-26 DIAGNOSIS — F17218 Nicotine dependence, cigarettes, with other nicotine-induced disorders: Secondary | ICD-10-CM | POA: Diagnosis not present

## 2015-03-26 DIAGNOSIS — L97523 Non-pressure chronic ulcer of other part of left foot with necrosis of muscle: Secondary | ICD-10-CM | POA: Insufficient documentation

## 2015-03-26 DIAGNOSIS — E11621 Type 2 diabetes mellitus with foot ulcer: Secondary | ICD-10-CM | POA: Insufficient documentation

## 2015-03-26 DIAGNOSIS — I10 Essential (primary) hypertension: Secondary | ICD-10-CM | POA: Insufficient documentation

## 2015-03-26 DIAGNOSIS — M86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, left ankle and foot: Secondary | ICD-10-CM | POA: Insufficient documentation

## 2015-03-26 DIAGNOSIS — L03116 Cellulitis of left lower limb: Secondary | ICD-10-CM | POA: Diagnosis not present

## 2015-03-26 DIAGNOSIS — E1140 Type 2 diabetes mellitus with diabetic neuropathy, unspecified: Secondary | ICD-10-CM | POA: Diagnosis not present

## 2015-03-26 LAB — GLUCOSE, CAPILLARY: Glucose-Capillary: 153 mg/dL — ABNORMAL HIGH (ref 65–99)

## 2015-03-26 NOTE — Progress Notes (Signed)
KARIAH, LOREDO ([id number]) Visit Report for 03/26/2015 Arrival Information Details Patient Name: [name], [name] Date of Service: 03/26/2015 1:00 PM Medical Record Number: [id number] Patient Account Number: [id number] Date of Birth/Sex: Jun 19, 1976 (39 y.o. Female) Treating RN: Primary Care Physician: [name] Other Clinician: [name] Referring Physician: [name] Treating Physician/Extender: [name] in Treatment: 16 Visit Information History Since Last Visit Added or deleted any medications: No Patient Arrived: Ambulatory Any new allergies or adverse No Arrival Time: 12:30 reactions: Accompanied By: self Had a fall or experienced change in No Transfer Assistance: None activities of daily living that may affect Patient Identification Verified: Yes risk of falls: Secondary Verification Process Yes Hospitalized since last visit: No Completed: Has Dressing in Place as Prescribed: Yes Patient Has Alerts: Yes Has Footwear/Offloading in Place as Yes Patient Alerts: Type II Diabetic Prescribed: 11/30/2014 ABI: Left: Surgical Shoe with (L) 1.24 (R) Pressure Relief Insole 0.92 Right: Surgical Shoe with Pressure Relief Insole Pain Present Now: No Electronic Signature(s) Signed: 03/26/2015 1:57:39 PM By: Dayton Martes RCP, RRT, CHT Entered By: Dayton Martes on 03/26/2015 12:49:47 Keesling, [name] ([id number]) -------------------------------------------------------------------------------- Vitals Details Patient Name: [name], [name] Date of Service: 03/26/2015 1:00 PM Medical Record Number: [id number] Patient Account Number: [id number] Date of Birth/Sex: 17-Nov-1975 (39 y.o. Female) Treating RN: Primary Care Physician: [name] Other Clinician: [name] Referring Physician: [name] Treating Physician/Extender: [name] in Treatment: 16 Vital Signs Time Taken:  12:33 Temperature (F): 97.9 Height (in): 67 Pulse (bpm): 66 Weight (lbs): 290 Respiratory Rate (breaths/min): 18 Body Mass Index (BMI): 45.4 Blood Pressure (mmHg): 134/82 Capillary Blood Glucose (mg/dl): 629 Reference Range: 80 - 120 mg / dl Electronic Signature(s) Signed: 03/26/2015 1:57:39 PM By: Dayton Martes RCP, RRT, CHT Entered By: Dayton Martes on 03/26/2015 12:50:21

## 2015-03-27 ENCOUNTER — Encounter: Payer: Medicaid Other | Admitting: Surgery

## 2015-03-27 NOTE — Progress Notes (Signed)
[name], [name] ([id number]) Visit Report for 03/26/2015 HBO Details Patient Name: [name], [name] Date of Service: 03/26/2015 1:00 PM Medical Record Number: [id number] Patient Account Number: [id number] Date of Birth/Sex: 1975/10/18 (39 y.o. Female) Treating RN: Primary Care Physician: [name] Other Clinician: [name] Referring Physician: [name] Treating Physician/Extender: [name] in Treatment: 16 HBO Treatment Course Details Treatment Course Ordering Physician: [name] 1 Number: HBO Treatment Start Date: 02/19/2015 Total Treatments 30 Ordered: HBO Indication: Diabetic Ulcer(s) of the Lower Extremity HBO Treatment Details Treatment Treatment Aborted/Not Restarted: Patient Type: Outpatient Number: Adverse Event Chamber #: [id number] Chamber Type: Monoplace Treatment Protocol: 2.0 ATA with 90 minutes oxygen, and no air breaks Treatment Details Compression Rate Down: 1.5 psi / minute De-Compression Rate Up: 1.5 psi / minute Air breaks and breathing Compress Tx Pressure Decompress Decompress periods Begins Reached Begins Ends (leave unused spaces blank) Chamber Pressure 1 ATA 2.0 ATA - - - - - - 2.0 ATA 1 ATA Clock Time (24 hr) 12:41 - - - - - - - - 12:53 Treatment Length: 12 (minutes) Treatment Segments: 0 Capillary Blood Glucose Pre Capillary Blood Glucose (mg/dl): Post Capillary Blood Glucose (mg/dl): Vital Signs Capillary Blood Glucose Reference Range: 80 - 120 mg / dl HBO Diabetic Blood Glucose Intervention Range: <131 mg/dl or >865 mg/dl Time Vitals Blood Respiratory Capillary Blood Glucose Pulse Action Type: Pulse: Temperature: Taken: Pressure: Rate: Glucose (mg/dl): Meter #: Oximetry (%) Taken: Pre 12:33 134/82 66 18 97.9 153 1 none Treatment Response Adverse Events: Kaczmarczyk, [name] ([id number]) 1:Barotrauma - Ear Treatment Completion Status: Treatment Aborted/Not Restarted Reason: Adverse  Event Treatment Notes Patient complained of ear and sinus pressure and pain again today beginning at 1.31 ATA. Pressure was leveled off and an attempt to increase pressure very slowly was made but the pain would not subside. Patient was brought to the surface and released from the chamber after a total of 12 minutes. Electronic Signature(s) Signed: 03/26/2015 1:57:39 PM By: Sallee Provencal, RRT, CHT Signed: 03/26/2015 4:36:30 PM By: [name] MD, FACS Entered By: Dayton Martes on 03/26/2015 13:57:14 Tillett, [name] ([id number]) -------------------------------------------------------------------------------- HBO Safety Checklist Details Patient Name: [name], [name] Date of Service: 03/26/2015 1:00 PM Medical Record Number: [id number] Patient Account Number: [id number] Date of Birth/Sex: 07-03-1975 (39 y.o. Female) Treating RN: Primary Care Physician: [name] Other Clinician: [name] Referring Physician: [name] Treating Physician/Extender: [name] in Treatment: 16 HBO Safety Checklist Items Safety Checklist Consent Form Signed Patient voided / foley secured and emptied When did you last eato 08:00 am Last dose of injectable or oral agent 08:00 am NA Ostomy pouch emptied and vented if applicable NA All implantable devices assessed, documented and approved NA Intravenous access site secured and place Valuables secured Linens and cotton and cotton/polyester blend (less than 51% polyester) Personal oil-based products / skin lotions / body lotions removed Wigs or hairpieces removed Smoking or tobacco materials removed Books / newspapers / magazines / loose paper removed Cologne, aftershave, perfume and deodorant removed Jewelry removed (may wrap wedding band) Make-up removed Hair care products removed Battery operated devices (external) removed Heating patches and chemical warmers removed NA Titanium  eyewear removed Nail polish cured greater than 10 hours NA Casting material cured greater than 10 hours NA Hearing aids removed NA Loose dentures or partials removed NA Prosthetics have been removed Patient demonstrates correct use of air break device (if applicable) Patient concerns have been addressed Patient grounding bracelet on and  cord attached to chamber Specifics for Inpatients (complete in addition to above) Medication sheet sent with patient Intravenous medications needed or due during therapy sent with patient Magro, KHYLIE LARMORE. ([id number]) Drainage tubes (e.g. nasogastric tube or chest tube secured and vented) Endotracheal or Tracheotomy tube secured Cuff deflated of air and inflated with saline Airway suctioned Electronic Signature(s) Signed: 03/26/2015 1:57:39 PM By: Dayton Martes RCP, RRT, CHT Entered By: Dayton Martes on 03/26/2015 12:51:13

## 2015-03-28 ENCOUNTER — Encounter: Payer: Medicaid Other | Admitting: Surgery

## 2015-03-29 ENCOUNTER — Ambulatory Visit
Admission: RE | Admit: 2015-03-29 | Discharge: 2015-03-29 | Disposition: A | Discharge status: Home / Self Care | Payer: Medicaid Other | Source: Ambulatory Visit | Attending: Vascular Surgery | Admitting: Vascular Surgery

## 2015-03-29 ENCOUNTER — Encounter: Payer: Medicaid Other | Admitting: General Surgery

## 2015-03-29 ENCOUNTER — Ambulatory Visit (HOSPITAL_BASED_OUTPATIENT_CLINIC_OR_DEPARTMENT_OTHER): Payer: Medicaid Other | Admitting: General Surgery

## 2015-03-29 ENCOUNTER — Encounter
Admission: RE | Disposition: A | Discharge status: Home / Self Care | Payer: Self-pay | Source: Ambulatory Visit | Attending: Vascular Surgery

## 2015-03-29 DIAGNOSIS — E11621 Type 2 diabetes mellitus with foot ulcer: Secondary | ICD-10-CM

## 2015-03-29 DIAGNOSIS — E11628 Type 2 diabetes mellitus with other skin complications: Secondary | ICD-10-CM | POA: Diagnosis not present

## 2015-03-29 DIAGNOSIS — L97529 Non-pressure chronic ulcer of other part of left foot with unspecified severity: Secondary | ICD-10-CM

## 2015-03-29 DIAGNOSIS — L089 Local infection of the skin and subcutaneous tissue, unspecified: Secondary | ICD-10-CM | POA: Insufficient documentation

## 2015-03-29 HISTORY — PX: PERIPHERAL VASCULAR CATHETERIZATION: SHX172C

## 2015-03-29 SURGERY — PICC LINE INSERTION
Anesthesia: Moderate Sedation

## 2015-03-29 SURGICAL SUPPLY — 3 items
SHEATH PEELAWAY 6F MICRO (SHEATH) ×3 IMPLANT
TRAY PICC SGL PWR INJ 4FR 55CM (TRAY / TRAY PROCEDURE) ×3 IMPLANT
WIRE FLEX T 018X145 (WIRE) ×3 IMPLANT

## 2015-03-29 NOTE — Op Note (Signed)
[facility]   OPERATIVE NOTE    PRE-OPERATIVE DIAGNOSIS: diabetic foot infection  POST-OPERATIVE DIAGNOSIS: diabetic foot infection  PROCEDURE:  1. Ultrasound guidance for vascular access to right basilic vein 2. Fluoroscopic guidance for placement of catheter 3. Insertion of peripherally inserted central venous catheter to the right basilic vein  SURGEON: Festus Barren, MD  ANESTHESIA: local  ESTIMATED BLOOD LOSS: minimal  FINDING(S): 1.  none  SPECIMEN(S):  none  INDICATIONS:   [name] is a 39 y.o. female who presents with a diabetic foot infection. We are asked to place a PICC line for long term IV access.  Risks and benefits are discussed, and informed consent was obtained.  DESCRIPTION: After obtaining full informed written consent, the patient was brought back to the vascular suite.  The patient's  right arm was sterilely prepped and draped, and a sterile surgical field was created. The right basilic vein was accessed under direct ultrasound guidance without difficulty with a micropuncture needle and a permanent image was recorded. A 0.018 wire was then placed into the superior vena cava. Peel-away sheath was placed over the wire. A peripherally inserted central venous catheter was then placed over the wire and the wire and peel-away sheath were removed. The catheter tip was placed into the superior vena cava and was secured at the skin at 36 cm with a sterile dressing. The catheter withdrew blood well and flushed easily with heparinized saline. The patient tolerated the procedure well without complications.  COMPLICATIONS: none  CONDITION: stable  [name]  03/29/2015, 2:21 PM

## 2015-03-29 NOTE — H&P (Signed)
[facility] Admission History & Physical  MRN : [id number]  [name] is a 39 y.o. (November 21, 1975) female who presents with chief complaint of No chief complaint on file. Marland Kitchen  History of Present Illness: Patient with 39 year old left foot wound.  Getting hyperbaric oxygen therapy by the [facility].  Needs IV access for long term IV ABx  No current facility-administered medications for this encounter.    PMH: diabetes Chronic LE wounds   PSH: two previous PICC lines  Social History Social History  Substance Use Topics  . Smoking status: Not on file  . Smokeless tobacco: Not on file  . Alcohol Use: Not on file  No IVDU. Lives at home. Married  Family History No bleeding disorders, clotting disorders, or autoimmune diseases  Not on File   REVIEW OF SYSTEMS (Negative unless checked)  Constitutional: [] Weight loss  [] Fever  [] Chills Cardiac: [] Chest pain   [] Chest pressure   [] Palpitations   [] Shortness of breath when laying flat   [] Shortness of breath at rest   [] Shortness of breath with exertion. Vascular:  [] Pain in legs with walking   [] Pain in legs at rest   [] Pain in legs when laying flat   [] Claudication   [] Pain in feet when walking  [] Pain in feet at rest  [] Pain in feet when laying flat   [] History of DVT   [] Phlebitis   [] Swelling in legs   [] Varicose veins   [x] Non-healing ulcers Pulmonary:   [] Uses home oxygen   [] Productive cough   [] Hemoptysis   [] Wheeze  [] COPD   [] Asthma Neurologic:  [] Dizziness  [] Blackouts   [] Seizures   [] History of stroke   [] History of TIA  [] Aphasia   [] Temporary blindness   [] Dysphagia   [] Weakness or numbness in arms   [] Weakness or numbness in legs Musculoskeletal:  [] Arthritis   [] Joint swelling   [] Joint pain   [] Low back pain Hematologic:  [] Easy bruising  [] Easy bleeding   [] Hypercoagulable state   [] Anemic  [] Hepatitis Gastrointestinal:  [] Blood in stool   [] Vomiting blood  [] Gastroesophageal  reflux/heartburn   [] Difficulty swallowing. Genitourinary:  [] Chronic kidney disease   [] Difficult urination  [] Frequent urination  [] Burning with urination   [] Blood in urine Skin:  [] Rashes   [x] Ulcers   [x] Wounds Psychological:  [] History of anxiety   []  History of major depression.  Physical Examination  There were no vitals filed for this visit. There is no height or weight on file to calculate BMI. Gen: WD/WN, NAD Head: Speed/AT, No temporalis wasting. Prominent temp pulse not noted. Ear/Nose/Throat: Hearing grossly intact, nares w/o erythema or drainage, oropharynx w/o Erythema/Exudate,  Eyes: PERRLA, EOMI.  Neck: Supple, no nuchal rigidity.  No bruit or JVD.  Pulmonary:  Good air movement, clear to auscultation bilaterally, no use of accessory muscles.  Cardiac: RRR, normal S1, S2, no Murmurs, rubs or gallops. Vascular:  Vessel Right Left  Radial Palpable Palpable  Ulnar Palpable Palpable  Brachial Palpable Palpable  Carotid Palpable, without bruit Palpable, without bruit  Aorta Not palpable N/A  Femoral Palpable Palpable  Popliteal Palpable Palpable  PT Palpable Palpable  DP Palpable Palpable   Gastrointestinal: soft, non-tender/non-distended. No guarding/reflex.  Musculoskeletal: M/S 5/5 throughout.  Extremities without ischemic changes.  No deformity or atrophy.  Neurologic: CN 2-12 intact. Pain and light touch intact in extremities.  Symmetrical.  Speech is fluent. Motor exam as listed above. Psychiatric: Judgment intact, Mood & affect appropriate for pt's clinical situation.  Dermatologic: Left foot wound  Lymph : No Cervical, Axillary, or Inguinal lymphadenopathy.      CBC Lab Results  Component Value Date   WBC 13.0* 10/31/2012   HGB 15.6 10/31/2012   HCT 44.3 10/31/2012   MCV 91 10/31/2012   PLT 304 10/31/2012    BMET    Component Value Date/Time   NA 132* 10/31/2012 1928   NA 136 01/20/2008 0657   K 3.8 10/31/2012 1928   K 3.8 SLIGHT HEMOLYSIS  01/20/2008 0657   CL 100 10/31/2012 1928   CL 107 01/20/2008 0657   CO2 23 10/31/2012 1928   CO2 23 01/20/2008 0657   GLUCOSE 411* 10/31/2012 1928   GLUCOSE 191* 01/20/2008 0657   BUN 8 10/31/2012 1928   BUN 8 01/20/2008 0657   CREATININE 0.51* 10/31/2012 1928   CREATININE 0.70 01/20/2008 0657   CALCIUM 9.4 10/31/2012 1928   CALCIUM 9.1 01/20/2008 0657   GFRNONAA >60 10/31/2012 1928   GFRNONAA >60 01/20/2008 0657   GFRAA >60 10/31/2012 1928   GFRAA  01/20/2008 0657    >60        The eGFR has been calculated using the MDRD equation. This calculation has not been validated in all clinical   CrCl cannot be calculated (Unknown ideal weight.).  COAG No results found for: INR, PROTIME  Radiology No results found.    Assessment/Plan 1. Diabetic foot infection in need of IV ABx. PICC line placed today. Continue to follow with wound care center for wound care. Will receive outpatient IV antibiotics   [name] Krehbiel, MD  03/29/2015 2:13 PM

## 2015-03-29 NOTE — Progress Notes (Signed)
seeiheal 

## 2015-03-30 ENCOUNTER — Encounter: Payer: Self-pay | Admitting: Vascular Surgery

## 2015-03-30 ENCOUNTER — Ambulatory Visit: Payer: Medicaid Other | Admitting: Surgery

## 2015-04-02 ENCOUNTER — Encounter: Payer: Medicaid Other | Admitting: General Surgery

## 2015-04-03 ENCOUNTER — Encounter: Payer: Medicaid Other | Admitting: Surgery

## 2015-04-03 NOTE — Progress Notes (Addendum)
[name], [name] ([id number]) Visit Report for 03/29/2015 Chief Complaint Document Details Patient Name: [name] Date of Service: 03/29/2015 3:00 PM Medical Record Number: [id number] Patient Account Number: [id number] Date of Birth/Sex: [birth year] (39 y.o. Female) Treating RN: Montey Hora Primary Care Physician: Casilda Carls Other Clinician: Referring Physician: Casilda Carls Treating Physician/Extender: Frann Rider in Treatment: 17 Information Obtained from: Patient Chief Complaint Patients presents for treatment of an open diabetic ulcer. The patient has had an ulcerated wound on her left lower extremity for about 2 years. Electronic Signature(s) Signed: 03/29/2015 5:11:41 PM By: Judene Companion MD Signed: 04/03/2015 8:06:26 AM By: Christin Fudge MD, FACS Entered By: Judene Companion on 03/29/2015 15:38:21 Senna, Vilinda Blanks ([id number]) -------------------------------------------------------------------------------- Debridement Details Patient Name: [name], [name] Date of Service: 03/29/2015 3:00 PM Medical Record Number: [id number] Patient Account Number: [id number] Date of Birth/Sex: 07-02-1975 (39 y.o. Female) Treating RN: Montey Hora Primary Care Physician: Casilda Carls Other Clinician: Referring Physician: Casilda Carls Treating Physician/Extender: Frann Rider in Treatment: 17 Debridement Performed for Wound #1 Left,Lateral,Plantar Foot Assessment: Performed By: Physician Christin Fudge, MD Debridement: Debridement Pre-procedure Yes Verification/Time Out Taken: Start Time: 15:14 Pain Control: Lidocaine 4% Topical Solution Level: Skin/Subcutaneous Tissue Total Area Debrided (L x 1 (cm) x 0.6 (cm) = 0.6 (cm) W): Tissue and other Viable, Non-Viable, Callus material debrided: Instrument: Forceps, Scissors Bleeding: Minimum Hemostasis Achieved: Pressure End Time: 15:16 Procedural Pain: 0 Post Procedural Pain: 0 Response to  Treatment: Procedure was tolerated well Post Debridement Measurements of Total Wound Length: (cm) 1 Width: (cm) 0.6 Depth: (cm) 1.9 Volume: (cm) 0.895 Post Procedure Diagnosis Same as Pre-procedure Electronic Signature(s) Signed: 03/29/2015 4:56:28 PM By: Montey Hora Signed: 04/03/2015 8:06:26 AM By: Christin Fudge MD, FACS Entered By: Montey Hora on 03/29/2015 15:16:09 Nickerson, Vilinda Blanks ([id number]) -------------------------------------------------------------------------------- HPI Details Patient Name: [name], [name] Date of Service: 03/29/2015 3:00 PM Medical Record Number: [id number] Patient Account Number: [id number] Date of Birth/Sex: August 02, 1975 (39 y.o. Female) Treating RN: Montey Hora Primary Care Physician: Casilda Carls Other Clinician: Referring Physician: Casilda Carls Treating Physician/Extender: Frann Rider in Treatment: 17 History of Present Illness Location: left foot ulcer Quality: Patient reports No Pain. Severity: Wound is moderately severed with some exposed structure in the wound bed. Duration: Patient has had the wound for > 2 years prior to seeking treatment at the wound center Timing: for the last 2 years she has been treated at the [facility] wound care center Context: The wound appeared gradually over time Modifying Factors: Other treatment(s) tried include:in the last month she has been operated on the left foot for an abscess of that foot and was in hospital for 2 weeks. Associated Signs and Symptoms: Periwound excoriated and wound draining heavily HPI Description: 39 year old patient who is known to be a diabetic comes from the office of Dr. Rosario Jacks for a ulcer on the left lower extremity and foot. She has had an ulcer on the left foot and has had previous treatment at [facility]. the patient tells [place] that she has been seen as the outpatient center for a long while and has had various tests including MRIs done but she is not a very good  historian. Most recently about a month ago she had a abscess drained from her left foot and was in hospital for about 2 weeks and was then discharged with a PICC line and IV antibioticstics which she knows may be vancomycin and Levaquin. She has never had a wound VAC now has she had hyperbaric oxygen  therapy. Past medical history significant for dyspnea, hypertension, hyperlipidemia, neuropathy, diabetes type 2 uncontrolled. She smokes cigarettes the latest blood reports are from March 2016 and show that her BMP is essentially normal her lipid panel was also normal her hemoglobin A1c was 9.4. 12/07/2014 -- Her tissue culture is back and she has grown significant amounts of Proteus mirabilis, pseudomonas aeruginosa and Staphylococcus aureus. The staph is a MSSA and all the cultures are sensitive to ciprofloxacin. I will start her on Cipro 500 mg by mouth twice a day for 14 days. Xray left foot -- IMPRESSION:No evidence of osteomyelitis. Healed fracture of the distal left third metatarsal. 12/21/2014 -- she is still awaiting clearance from her insurance company for HBOT. She was seen by Dr. Rosario Jacks was working on controlling her diabetes and she continues to work on giving up smoking. She has completed her dosage of ciprofloxacin and we will be continuing with local care. 01/03/2015 -- her insurance clearance is still not in and we are awaiting this before starting her on a hyperbaric oxygen therapy. 01/09/2015 - we are still awaiting her insurance clearance and other than that she is doing fine. 01/26/2015 -- she was unable to tolerate the total contact cast and hence had a Darco shoe back on her left foot. Her blood sugars have been doing better and her last hemoglobin A1c report is pending. He continues to smoke 6-8 cigarettes a day. [name] ([id number]) 02/02/2015 -- yesterday her left leg started getting swollen and has red streaks starting at the foot and going up to her knee.  She has had a mild fever and has not gone to see her PCP or [facility] where she normally gets her treatment for this. 02/15/2015 - she has recently been discharged from [facility] after she was admitted for a cellulitis abscess and osteomyelitis of her left foot. We haven't received all her reports yet but as per the patient she was on IV antibiotics for 10 days and then discharged home on Augmentin. Addendum -- was able to look at her records in [place]. Hospital Course: JONISHA KINDIG is a 39 y.o. female with PMH of HTN, DM, depression, and tobacco abuse who presented with left diabetic foot ulcer and cellulitis. Left leg cellulitis/Left diabetic foot ulcer with osteomyelitis: The patient presented with worsening pain and green pus drainage from her diabetic foot ulcer and surrounding cellulitis. Most recently, she was hospitalized 4/12-4/19 for this foot wound. She was treated with Vancomycin and Zosyn. ID was consulted and recommended that the patient receive Daptomycin, Ceftazidime, and Flagyl x 2 weeks. She had a PICC line placed prior to discharge. After discharge, she was transitioned to oral antibiotics. Wound cultures have grown GAS, GBS, MSSA, and MRSA.Marland Kitchen She notes that she was last treated with Ciprofloxacin for about 2 weeks and completed this course at least 2 weeks ago. She continues to follow with her wound clinic in [place] weekly. - Obtained MRI LLE which showed osteomyelitis of the left fourth metatarsal head. IMPRESSION: Findings compatible with acute osteomyelitis of the left fourth metatarsal head, with question of nondisplaced pathologic fracture. Plantar ulcer underlying the fourth MTP joint and diffuse soft tissue edema are noted. - Consulted Vascular Surgery, appreciate assistance. Performed left foot ulcer/bone debridement 8/15, sent bone for culture which showed no growth to date. - Started IV Vancomycin, Ceftaz, and Flagyl on admission. Transitioned to  IV Unasyn 8/15. Given negative bone cultures, will transition to oral Augmentin for 2 week course  at discharge. - Provided wound care w/ wet to dry packing to left foot wound with Kerlix wrap, change daily. - Per Vascular Surgery, WBAT LLE with offloading shoe. - Provided pain control with Oxycodone PRN, although pain had improved considerably prior to discharge. - Plan for follow up with Wound Clinic in [place] within 1 week after discharge. Arterial Duplex also done -- Final Interpretationo-- Left: Normal examination. No evidence of arterial occlusive disease.o LT Great toe pressure = 82 mmHg. No significant change asoocompared to previous study.o 02/23/2015 -- he has not Been able to see Dr. Ola Spurr and is running out of her Augmentin so we shall extend this. She had a stomach bug yesterday and could not make it to [facility]. Continues to smoke but is working on quitting. 03/02/2015 -- she was seen by Dr. Ola Spurr yesterday and he has put on doxycycline and ciprofloxacin for 3 weeks. She continues with hyperbaric oxygen therapy and has been doing fine. 03/23/2015-- note from Dr. Ola Spurr reviewed dated 03/21/2015 -- he has put her on ciprofloxacin 1 tablet twice a day and doxycycline 1 tablet twice a day for 4 weeks. He has taken fresh cultures and if her ESR and CRP are increasing or she grows resistant organism he will plan to place a PICC line and start on IV antibiotics. He also recommends that if the infection does not heal she may need surgical resection of the metatarsal head and would consider this in about 4 weekso time. Pellum, Vilinda Blanks ([id number]) She continues to smoke however and I have again urged her to give this up completely. Her blood sugars under control. Electronic Signature(s) Signed: 03/29/2015 5:11:41 PM By: Judene Companion MD Signed: 04/03/2015 8:06:26 AM By: Christin Fudge MD, FACS Entered By: Judene Companion on 03/29/2015 15:38:49 Hartley, Vilinda Blanks  ([id number]) -------------------------------------------------------------------------------- Physical Exam Details Patient Name: Musselman, Kamber M. Date of Service: 03/29/2015 3:00 PM Medical Record Number: [id number] Patient Account Number: [id number] Date of Birth/Sex: 09-Aug-1975 (39 y.o. Female) Treating RN: Montey Hora Primary Care Physician: Casilda Carls Other Clinician: Referring Physician: Casilda Carls Treating Physician/Extender: Frann Rider in Treatment: 17 Notes DFU left foot WAG 3. Probably osteo. On antibiotics. TOday debrided into fat and muscle. Dress with iodoform daily. Electronic Signature(s) Signed: 03/29/2015 5:11:41 PM By: Judene Companion MD Signed: 04/03/2015 8:06:26 AM By: Christin Fudge MD, FACS Entered By: Judene Companion on 03/29/2015 15:41:23 Mathison, Vilinda Blanks ([id number]) -------------------------------------------------------------------------------- Physician Orders Details Patient Name: Harrell, Litzi M. Date of Service: 03/29/2015 3:00 PM Medical Record Number: [id number] Patient Account Number: [id number] Date of Birth/Sex: [birth year] (39 y.o. Female) Treating RN: Montey Hora Primary Care Physician: Casilda Carls Other Clinician: Referring Physician: Casilda Carls Treating Physician/Extender: Benjaman Pott in Treatment: 63 Verbal / Phone Orders: Yes Clinician: Montey Hora Read Back and Verified: Yes Diagnosis Coding ICD-10 Coding Code Description E11.621 Type 2 diabetes mellitus with foot ulcer E08.40 Diabetes mellitus due to underlying condition with diabetic neuropathy, unspecified L97.513 Non-pressure chronic ulcer of other part of right foot with necrosis of muscle F17.218 Nicotine dependence, cigarettes, with other nicotine-induced disorders E66.01 Morbid (severe) obesity due to excess calories L03.116 Cellulitis of left lower limb 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 Anesthetic Wound #1 Left,Lateral,Plantar Foot o Topical Lidocaine 4% cream applied to wound bed prior to debridement Primary Wound Dressing Wound #1 Left,Lateral,Plantar Foot o Iodoform packing Gauze Secondary Dressing Wound #1 Left,Lateral,Plantar Foot o Gauze and Kerlix/Conform Dressing Change  Frequency Wound #1 Left,Lateral,Plantar Foot o Change dressing every other day. Follow-up Appointments Wound #1 Left,Lateral,Plantar Foot o Return Appointment in 1 week. Alcock, Vilinda Blanks ([id number]) Off-Loading Wound #1 Left,Lateral,Plantar Foot o Open toe surgical shoe to: Additional Orders / Instructions Wound #1 Left,Lateral,Plantar Foot o Stop Smoking o Increase protein intake. Hyperbaric Oxygen Therapy Wound #1 Left,Lateral,Plantar Foot o Evaluate for HBO Therapy o Indication: - Osteomyelitis; Abscess o 2.0 ATA for 90 Minutes without Air Breaks o One treatment per day (delivered Monday through Friday unless otherwise specified in Special Instructions below): o Total # of Treatments: - 30-40 o Follow Hyperbaric Oxygen Glycemia Protocol Medications-please add to medication list. Wound #1 Left,Lateral,Plantar Foot o P.O. Antibiotics - Doxycycline and Cipro prescribed by Dr. Ola Spurr o Other: - to start IV abx asap - PICC line placed today at [place] pre-HBO capillary blood 1 glucose (ensure physician order is in chart). A. Notify HBO physician and await physician orders. 2 If result is 70 mg/dl or below: B. If the result meets the hospital definition of a critical result, follow hospital policy. If result is 71 mg/dl to 130 mg/dl: A. Give patient an 8 ounce Glucerna Shake, an 8 ounce Ensure, or 8 ounces of a Glucerna/Ensure equivalent dietary supplement*. B. Wait  30 minutes. C. Retest patientos capillary blood glucose (CBG). D. If result greater than or equal to 110 mg/dl, proceed with HBO. If result less than 110 mg/dl, Dalton, Asma Jerilynn Mages. ([id number]) notify HBO physician and consider holding HBO. If result is 131 mg/dl to 249 mg/dl: A. Proceed with HBO. A. Notify HBO physician and await physician orders. B. It is recommended to hold HBO and do blood/urine ketone If result is 250 mg/dl or greater: testing. C. If the result meets the hospital definition of a critical result, follow hospital policy. POST-HBO GLYCEMIA INTERVENTIONS ACTION INTERVENTION Obtain post HBO capillary blood 1 glucose (ensure physician order is in chart). A. Notify HBO physician and await physician orders. 2 If result is 70 mg/dl or below: B. If the result meets the hospital definition of a critical result, follow hospital policy. A. Give patient an 8 ounce Glucerna Shake, an 8 ounce Ensure, or 8 ounces of a Glucerna/Ensure equivalent dietary supplement*. B. Wait 15 minutes for symptoms of hypoglycemia (i.e. nervousness, anxiety, If result is 71 mg/dl to 100 mg/dl: sweating, chills, clamminess, irritability, confusion, tachycardia or dizziness). C. If patient asymptomatic, discharge patient. If patient symptomatic, repeat capillary blood glucose (CBG) and notify HBO physician. If result is 101 mg/dl to 249 mg/dl: A. Discharge patient. A. Notify HBO physician and await physician orders. B. It is recommended to do If result is 250 mg/dl or greater: blood/urine ketone testing. C. If the result meets the hospital definition of a critical result, follow hospital policy. *Juice or candies are NOT equivalent products. If patient refuses the Glucerna or Ensure, please consult the hospital dietitian for an appropriate substitute. Electronic Signature(s) Leitzel, AUBRIA VANECEK ([id number]) Signed: 04/03/2015 9:42:15 AM By: Judene Companion MD Previous  Signature: 03/29/2015 4:56:28 PM Version By: Montey Hora Previous Signature: 04/03/2015 8:06:26 AM Version By: Christin Fudge MD, FACS Entered By: Judene Companion on 04/03/2015 09:42:15 Schoffstall, Vilinda Blanks ([id number]) -------------------------------------------------------------------------------- Problem List Details Patient Name: Roldan, Inaara M. Date of Service: 03/29/2015 3:00 PM Medical Record Number: [id number] Patient Account Number: [id number] Date of Birth/Sex: 31-Aug-1975 (39 y.o. 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 with 11/30/2014 Yes necrosis of muscle F17.218 Nicotine dependence, cigarettes, with other nicotine- 11/30/2014 Yes induced disorders E66.01 Morbid (severe) obesity due to excess calories 11/30/2014 Yes L03.116 Cellulitis of left lower limb 02/02/2015 Yes 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 ([id number]) -------------------------------------------------------------------------------- Progress Note Details Patient Name: Mccubbins, Charitie M. Date of Service: 03/29/2015 3:00 PM Medical Record Number: [id number] Patient Account Number: [id number] Date of Birth/Sex: February 08, 1976 (39 y.o. Female) Treating RN:  Montey Hora Primary Care Physician: Casilda Carls Other Clinician: Referring Physician: Casilda Carls Treating Physician/Extender: Frann Rider in Treatment: 17 Subjective Chief Complaint Information obtained from Patient Patients presents for treatment of an open diabetic ulcer. The patient has had an ulcerated wound on her left lower extremity for about 2 years. History of Present Illness (HPI) The following HPI elements were documented for the patient's wound: Location: left foot ulcer Quality: Patient reports No Pain. Severity: Wound is moderately severed with some exposed structure in the wound bed. Duration: Patient has had the wound for > 2 years prior to seeking treatment at the wound center Timing: for the last 2 years she has been treated at the [facility] wound care center Context: The wound appeared gradually over time Modifying Factors: Other treatment(s) tried include:in the last month she has been operated on the left foot for an abscess of that foot and was in hospital for 2 weeks. Associated Signs and Symptoms: Periwound excoriated and wound draining heavily 39 year old patient who is known to be a diabetic comes from the office of Dr. Rosario Jacks for a ulcer on the left lower extremity and foot. She has had an ulcer on the left foot and has had previous treatment at [facility]. the patient tells [place] that she has been seen as the outpatient center for a long while and has had various tests including MRIs done but she is not a very good historian. Most recently about a month ago she had a abscess drained from her left foot and was in hospital for about 2 weeks and was then discharged with a PICC line and IV antibioticstics which she knows may be vancomycin and Levaquin. She has never had a wound VAC now has she had hyperbaric oxygen therapy. Past medical history significant for dyspnea, hypertension, hyperlipidemia, neuropathy, diabetes type 2 uncontrolled. She smokes  cigarettes the latest blood reports are from March 2016 and show that her BMP is essentially normal her lipid panel was also normal her hemoglobin A1c was 9.4. 12/07/2014 -- Her tissue culture is back and she has grown significant amounts of Proteus mirabilis, pseudomonas aeruginosa and Staphylococcus aureus. The staph is a MSSA and all the cultures are sensitive to ciprofloxacin. I will start her on Cipro 500 mg by mouth twice a day for 14 days. Xray left foot -- IMPRESSION:No evidence of osteomyelitis. Healed fracture of the distal left third metatarsal. Rayner, Vilinda Blanks ([id number]) 12/21/2014 -- she is still awaiting clearance from her insurance company for HBOT. She was seen by Dr. Rosario Jacks was  working on controlling her diabetes and she continues to work on giving up smoking. She has completed her dosage of ciprofloxacin and we will be continuing with local care. 01/03/2015 -- her insurance clearance is still not in and we are awaiting this before starting her on a hyperbaric oxygen therapy. 01/09/2015 - we are still awaiting her insurance clearance and other than that she is doing fine. 01/26/2015 -- she was unable to tolerate the total contact cast and hence had a Darco shoe back on her left foot. Her blood sugars have been doing better and her last hemoglobin A1c report is pending. He continues to smoke 6-8 cigarettes a day. 02/02/2015 -- yesterday her left leg started getting swollen and has red streaks starting at the foot and going up to her knee. She has had a mild fever and has not gone to see her PCP or [facility] where she normally gets her treatment for this. 02/15/2015 - she has recently been discharged from [facility] after she was admitted for a cellulitis abscess and osteomyelitis of her left foot. We haven't received all her reports yet but as per the patient she was on IV antibiotics for 10 days and then discharged home on Augmentin. Addendum -- was able to  look at her records in [place]. Hospital Course: JAHMIYA GUIDOTTI is a 38 y.o. female with PMH of HTN, DM, depression, and tobacco abuse who presented with left diabetic foot ulcer and cellulitis. Left leg cellulitis/Left diabetic foot ulcer with osteomyelitis: The patient presented with worsening pain and green pus drainage from her diabetic foot ulcer and surrounding cellulitis. Most recently, she was hospitalized 4/12-4/19 for this foot wound. She was treated with Vancomycin and Zosyn. ID was consulted and recommended that the patient receive Daptomycin, Ceftazidime, and Flagyl x 2 weeks. She had a PICC line placed prior to discharge. After discharge, she was transitioned to oral antibiotics. Wound cultures have grown GAS, GBS, MSSA, and MRSA.Marland Kitchen She notes that she was last treated with Ciprofloxacin for about 2 weeks and completed this course at least 2 weeks ago. She continues to follow with her wound clinic in [place] weekly. - Obtained MRI LLE which showed osteomyelitis of the left fourth metatarsal head. IMPRESSION: Findings compatible with acute osteomyelitis of the left fourth metatarsal head, with question of nondisplaced pathologic fracture. Plantar ulcer underlying the fourth MTP joint and diffuse soft tissue edema are noted. - Consulted Vascular Surgery, appreciate assistance. Performed left foot ulcer/bone debridement 8/15, sent bone for culture which showed no growth to date. - Started IV Vancomycin, Ceftaz, and Flagyl on admission. Transitioned to IV Unasyn 8/15. Given negative bone cultures, will transition to oral Augmentin for 2 week course at discharge. - Provided wound care w/ wet to dry packing to left foot wound with Kerlix wrap, change daily. - Per Vascular Surgery, WBAT LLE with offloading shoe. - Provided pain control with Oxycodone PRN, although pain had improved considerably prior to discharge. - Plan for follow up with Wound Clinic in [place] within 1 week after  discharge. Arterial Duplex also done -- Final Interpretation -- Left: Normal examination. No evidence of arterial occlusive disease. LT Great toe pressure = 82 mmHg. No significant change as compared to previous study. 02/23/2015 -- he has not Been able to see Dr. Ola Spurr and is running out of her Augmentin so we shall extend this. She had a stomach bug yesterday and could not make it to HBO. Continues to smoke but is Krajewski, [employer]  M. ([id number]) working on quitting. 03/02/2015 -- she was seen by Dr. Ola Spurr yesterday and he has put on doxycycline and ciprofloxacin for 3 weeks. She continues with hyperbaric oxygen therapy and has been doing fine. 03/23/2015-- note from Dr. Ola Spurr reviewed dated 03/21/2015 -- he has put her on ciprofloxacin 1 tablet twice a day and doxycycline 1 tablet twice a day for 4 weeks. He has taken fresh cultures and if her ESR and CRP are increasing or she grows resistant organism he will plan to place a PICC line and start on IV antibiotics. He also recommends that if the infection does not heal she may need surgical resection of the metatarsal head and would consider this in about 4 weeks time. She continues to smoke however and I have again urged her to give this up completely. Her blood sugars under control. Objective Constitutional Vitals Time Taken: 2:51 PM, Height: 67 in, Weight: 290 lbs, BMI: 45.4, Temperature: 98.2 F, Pulse: 95 bpm, Respiratory Rate: 16 breaths/min, Blood Pressure: 145/84 mmHg. Integumentary (Hair, Skin) Wound #1 status is Open. Original cause of wound was Gradually Appeared. The wound is located on the [place]. The wound measures 1cm length x 0.6cm width x 1.9cm depth; 0.471cm^2 area and 0.895cm^3 volume. There is bone exposed. There is no tunneling or undermining noted. There is a large amount of serosanguineous drainage noted. The wound margin is thickened. There is medium (34- 66%) pink granulation  within the wound bed. There is a small (1-33%) amount of necrotic tissue within the wound bed including Adherent Slough. The periwound skin appearance exhibited: Callus, Localized Edema, Maceration, Moist. The periwound skin appearance did not exhibit: Crepitus, Excoriation, Fluctuance, Friable, Induration, Rash, Scarring, Dry/Scaly, Atrophie Blanche, Cyanosis, Ecchymosis, Hemosiderin Staining, Mottled, Pallor, Rubor, Erythema. Periwound temperature was noted as No Abnormality. 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 There was a Skin/Subcutaneous Tissue Debridement ([id number]) debridement with total area of 0.6 sq cm performed by Christin Fudge, MD. with the following instrument(s): Forceps and Scissors to remove Viable and Non-Viable tissue/material including Callus after achieving pain control using Lidocaine 4% Topical Solution. A time out was conducted prior to the start of the procedure. A Minimum amount of bleeding was controlled with Pressure. The procedure was tolerated well with a pain level of 0 throughout and a pain level of 0 following the procedure. Post Debridement Measurements: 1cm length x 0.6cm width x 1.9cm depth; 0.895cm^3 volume. Post procedure Diagnosis Wound #1: Same as Pre-Procedure Deep wound 1 cm size plantar left foot. Probable osteo. On antibiotics.  Today debrided deep into muscle of necrotic material. Dress with iodoform daily. Off load Plan Wound Cleansing: Wound #1 Left,Lateral,Plantar Foot: Clean wound with Normal Saline. Anesthetic: Wound #1 Left,Lateral,Plantar Foot: Topical Lidocaine 4% cream applied to wound bed prior to debridement Primary Wound Dressing: Wound #1 Left,Lateral,Plantar Foot: Iodoform packing Gauze Secondary Dressing: Wound #1 Left,Lateral,Plantar Foot: Gauze and Kerlix/Conform Dressing Change Frequency: Wound #1 Left,Lateral,Plantar Foot: Change dressing every other day. Follow-up  Appointments: Wound #1 Left,Lateral,Plantar Foot: Return Appointment in 1 week. Clack, Vilinda Blanks ([id number]) Off-Loading: Wound #1 Left,Lateral,Plantar Foot: Open toe surgical shoe to: Additional Orders / Instructions: Wound #1 Left,Lateral,Plantar Foot: Stop Smoking Increase protein intake. Hyperbaric Oxygen Therapy: Wound #1 Left,Lateral,Plantar Foot: Evaluate for HBO Therapy Indication: - Osteomyelitis; Abscess 2.0 ATA for 90 Minutes without Air Breaks One treatment per day (delivered Monday through Friday unless otherwise specified in Special Instructions below): Total # of Treatments: - 30-40 Follow Hyperbaric Oxygen Glycemia Protocol Medications-please add to medication list.: Wound #1 Left,Lateral,Plantar Foot: P.O. Antibiotics - Doxycycline and Cipro prescribed by Dr. Ola Spurr Other: - to start IV abx asap - PICC line placed today at [facility]: A follow-up appointment should be scheduled. A Patient Clinical Summary of Care was provided to [facility] Electronic Signature(s) Signed: 03/29/2015 5:11:41 PM By: Judene Companion MD Signed: 04/03/2015 8:06:26 AM By: Christin Fudge MD, FACS Entered By: Judene Companion on 03/29/2015 15:44:50 Bramblett, Vilinda Blanks ([id number]) -------------------------------------------------------------------------------- SuperBill Details Patient Name: Briski,  Ahnesti M. Date of Service: 03/29/2015 Medical Record Number: [id number] Patient Account Number: [id number] Date of Birth/Sex: 11/23/1975 (39 y.o. Female) Treating RN: Montey Hora Primary Care Physician: Casilda Carls Other Clinician: Referring Physician: Casilda Carls Treating Physician/Extender: Frann Rider in Treatment: 17 Diagnosis Coding ICD-10 Codes Code Description E11.621 Type 2 diabetes mellitus with foot ulcer E08.40 Diabetes mellitus due to underlying condition with diabetic neuropathy, unspecified L97.513 Non-pressure chronic ulcer of other part of right foot with necrosis of muscle F17.218 Nicotine dependence, cigarettes, with other nicotine-induced disorders E66.01 Morbid (severe) obesity due to excess calories L03.116 Cellulitis of left lower limb 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, left ankle and foot Facility Procedures CPT4: Description Modifier Quantity Code 07225750 11042 - DEB SUBQ TISSUE 20 SQ CM/< 1 ICD-10 Description Diagnosis L97.513 Non-pressure chronic ulcer of other part of right foot with necrosis of muscle Physician Procedures CPT4: Description Modifier Quantity Code 5183358 25189 - WC PHYS LEVEL 3 - EST PT 1 ICD-10 Description Diagnosis E11.621 Type 2 diabetes mellitus with foot ulcer CPT4: 8421031 11042 - WC PHYS SUBQ TISS 20 SQ CM 1 ICD-10 Description Diagnosis L97.513 Non-pressure chronic ulcer of other part of right foot with necrosis of muscle Guay, Vilinda Blanks ([id number]) Electronic Signature(s) Signed: 03/29/2015 5:11:41 PM By: Judene Companion MD Signed: 04/03/2015 8:06:26 AM By: Christin Fudge MD, FACS Entered By: Judene Companion on 03/29/2015 15:45:40

## 2015-04-03 NOTE — Progress Notes (Signed)
[name], [name] ([id number]) Visit Report for [name] Arrival Information Details Patient Name: [name], [name] Date of Service: [name] 3:00 PM Medical Record Number: [id number] Patient Account Number: [id number] Date of Birth/Sex: 08-Jun-1976 (39 y.o. Female) Treating RN: Curtis Sites Primary Care Physician: [name] Other Clinician: Referring Physician: [name] Treating Physician/Extender: [name] in Treatment: 17 Visit Information History Since Last Visit Added or deleted any medications: No Patient Arrived: Ambulatory Any new allergies or adverse reactions: No Arrival Time: 14:48 Had a fall or experienced change in No Accompanied By: self activities of daily living that may affect Transfer Assistance: None risk of falls: Patient Identification Verified: Yes Signs or symptoms of abuse/neglect since last No Secondary Verification Process Yes visito Completed: Hospitalized since last visit: No Patient Has Alerts: Yes Pain Present Now: No Patient Alerts: Type II Diabetic 11/30/2014 ABI: (L) 1.24 (R) 0.92 Electronic Signature(s) Signed: 03/29/2015 4:56:28 PM By: Curtis Sites Entered By: Curtis Sites on 03/29/2015 14:48:45 [name], [name] ([id number]) -------------------------------------------------------------------------------- Encounter Discharge Information Details Patient Name: [name], [name] 3:00 PM Medical Record Number: [id number] Patient Account Number: [id number] Date of Birth/Sex: 1976-06-21 (39 y.o. Female) Treating RN: Curtis Sites Primary Care Physician: [name] Other Clinician: Referring Physician: [name] Treating Physician/Extender: [name] in Treatment: 34 Encounter Discharge Information Items Discharge Pain Level: 0 Discharge Condition: Stable Ambulatory Status: Ambulatory Discharge Destination: Home Transportation: Private Auto Accompanied  By: son Schedule Follow-up Appointment: Yes Medication Reconciliation completed and provided to Patient/Care No Aislee Landgren: Provided on Clinical Summary of Care: [name] Form Type Recipient Paper Patient LS Electronic Signature(s) Signed: 03/29/2015 5:11:41 PM By: [name] MD Previous Signature: [name] 3:28:56 PM Version By: Gwenlyn Perking Entered By: [name] on 03/29/2015 15:46:10 Isola, [name] ([id number]) -------------------------------------------------------------------------------- Lower Extremity Assessment Details Patient Name: [name], [name] 3:00 PM Medical Record Number: [id number] Patient Account Number: [id number] Date of Birth/Sex: [birth year] (39 y.o. Female) Treating RN: Curtis Sites Primary Care Physician: [name] Other Clinician: Referring Physician: [name] Treating Physician/Extender: [name] in Treatment: 17 Vascular Assessment Pulses: Posterior Tibial Dorsalis Pedis Palpable: [Left:Yes] Extremity colors, hair growth, and conditions: Extremity Color: [Left:Normal] Hair Growth on Extremity: [Left:Yes] Temperature of Extremity: [Left:Warm] Capillary Refill: [Left:< 3 seconds] Toe Nail Assessment Left: Right: Thick: Yes Discolored: Yes Deformed: Yes Improper Length and Hygiene: No Electronic Signature(s) Signed: 03/29/2015 4:56:28 PM By: Curtis Sites Entered By: Curtis Sites on 03/29/2015 14:54:08 [name], [name] ([id number]) -------------------------------------------------------------------------------- Multi Wound Chart Details Patient Name: [name], [name] 3:00 PM Medical Record Number: [id number] Patient Account Number: [id number] Date of Birth/Sex: 01/16/1976 (39 y.o. Female) Treating RN: Curtis Sites Primary Care Physician: [name] Other Clinician: Referring Physician: [name] Treating Physician/Extender:  [name] in Treatment: 17 Vital Signs Height(in): 67 Pulse(bpm): 95 Weight(lbs): 290 Blood Pressure 145/84 (mmHg): Body Mass Index(BMI): 45 Temperature(F): 98.2 Respiratory Rate 16 (breaths/min): Photos: [1:No Photos] [N/A:N/A] Wound Location: [1:Left Foot - Plantar, Lateral N/A] Wounding Event: [1:Gradually Appeared] [N/A:N/A] Primary Etiology: [1:Diabetic Wound/Ulcer of N/A the Lower Extremity] Comorbid History: [1:Type II Diabetes] [N/A:N/A] Date Acquired: [1:11/21/2013] [N/A:N/A] Weeks of Treatment: [1:17] [N/A:N/A] Wound Status: [1:Open] [N/A:N/A] Measurements L x W x D 1x0.6x1.9 [N/A:N/A] (cm) Area (cm) : [1:0.471] [N/A:N/A] Volume (cm) : [1:0.895] [N/A:N/A] % Reduction in Area: [1:79.00%] [N/A:N/A] % Reduction in Volume: 55.70% [N/A:N/A] Classification: [1:Grade 3] [N/A:N/A] Wagner Verification: [1:Abscess] [N/A:N/A] Exudate Amount: [1:Large] [N/A:N/A] Exudate Type: [1:Serosanguineous] [N/A:N/A] Exudate Color: [1:red, brown] [  N/A:N/A] Foul Odor After [1:Yes] [N/A:N/A] Cleansing: Odor Anticipated Due to No [N/A:N/A] Product Use: Wound Margin: [1:Thickened] [N/A:N/A] Granulation Amount: [1:Medium (34-66%)] [N/A:N/A] Granulation Quality: [1:Pink] [N/A:N/A] Necrotic Amount: [1:Small (1-33%)] [N/A:N/A] Exposed Structures: [N/A:N/A] Bone: Yes Fascia: No Fat: No Tendon: No Muscle: No Joint: No Epithelialization: Small (1-33%) N/A N/A Periwound Skin Texture: Edema: Yes N/A N/A Callus: Yes Excoriation: No Induration: No Crepitus: No Fluctuance: No Friable: No Rash: No Scarring: No Periwound Skin Maceration: Yes N/A N/A Moisture: Moist: Yes Dry/Scaly: No Periwound Skin Color: Atrophie Blanche: No N/A N/A Cyanosis: No Ecchymosis: No Erythema: No Hemosiderin Staining: No Mottled: No Pallor: No Rubor: No Temperature: No Abnormality N/A N/A Tenderness on No N/A N/A Palpation: Wound Preparation: Ulcer Cleansing: N/A N/A Rinsed/Irrigated  with Saline Topical Anesthetic Applied: Other: lidocaine 4% Treatment Notes Electronic Signature(s) Signed: 03/29/2015 4:56:28 PM By: Curtis Sites Entered By: Curtis Sites on 03/29/2015 14:57:50 Dickerson, [name] ([id number]) -------------------------------------------------------------------------------- Multi-Disciplinary Care Plan Details Patient Name: [name], [name] 3:00 PM Medical Record Number: [id number] Patient Account Number: [id number] Date of Birth/Sex: 1975-08-09 (39 y.o. Female) Treating RN: Curtis Sites Primary Care Physician: [name] Other Clinician: Referring Physician: [name] Treating Physician/Extender: [name] in Treatment: 17 Active Inactive HBO Nursing Diagnoses: Anxiety related to feelings of confinement associated with the hyperbaric oxygen chamber Anxiety related to knowledge deficit of hyperbaric oxygen therapy and treatment procedures Discomfort related to temperature and humidity changes inside hyperbaric chamber Potential for barotraumas to ears, sinuses, teeth, and lungs or cerebral gas embolism related to changes in atmospheric pressure inside hyperbaric oxygen chamber Potential for oxygen toxicity seizures related to delivery of 100% oxygen at an increased atmospheric pressure Potential for pulmonary oxygen toxicity related to delivery of 100% oxygen at an increased atmospheric pressure Goals: Barotrauma will be prevented during HBO2 Date Initiated: 02/15/2015 Goal Status: Active Patient and/or family will be able to state/discuss factors appropriate to the management of their disease process during treatment Date Initiated: 02/15/2015 Goal Status: Active Patient will tolerate the hyperbaric oxygen therapy treatment Date Initiated: 02/15/2015 Goal Status: Active Patient will tolerate the internal climate of the chamber Date Initiated: 02/15/2015 Goal Status:  Active Patient/caregiver will verbalize understanding of HBO goals, rationale, procedures and potential hazards Date Initiated: 02/15/2015 Goal Status: Active Signs and symptoms of pulmonary oxygen toxicity will be recognized and promptly addressed Date Initiated: 02/15/2015 Goal Status: Active Signs and symptoms of seizure will be recognized and promptly addressed ; seizing patients will suffer no harm Date Initiated: 02/15/2015 [name], [name] Goal Status: Active Interventions: Administer a five (5) minute air break for patient if signs and symptoms of seizure appear and notify the hyperbaric physician Administer the correct therapeutic gas delivery based on the patients needs and limitations, per physician order Assess and provide for patientos comfort related to the hyperbaric environment and equalization of middle ear Assess for signs and symptoms related to adverse events, including but not limited to confinement anxiety, pneumothorax, oxygen toxicity and baurotrauma Assess patient for any history of confinement anxiety Assess patient's knowledge and expectations regarding hyperbaric medicine and provide education related to the hyperbaric environment, goals of treatment and prevention of adverse events Notes: Nutrition Nursing Diagnoses: Potential for alteratiion in Nutrition/Potential for imbalanced nutrition Goals: Patient/caregiver agrees to and verbalizes understanding of need to use nutritional supplements and/or vitamins as prescribed Date Initiated: 11/30/2014 Goal Status: Active Interventions: Assess patient nutrition upon admission and as needed per policy Notes: Orientation to the Wound Care Program  Nursing Diagnoses: Knowledge deficit related to the wound healing center program Goals: Patient/caregiver will verbalize understanding of the Wound Healing Center Program Date Initiated: 11/30/2014 Goal Status: Active Interventions: Provide education  on orientation to the wound center Notes: [name], [name] ([id number]) Peripheral Neuropathy Nursing Diagnoses: Potential alteration in peripheral tissue perfusion (select prior to confirmation of diagnosis) Goals: Patient/caregiver will verbalize understanding of disease process and disease management Date Initiated: 11/30/2014 Goal Status: Active Interventions: Assess signs and symptoms of neuropathy upon admission and as needed Notes: Wound/Skin Impairment Nursing Diagnoses: Impaired tissue integrity Knowledge deficit related to smoking impact on wound healing Goals: Ulcer/skin breakdown will have a volume reduction of 30% by week 4 Date Initiated: 11/30/2014 Goal Status: Active Interventions: Assess ulceration(s) every visit Notes: Electronic Signature(s) Signed: 03/29/2015 4:56:28 PM By: Curtis Sites Entered By: Curtis Sites on 03/29/2015 14:57:33 [name], [name] -------------------------------------------------------------------------------- Patient/Caregiver Education Details Patient Name: [name], [name] Date of Service: [name] 3:00 PM Medical Record Number: [id number] Patient Account Number: [id number] Date of Birth/Gender: 09-13-1975 (39 y.o. Female) Treating RN: Curtis Sites Primary Care Physician: [name] Other Clinician: Referring Physician: [name] Treating Physician/Extender: [name] in Treatment: 17 Education Assessment Education Provided To: Patient Education Topics Provided Wound/Skin Impairment: Handouts: Other: wound care as ordered Methods: Demonstration, Explain/Verbal Responses: State content correctly Electronic Signature(s) Signed: 03/29/2015 5:11:41 PM By: [name] MD Entered By: [name] on 03/29/2015 15:46:18 [name], [name] ([id number]) -------------------------------------------------------------------------------- Wound Assessment Details Patient Name: Wattenbarger,  Jlyn M. Date of Service: [name] 3:00 PM Medical Record Number: [id number] Patient Account Number: [id number] Date of Birth/Sex: 08/24/1975 (39 y.o. Female) Treating RN: Curtis Sites Primary Care Physician: [name] Other Clinician: Referring Physician: [name] Treating Physician/Extender: [name] in Treatment: 17 Wound Status Wound Number: 1 Primary Diabetic Wound/Ulcer of the Lower Etiology: Extremity Wound Location: Left Foot - Plantar, Lateral Wound Status: Open Wounding Event: Gradually Appeared Comorbid Type II Diabetes Date Acquired: 11/21/2013 History: Weeks Of Treatment: 17 Clustered Wound: No Photos Photo Uploaded By: Curtis Sites on 03/29/2015 15:32:11 Wound Measurements Length: (cm) 1 Width: (cm) 0.6 Depth: (cm) 1.9 Area: (cm) 0.471 Volume: (cm) 0.895 % Reduction in Area: 79% % Reduction in Volume: 55.7% Epithelialization: Small (1-33%) Tunneling: No Undermining: No Wound Description Classification: Grade 3 Wagner Verification: Abscess Wound Margin: Thickened Exudate Amount: Large Exudate Type: Serosanguineous Exudate Color: red, brown Foul Odor After Cleansing: Yes Due to Product Use: No Wound Bed Granulation Amount: Medium (34-66%) Exposed Structure Granulation Quality: Pink Fascia Exposed: No Necrotic Amount: Small (1-33%) Fat Layer Exposed: No Brandy, Rakiyah M. ([id number]) Necrotic Quality: Adherent Slough Tendon Exposed: No Muscle Exposed: No Joint Exposed: No Bone Exposed: Yes Periwound Skin Texture Texture Color No Abnormalities Noted: No No Abnormalities Noted: No Callus: Yes Atrophie Blanche: No Crepitus: No Cyanosis: No Excoriation: No Ecchymosis: No Fluctuance: No Erythema: No Friable: No Hemosiderin Staining: No Induration: No Mottled: No Localized Edema: Yes Pallor: No Rash: No Rubor: No Scarring: No Temperature / Pain Moisture Temperature: No Abnormality No Abnormalities Noted:  No Dry / Scaly: No Maceration: Yes Moist: Yes Wound Preparation Ulcer Cleansing: Rinsed/Irrigated with Saline Topical Anesthetic Applied: Other: lidocaine 4%, Treatment Notes Wound #1 (Left, Lateral, Plantar Foot) 1. Cleansed with: Clean wound with Normal Saline 2. Anesthetic Topical Lidocaine 4% cream to wound bed prior to debridement 4. Dressing Applied: Iodoform packing Gauze 5. Secondary Dressing Applied Gauze and Kerlix/Conform 7. Secured with Tape Notes Optician, dispensing) Signed: 03/29/2015 4:56:28 PM By: Curtis Sites Entered  By: Curtis Sites on 03/29/2015 14:56:59 Crumpacker, [name] ([id number]) Burditt, [name] -------------------------------------------------------------------------------- Vitals Details Patient Name: Bowden, Anwen M. Date of Service: [name] 3:00 PM Medical Record Number: [id number] Patient Account Number: [id number] Date of Birth/Sex: [birth year] (39 y.o. Female) Treating RN: Curtis Sites Primary Care Physician: [name] Other Clinician: Referring Physician: [name] Treating Physician/Extender: [name] in Treatment: 17 Vital Signs Time Taken: 14:51 Temperature (F): 98.2 Height (in): 67 Pulse (bpm): 95 Weight (lbs): 290 Respiratory Rate (breaths/min): 16 Body Mass Index (BMI): 45.4 Blood Pressure (mmHg): 145/84 Reference Range: 80 - 120 mg / dl Electronic Signature(s) Signed: 03/29/2015 4:56:28 PM By: Curtis Sites Entered By: Curtis Sites on 03/29/2015 14:52:30

## 2015-04-04 ENCOUNTER — Encounter: Payer: Medicaid Other | Admitting: Surgery

## 2015-04-05 ENCOUNTER — Encounter: Payer: Medicaid Other | Admitting: Surgery

## 2015-04-05 DIAGNOSIS — L97523 Non-pressure chronic ulcer of other part of left foot with necrosis of muscle: Secondary | ICD-10-CM | POA: Diagnosis not present

## 2015-04-05 LAB — GLUCOSE, CAPILLARY
Glucose-Capillary: 191 mg/dL — ABNORMAL HIGH (ref 65–99)
Glucose-Capillary: 112 mg/dL — ABNORMAL HIGH (ref 65–99)

## 2015-04-05 NOTE — Progress Notes (Signed)
[name], Elysse M. ([id number]) Visit Report for 04/05/2015 Arrival Information Details Patient Name: [name], [name] 04/05/2015 1:00 Date of Service: PM Medical Record [id number] Number: Patient Account Number: [id number] 07-02-1975 (39 y.o. Treating RN: Date of Birth/Sex: Female) Other Clinician: Izetta DakinWallace, Sallie Primary Care Physician: Sherrie MustacheJADALI, FAYEGH Treating Britto, Errol Referring Physician: Sherrie MustacheJADALI, FAYEGH Physician/Extender: Tania AdeWeeks in Treatment: 18 Visit Information History Since Last Visit Added or deleted any medications: No Patient Arrived: Ambulatory Any new allergies or adverse reactions: No Arrival Time: 12:40 Had a fall or experienced change in No Accompanied By: self activities of daily living that may affect Transfer Assistance: None risk of falls: Patient Identification Verified: Yes Signs or symptoms of abuse/neglect No Secondary Verification Process Yes since last visito Completed: Hospitalized since last visit: No Patient Has Alerts: Yes Has Dressing in Place as Prescribed: Yes Patient Alerts: Type II Diabetic Has Footwear/Offloading in Place as Yes 11/30/2014 ABI: Prescribed: (L) 1.24 (R) Right: Surgical Shoe with 0.92 Pressure Relief Insole Pain Present Now: No Electronic Signature(s) Signed: 04/05/2015 3:16:23 PM By: Dayton MartesWallace, RCP,RRT,CHT, Sallie RCP, RRT, CHT Entered By: Weyman RodneyWallace, RCP,RRT,CHT, [name] on 04/05/2015 12:45:22 [name], [name] ([id number]) -------------------------------------------------------------------------------- Encounter Discharge Information Details Patient Name: [name], [name] 04/05/2015 1:00 Date of Service: PM Medical Record [id number] Number: Patient Account Number: [id number] 07-02-1975 (39 y.o. Treating RN: Date of Birth/Sex: Female) Other Clinician: Izetta DakinWallace, Sallie Primary Care Physician: Sherrie MustacheJADALI, FAYEGH Treating Evlyn KannerBritto, Errol Referring Physician: Sherrie MustacheJADALI, FAYEGH Physician/Extender: Tania AdeWeeks in  Treatment: 18 Encounter Discharge Information Items Discharge Pain Level: 0 Discharge Condition: Stable Ambulatory Status: Ambulatory Discharge Destination: Home Private Transportation: Auto Accompanied By: self Schedule Follow-up Appointment: No Medication Reconciliation completed and No provided to Patient/Care Alessandria Henken: Clinical Summary of Care: Notes Patient has an HBO treatment scheduled on 04/06/15 at 13:00 pm. Electronic Signature(s) Signed: 04/05/2015 3:16:23 PM By: Dayton MartesWallace, RCP,RRT,CHT, Sallie RCP, RRT, CHT Entered By: Dayton MartesWallace, RCP,RRT,CHT, Sallie on 04/05/2015 14:51:11 [place], [name] ([id number]) -------------------------------------------------------------------------------- Vitals Details Patient Name: [name], [name] 04/05/2015 1:00 Date of Service: PM Medical Record [id number] Number: Patient Account Number: [id number] 07-02-1975 (39 y.o. Treating RN: Date of Birth/Sex: Female) Other Clinician: Izetta DakinWallace, Sallie Primary Care Physician: Sherrie MustacheJADALI, FAYEGH Treating Britto, Errol Referring Physician: Sherrie MustacheJADALI, FAYEGH Physician/Extender: Tania AdeWeeks in Treatment: 18 Vital Signs Time Taken: 12:34 Temperature (F): 97.7 Height (in): 67 Pulse (bpm): 78 Weight (lbs): 290 Respiratory Rate (breaths/min): 18 Body Mass Index (BMI): 45.4 Blood Pressure (mmHg): 144/94 Capillary Blood Glucose (mg/dl): 188191 Reference Range: 80 - 120 mg / dl Electronic Signature(s) Signed: 04/05/2015 3:16:23 PM By: Dayton MartesWallace, RCP,RRT,CHT, Sallie RCP, RRT, CHT Entered By: Weyman RodneyWallace, RCP,RRT,CHT, [name] on 04/05/2015 12:46:07

## 2015-04-05 NOTE — Progress Notes (Addendum)
CERENITI, CURB ([id number]) Visit Report for 04/05/2015 Chief Complaint Document Details Patient Name: [name], [name] 04/05/2015 3:15 Date of Service: PM Medical Record [id number] Number: Patient Account Number: [id number] [birth year] (39 y.o. Treating RN: Date of Birth/Sex: Female) Other Clinician: Primary Care Physician: Casilda Carls Treating Nathan Moctezuma Referring Physician: Casilda Carls Physician/Extender: Weeks in Treatment: 18 Information Obtained from: Patient Chief Complaint Patients presents for treatment of an open diabetic ulcer. The patient has had an ulcerated wound on her left lower extremity for about 2 years. Electronic Signature(s) Signed: 04/05/2015 3:37:42 PM By: Christin Fudge MD, FACS Entered By: Christin Fudge on 04/05/2015 15:37:42 Campanile, Vilinda Blanks ([id number]) -------------------------------------------------------------------------------- Debridement Details Patient Name: [name] 04/05/2015 3:15 Date of Service: PM Medical Record [id number] Number: Patient Account Number: [id number] Oct 30, 1975 (39 y.o. Treating RN: Date of Birth/Sex: Female) Other Clinician: Primary Care Physician: Casilda Carls Treating Effrey Davidow Referring Physician: Casilda Carls Physician/Extender: Suella Grove in Treatment: 18 Debridement Performed for Wound #1 Left,Lateral,Plantar Foot Assessment: Performed By: Physician Christin Fudge, MD Debridement: Debridement Pre-procedure Yes Verification/Time Out Taken: Start Time: 15:28 Pain Control: Lidocaine 4% Topical Solution Level: Skin/Subcutaneous Tissue Total Area Debrided (L x 0.7 (cm) x 0.2 (cm) = 0.14 (cm) W): Tissue and other Viable, Non-Viable, Fibrin/Slough, Skin, Subcutaneous material debrided: Instrument: Forceps, Scissors Bleeding: Minimum Hemostasis Achieved: Pressure End Time: 15:31 Procedural Pain: 0 Post Procedural Pain: 0 Response to Treatment: Procedure was tolerated  well Post Debridement Measurements of Total Wound Length: (cm) 1 Width: (cm) 0.6 Depth: (cm) 1.4 Volume: (cm) 0.66 Post Procedure Diagnosis Same as Pre-procedure Electronic Signature(s) Signed: 04/05/2015 3:37:37 PM By: Christin Fudge MD, FACS Entered By: Christin Fudge on 04/05/2015 15:37:36 Karrer, Vilinda Blanks ([id number]) -------------------------------------------------------------------------------- HPI Details Patient Name: [name] 04/05/2015 3:15 Date of Service: PM Medical Record [id number] Number: Patient Account Number: [id number] 1975-08-25 (39 y.o. Treating RN: Date of Birth/Sex: Female) Other Clinician: Primary Care Physician: Casilda Carls Treating Shacoria Latif Referring Physician: Casilda Carls Physician/Extender: Weeks in Treatment: 18 History of Present Illness Location: left foot ulcer Quality: Patient reports No Pain. Severity: Wound is moderately severed with some exposed structure in the wound bed. Duration: Patient has had the wound for > 2 years prior to seeking treatment at the wound center Timing: for the last 2 years she has been treated at the [facility] wound care center Context: The wound appeared gradually over time Modifying Factors: Other treatment(s) tried include:in the last month she has been operated on the left foot for an abscess of that foot and was in hospital for 2 weeks. Associated Signs and Symptoms: Periwound excoriated and wound draining heavily HPI Description: 39 year old patient who is known to be a diabetic comes from the office of Dr. Rosario Jacks for a ulcer on the left lower extremity and foot. She has had an ulcer on the left foot and has had previous treatment at [facility]. the patient tells [place] that she has been seen as the outpatient center for a long while and has had various tests including MRIs done but she is not a very good historian. Most recently about a month ago she had a abscess drained from her left foot and was  in hospital for about 2 weeks and was then discharged with a PICC line and IV antibioticstics which she knows may be vancomycin and Levaquin. She has never had a wound VAC now has she had hyperbaric oxygen therapy. Past medical history significant for dyspnea, hypertension, hyperlipidemia, neuropathy, diabetes type 2 uncontrolled. She smokes cigarettes the latest  blood reports are from March 2016 and show that her BMP is essentially normal her lipid panel was also normal her hemoglobin A1c was 9.4. 12/07/2014 -- Her tissue culture is back and she has grown significant amounts of Proteus mirabilis, pseudomonas aeruginosa and Staphylococcus aureus. The staph is a MSSA and all the cultures are sensitive to ciprofloxacin. I will start her on Cipro 500 mg by mouth twice a day for 14 days. Xray left foot -- IMPRESSION:No evidence of osteomyelitis. Healed fracture of the distal left third metatarsal. 12/21/2014 -- she is still awaiting clearance from her insurance company for HBOT. She was seen by Dr. Rosario Jacks was working on controlling her diabetes and she continues to work on giving up smoking. She has completed her dosage of ciprofloxacin and we will be continuing with local care. 01/03/2015 -- her insurance clearance is still not in and we are awaiting this before starting her on a hyperbaric oxygen therapy. 01/09/2015 - we are still awaiting her insurance clearance and other than that she is doing fine. 01/26/2015 -- she was unable to tolerate the total contact cast and hence had a Darco shoe back on her left [place], [place] M. ([id number]) foot. Her blood sugars have been doing better and her last hemoglobin A1c report is pending. He continues to smoke 6-8 cigarettes a day. 02/02/2015 -- yesterday her left leg started getting swollen and has red streaks starting at the foot and going up to her knee. She has had a mild fever and has not gone to see her PCP or [facility] where she normally  gets her treatment for this. 02/15/2015 - she has recently been discharged from [facility] after she was admitted for a cellulitis abscess and osteomyelitis of her left foot. We haven't received all her reports yet but as per the patient she was on IV antibiotics for 10 days and then discharged home on Augmentin. Addendum -- was able to look at her records in [place]. Hospital Course: LANEISHA MINO is a 39 y.o. female with PMH of HTN, DM, depression, and tobacco abuse who presented with left diabetic foot ulcer and cellulitis. Left leg cellulitis/Left diabetic foot ulcer with osteomyelitis: The patient presented with worsening pain and green pus drainage from her diabetic foot ulcer and surrounding cellulitis. Most recently, she was hospitalized 4/12-4/19 for this foot wound. She was treated with Vancomycin and Zosyn. ID was consulted and recommended that the patient receive Daptomycin, Ceftazidime, and Flagyl x 2 weeks. She had a PICC line placed prior to discharge. After discharge, she was transitioned to oral antibiotics. Wound cultures have grown GAS, GBS, MSSA, and MRSA.Marland Kitchen She notes that she was last treated with Ciprofloxacin for about 2 weeks and completed this course at least 2 weeks ago. She continues to follow with her wound clinic in [place] weekly. - Obtained MRI LLE which showed osteomyelitis of the left fourth metatarsal head. IMPRESSION: Findings compatible with acute osteomyelitis of the left fourth metatarsal head, with question of nondisplaced pathologic fracture. Plantar ulcer underlying the fourth MTP joint and diffuse soft tissue edema are noted. - Consulted Vascular Surgery, appreciate assistance. Performed left foot ulcer/bone debridement 8/15, sent bone for culture which showed no growth to date. - Started IV Vancomycin, Ceftaz, and Flagyl on admission. Transitioned to IV Unasyn 8/15. Given negative bone cultures, will transition to oral Augmentin for 2 week  course at discharge. - Provided wound care w/ wet to dry packing to left foot wound with Kerlix wrap, change  daily. - Per Vascular Surgery, WBAT LLE with offloading shoe. - Provided pain control with Oxycodone PRN, although pain had improved considerably prior to discharge. - Plan for follow up with Wound Clinic in [place] within 1 week after discharge. Arterial Duplex also done -- Final Interpretationo-- Left: Normal examination. No evidence of arterial occlusive disease.o LT Great toe pressure = 82 mmHg. No significant change asoocompared to previous study.o 02/23/2015 -- he has not Been able to see Dr. Ola Spurr and is running out of her Augmentin so we shall extend this. She had a stomach bug yesterday and could not make it to HBO. Continues to smoke but is working on quitting. 03/02/2015 -- she was seen by Dr. Ola Spurr yesterday and he has put on doxycycline and ciprofloxacin for 3 weeks. She continues with hyperbaric oxygen therapy and has been doing fine. 03/23/2015-- note from Dr. Ola Spurr reviewed dated 03/21/2015 -- he has put her on ciprofloxacin 1 tablet twice a day and doxycycline 1 tablet twice a day for 4 weeks. He has taken fresh cultures and if her ESR and CRP are increasing or she grows resistant organism he will plan to place a PICC line and start on IV Kulish, Anika M. ([id number]) antibiotics. He also recommends that if the infection does not heal she may need surgical resection of the metatarsal head and would consider this in about 4 weekso time. She continues to smoke however and I have again urged her to give this up completely. Her blood sugars under control. 04/05/2015 -- She has recently been reviewed by Dr. Ola Spurr and the culture of the foot showed MDR Escherichia coli and Grp Stret. This is resistant to Cipro and he has recommended a PICC line placement and starting on Ceftriaxone 2 g IV daily for 6 weeks. He will check labs on a weekly basis. The  patient was also having a lot of problems with her years and had IUD checkup and after giving a consult with therapy she was able to tolerate hyperbaric oxygen again today. Electronic Signature(s) Signed: 04/05/2015 3:38:31 PM By: Christin Fudge MD, FACS Entered By: Christin Fudge on 04/05/2015 15:38:31 Mincey, Vilinda Blanks ([id number]) -------------------------------------------------------------------------------- Physical Exam Details Patient Name: LORRA, FREEMAN. 04/05/2015 3:15 Date of Service: PM Medical Record [id number] Number: Patient Account Number: [id number] 09-06-1975 (39 y.o. Treating RN: Date of Birth/Sex: Female) Other Clinician: Primary Care Physician: Casilda Carls Treating Shawna Kiener Referring Physician: Casilda Carls Physician/Extender: Weeks in Treatment: 18 Constitutional . Pulse regular. Respirations normal and unlabored. Afebrile. . Eyes Nonicteric. Reactive to light. Ears, Nose, Mouth, and Throat Lips, teeth, and gums WNL.Marland Kitchen Moist mucosa without lesions . Neck supple and nontender. No palpable supraclavicular or cervical adenopathy. Normal sized without goiter. Respiratory WNL. No retractions.. Cardiovascular Pedal Pulses WNL. No clubbing, cyanosis or edema. Lymphatic No adneopathy. No adenopathy. No adenopathy. Musculoskeletal Adexa without tenderness or enlargement.. Digits and nails w/o clubbing, cyanosis, infection, petechiae, ischemia, or inflammatory conditions.. Integumentary (Hair, Skin) No suspicious lesions. No crepitus or fluctuance. No peri-wound warmth or erythema. No masses.Marland Kitchen Psychiatric Judgement and insight Intact.. No evidence of depression, anxiety, or agitation.. Notes the wound has depth to it and the opening on the plantar aspect is got some necrotic debris which were sharply debrided with forceps and scissors. The opening has been widens with a dressing will be easier. There is no pus or evidence of cellulitis on the  plantar or dorsum of the foot. Electronic Signature(s) Signed: 04/05/2015 3:39:26 PM By: Christin Fudge MD, FACS Entered By: Con Memos  Dorwin Fitzhenry on 04/05/2015 15:39:25 Even, Vilinda Blanks ([id number]) -------------------------------------------------------------------------------- Physician Orders Details Patient Name: ITZEL, MCKIBBIN. 04/05/2015 3:15 Date of Service: PM Medical Record [id number] Number: Patient Account Number: [id number] [birth year] (39 y.o. Treating RN: Montey Hora Date of Birth/Sex: Female) Other Clinician: Primary Care Physician: Casilda Carls Treating [place], [place] Referring Physician: Casilda Carls Physician/Extender: Suella Grove in Treatment: 32 Verbal / Phone Orders: Yes Clinician: Montey Hora Read Back and Verified: Yes Diagnosis Coding ICD-10 Coding Code Description E11.621 Type 2 diabetes mellitus with foot ulcer E08.40 Diabetes mellitus due to underlying condition with diabetic neuropathy, unspecified L97.513 Non-pressure chronic ulcer of other part of right foot with necrosis of muscle F17.218 Nicotine dependence, cigarettes, with other nicotine-induced disorders E66.01 Morbid (severe) obesity due to excess calories L03.116 Cellulitis of left lower limb 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 Anesthetic Wound #1 Left,Lateral,Plantar Foot o Topical Lidocaine 4% cream applied to wound bed prior to debridement Primary Wound Dressing Wound #1 Left,Lateral,Plantar Foot o Iodoform packing Gauze Secondary Dressing Wound #1 Left,Lateral,Plantar Foot o Gauze and Kerlix/Conform Dressing Change Frequency Wound #1 Left,Lateral,Plantar Foot o Change dressing every other day. Follow-up Appointments Wound #1 Left,Lateral,Plantar Foot Neyens, Vilinda Blanks. ([id number]) o Return Appointment in 1 week. Off-Loading Wound #1  Left,Lateral,Plantar Foot o Open toe surgical shoe to: Additional Orders / Instructions Wound #1 Left,Lateral,Plantar Foot o Stop Smoking o Increase protein intake. Hyperbaric Oxygen Therapy Wound #1 Left,Lateral,Plantar Foot o Evaluate for HBO Therapy o Indication: - Osteomyelitis; Abscess o 2.0 ATA for 90 Minutes without Air Breaks o One treatment per day (delivered Monday through Friday unless otherwise specified in Special Instructions below): o Total # of Treatments: - 30-40 o Follow Hyperbaric Oxygen Glycemia Protocol Medications-please add to medication list. Wound #1 Left,Lateral,Plantar Foot o P.O. Antibiotics - Doxycycline and Cipro prescribed by Dr. Ola Spurr o Other: - to start IV abx asap - PICC line in place right arm GLYCEMIA INTERVENTIONS PROTOCOL PRE-HBO [place] pre-HBO capillary blood 1 glucose (ensure physician order is in chart). A. Notify HBO physician and await physician orders. 2 If result is 70 mg/dl or below: B. If the result meets the hospital definition of a critical result, follow hospital policy. If result is 71 mg/dl to 130 mg/dl: A. Give patient an 8 ounce Glucerna Shake, an 8 ounce Ensure, or 8 ounces of a Glucerna/Ensure equivalent dietary supplement*. B. Wait 30 minutes. C. Retest patientos capillary blood glucose (CBG). D. If result greater than or equal to 110 mg/dl, proceed with HBO. Lysne, Vilinda Blanks ([id number]) If result less than 110 mg/dl, notify HBO physician and consider holding HBO. If result is 131 mg/dl to 249 mg/dl: A. Proceed with HBO. A. Notify HBO physician and await physician orders. B. It is recommended to hold HBO and do blood/urine ketone If result is 250 mg/dl or greater: testing. C. If the result meets the hospital definition of a critical result, follow hospital policy. POST-HBO GLYCEMIA INTERVENTIONS ACTION INTERVENTION Obtain post  HBO capillary blood 1 glucose (ensure physician order is in chart). A. Notify HBO physician and await physician orders. 2 If result is 70 mg/dl or below: B. If the result meets the hospital definition of a critical result, follow hospital policy. A. Give patient an 8 ounce Glucerna Shake, an 8 ounce Ensure, or 8 ounces of a Glucerna/Ensure equivalent dietary supplement*. B. Wait 15 minutes for symptoms of hypoglycemia (i.e. nervousness, anxiety, If result is 71  mg/dl to 100 mg/dl: sweating, chills, clamminess, irritability, confusion, tachycardia or dizziness). C. If patient asymptomatic, discharge patient. If patient symptomatic, repeat capillary blood glucose (CBG) and notify HBO physician. If result is 101 mg/dl to 249 mg/dl: A. Discharge patient. A. Notify HBO physician and await physician orders. B. It is recommended to do If result is 250 mg/dl or greater: blood/urine ketone testing. C. If the result meets the hospital definition of a critical result, follow hospital policy. *Juice or candies are NOT equivalent products. If patient refuses the Glucerna or Ensure, please consult the hospital dietitian for an appropriate substitute. DANI, WALLNER ([id number]) Electronic Signature(s) Signed: 04/05/2015 4:06:38 PM By: Christin Fudge MD, FACS Signed: 04/05/2015 4:23:35 PM By: Montey Hora Entered By: Montey Hora on 04/05/2015 15:41:56 Siple, Vilinda Blanks ([id number]) -------------------------------------------------------------------------------- Problem List Details Patient Name: TYSHAY, ADEE. 04/05/2015 3:15 Date of Service: PM Medical Record [id number] Number: Patient Account Number: [id number] 08-10-1975 (39 y.o. Treating RN: Date of Birth/Sex: Female) Other Clinician: Primary Care Physician: Casilda Carls Treating Ryelan Kazee, [place] Referring Physician: Casilda Carls Physician/Extender: Weeks in Treatment: 18 Active  Problems ICD-10 Encounter Code Description Active Date Diagnosis E11.621 Type 2 diabetes mellitus with foot ulcer 11/30/2014 Yes E08.40 Diabetes mellitus due to underlying condition with diabetic 11/30/2014 Yes neuropathy, unspecified L97.513 Non-pressure chronic ulcer of other part of right foot with 11/30/2014 Yes necrosis of muscle F17.218 Nicotine dependence, cigarettes, with other nicotine- 11/30/2014 Yes induced disorders E66.01 Morbid (severe) obesity due to excess calories 11/30/2014 Yes L03.116 Cellulitis of left lower limb 02/02/2015 Yes 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 ([id number]) -------------------------------------------------------------------------------- Progress Note Details Patient Name: JAYCELYN, ORRISON. 04/05/2015 3:15 Date of Service: PM Medical Record [id number] Number: Patient Account Number: [id number] 01-Dec-1975 (39 y.o. Treating RN: Date of Birth/Sex: Female) Other Clinician: Primary Care Physician: Casilda Carls Treating Marcellus Pulliam Referring Physician: Casilda Carls Physician/Extender: Suella Grove in Treatment: 18 Subjective Chief Complaint Information obtained from Patient Patients presents for treatment of an open diabetic ulcer. The patient has had an ulcerated wound on her left lower extremity for about 2 years. History of Present Illness (HPI) The following HPI elements were documented for the patient's wound: Location: left foot ulcer Quality: Patient reports No Pain. Severity: Wound is moderately severed with some exposed structure in the wound bed. Duration: Patient has had the wound for > 2 years prior to seeking treatment at the wound center Timing: for the last 2 years she has been  treated at the [facility] wound care center Context: The wound appeared gradually over time Modifying Factors: Other treatment(s) tried include:in the last month she has been operated on the left foot for an abscess of that foot and was in hospital for 2 weeks. Associated Signs and Symptoms: Periwound excoriated and wound draining heavily 39 year old patient who is known to be a diabetic comes from the office of Dr. Rosario Jacks for a ulcer on the left lower extremity and foot. She has had an ulcer on the left foot and has had previous treatment at [facility]. the patient tells [place] that she has been seen as the outpatient center for a long while and has had various tests including MRIs done but she is not a very good historian. Most recently about a month ago she had a abscess drained from her left foot and was in  hospital for about 2 weeks and was then discharged with a PICC line and IV antibioticstics which she knows may be vancomycin and Levaquin. She has never had a wound VAC now has she had hyperbaric oxygen therapy. Past medical history significant for dyspnea, hypertension, hyperlipidemia, neuropathy, diabetes type 2 uncontrolled. She smokes cigarettes the latest blood reports are from March 2016 and show that her BMP is essentially normal her lipid panel was also normal her hemoglobin A1c was 9.4. 12/07/2014 -- Her tissue culture is back and she has grown significant amounts of Proteus mirabilis, pseudomonas aeruginosa and Staphylococcus aureus. The staph is a MSSA and all the cultures are sensitive to ciprofloxacin. I will start her on Cipro 500 mg by mouth twice a day for 14 days. Xray left foot -- IMPRESSION:No evidence of osteomyelitis. Healed fracture of the distal left Akamine, Charmeka M. ([id number]) third metatarsal. 12/21/2014 -- she is still awaiting clearance from her insurance company for HBOT. She was seen by Dr. Rosario Jacks was working on controlling her diabetes and she continues to work on  giving up smoking. She has completed her dosage of ciprofloxacin and we will be continuing with local care. 01/03/2015 -- her insurance clearance is still not in and we are awaiting this before starting her on a hyperbaric oxygen therapy. 01/09/2015 - we are still awaiting her insurance clearance and other than that she is doing fine. 01/26/2015 -- she was unable to tolerate the total contact cast and hence had a Darco shoe back on her left foot. Her blood sugars have been doing better and her last hemoglobin A1c report is pending. He continues to smoke 6-8 cigarettes a day. 02/02/2015 -- yesterday her left leg started getting swollen and has red streaks starting at the foot and going up to her knee. She has had a mild fever and has not gone to see her PCP or [facility] where she normally gets her treatment for this. 02/15/2015 - she has recently been discharged from [facility] after she was admitted for a cellulitis abscess and osteomyelitis of her left foot. We haven't received all her reports yet but as per the patient she was on IV antibiotics for 10 days and then discharged home on Augmentin. Addendum -- was able to look at her records in [place]. Hospital Course: NAKISHA CHAI is a 39 y.o. female with PMH of HTN, DM, depression, and tobacco abuse who presented with left diabetic foot ulcer and cellulitis. Left leg cellulitis/Left diabetic foot ulcer with osteomyelitis: The patient presented with worsening pain and green pus drainage from her diabetic foot ulcer and surrounding cellulitis. Most recently, she was hospitalized 4/12-4/19 for this foot wound. She was treated with Vancomycin and Zosyn. ID was consulted and recommended that the patient receive Daptomycin, Ceftazidime, and Flagyl x 2 weeks. She had a PICC line placed prior to discharge. After discharge, she was transitioned to oral antibiotics. Wound cultures have grown GAS, GBS, MSSA, and MRSA.Marland Kitchen She notes that she  was last treated with Ciprofloxacin for about 2 weeks and completed this course at least 2 weeks ago. She continues to follow with her wound clinic in [place] weekly. - Obtained MRI LLE which showed osteomyelitis of the left fourth metatarsal head. IMPRESSION: Findings compatible with acute osteomyelitis of the left fourth metatarsal head, with question of nondisplaced pathologic fracture. Plantar ulcer underlying the fourth MTP joint and diffuse soft tissue edema are noted. - Consulted Vascular Surgery, appreciate assistance. Performed left foot ulcer/bone debridement 8/15,  sent bone for culture which showed no growth to date. - Started IV Vancomycin, Ceftaz, and Flagyl on admission. Transitioned to IV Unasyn 8/15. Given negative bone cultures, will transition to oral Augmentin for 2 week course at discharge. - Provided wound care w/ wet to dry packing to left foot wound with Kerlix wrap, change daily. - Per Vascular Surgery, WBAT LLE with offloading shoe. - Provided pain control with Oxycodone PRN, although pain had improved considerably prior to discharge. - Plan for follow up with [facility] in [place] within 1 week after discharge. Arterial Duplex also done -- Final Interpretation -- Left: Normal examination. No evidence of arterial occlusive disease. LT Great toe pressure = 82 mmHg. No significant change as compared to previous study. GALE, KLAR ([id number]) 02/23/2015 -- he has not Been able to see Dr. Ola Spurr and is running out of her Augmentin so we shall extend this. She had a stomach bug yesterday and could not make it to HBO. Continues to smoke but is working on quitting. 03/02/2015 -- she was seen by Dr. Ola Spurr yesterday and he has put on doxycycline and ciprofloxacin for 3 weeks. She continues with hyperbaric oxygen therapy and has been doing fine. 03/23/2015-- note from Dr. Ola Spurr reviewed dated 03/21/2015 -- he has put her on ciprofloxacin 1  tablet twice a day and doxycycline 1 tablet twice a day for 4 weeks. He has taken fresh cultures and if her ESR and CRP are increasing or she grows resistant organism he will plan to place a PICC line and start on IV antibiotics. He also recommends that if the infection does not heal she may need surgical resection of the metatarsal head and would consider this in about 4 weeks time. She continues to smoke however and I have again urged her to give this up completely. Her blood sugars under control. 04/05/2015 -- She has recently been reviewed by Dr. Ola Spurr and the culture of the foot showed MDR Escherichia coli and Grp Stret. This is resistant to Cipro and he has recommended a PICC line placement and starting on Ceftriaxone 2 g IV daily for 6 weeks. He will check labs on a weekly basis. The patient was also having a lot of problems with her years and had IUD checkup and after giving a consult with therapy she was able to tolerate hyperbaric oxygen again today. Objective Constitutional Pulse regular. Respirations normal and unlabored. Afebrile. Vitals Time Taken: 2:40 PM, Height: 67 in, Weight: 290 lbs, BMI: 45.4, Temperature: 97.7 F, Pulse: 78 bpm, Respiratory Rate: 18 breaths/min, Blood Pressure: 144/92 mmHg, Capillary Blood Glucose: 112 mg/dl. Eyes Nonicteric. Reactive to light. Ears, Nose, Mouth, and Throat Lips, teeth, and gums WNL.Marland Kitchen Moist mucosa without lesions . Neck supple and nontender. No palpable supraclavicular or cervical adenopathy. Normal sized without goiter. Respiratory WNL. No retractions.. Cardiovascular Pedal Pulses WNL. No clubbing, cyanosis or edema. Lymphatic Trentham, Destinee M. ([id number]) No adneopathy. No adenopathy. No adenopathy. Musculoskeletal Adexa without tenderness or enlargement.. Digits and nails w/o clubbing, cyanosis, infection, petechiae, ischemia, or inflammatory conditions.Marland Kitchen Psychiatric Judgement and insight Intact.. No evidence of  depression, anxiety, or agitation.. General Notes: the wound has depth to it and the opening on the plantar aspect is got some necrotic debris which were sharply debrided with forceps and scissors. The opening has been widens with a dressing will be easier. There is no pus or evidence of cellulitis on the plantar or dorsum of the foot. Integumentary (Hair, Skin) No suspicious lesions. No crepitus or fluctuance.  No peri-wound warmth or erythema. No masses.. Wound #1 status is Open. Original cause of wound was Gradually Appeared. The wound is located on the [place]. The wound measures 0.7cm length x 0.2cm width x 1.4cm depth; 0.11cm^2 area and 0.154cm^3 volume. 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 She is also receive hyperbaric oxygen therapy and she is getting IV antibiotics as per infectious disease. The debridement, IV antibiotics and hyperbaric oxygen therapy is her best option to try and save this foot. Procedures Wound #1 Lovings, Vilinda Blanks ([id number]) Wound #1 is a Diabetic Wound/Ulcer of the Lower Extremity located on the Left,Lateral,Plantar Foot . There was a Skin/Subcutaneous Tissue Debridement ([id number]) debridement with total area of 0.14 sq cm performed by Christin Fudge, MD. with the following instrument(s): Forceps and Scissors to remove Viable and Non-Viable tissue/material including Fibrin/Slough, Skin, and Subcutaneous  after achieving pain control using Lidocaine 4% Topical Solution. A time out was conducted prior to the start of the procedure. A Minimum amount of bleeding was controlled with Pressure. The procedure was tolerated well with a pain level of 0 throughout and a pain level of 0 following the procedure. Post Debridement Measurements: 1cm length x 0.6cm width x 1.4cm depth; 0.66cm^3 volume. Post procedure Diagnosis Wound #1: Same as Pre-Procedure Plan Wound Cleansing: Wound #1 Left,Lateral,Plantar Foot: Clean wound with Normal Saline. Anesthetic: Wound #1 Left,Lateral,Plantar Foot: Topical Lidocaine 4% cream applied to wound bed prior to debridement Primary Wound Dressing: Wound #1 Left,Lateral,Plantar Foot: Iodoform packing Gauze Secondary Dressing: Wound #1 Left,Lateral,Plantar Foot: Gauze and Kerlix/Conform Dressing Change Frequency: Wound #1 Left,Lateral,Plantar Foot: Change dressing every other day. Follow-up Appointments: Wound #1 Left,Lateral,Plantar Foot: Return Appointment in 1 week. Off-Loading: Wound #1 Left,Lateral,Plantar Foot: Open toe surgical shoe to: Additional Orders / Instructions: Wound #1 Left,Lateral,Plantar Foot: Stop Smoking Increase protein intake. Hyperbaric Oxygen Therapy: Wound #1 Left,Lateral,Plantar Foot: Evaluate for HBO Therapy Indication: - Osteomyelitis; Abscess 2.0 ATA for 90 Minutes without Air Breaks One treatment per day (delivered Monday through Friday unless otherwise specified in Special Instructions below): Total # of Treatments: - 30-40 Champagne, Elgie M. ([id number]) Follow Hyperbaric Oxygen Glycemia Protocol Medications-please add to medication list.: Wound #1 Left,Lateral,Plantar Foot: P.O. Antibiotics - Doxycycline and Cipro prescribed by Dr. Ola Spurr Other: - to start IV abx asap - PICC line in place right arm We will continue packing with iodoform gauze and daily dressings. She is also receive hyperbaric oxygen therapy and  she is getting IV antibiotics as per infectious disease. The debridement, IV antibiotics and hyperbaric oxygen therapy is her best option to try and save this foot. Electronic Signature(s) Signed: 04/06/2015 4:09:52 PM By: Christin Fudge MD, FACS Previous Signature: 04/05/2015 3:40:13 PM Version By: Christin Fudge MD, FACS Entered By: Christin Fudge on 04/06/2015 16:09:52 Rossa, Vilinda Blanks ([id number]) -------------------------------------------------------------------------------- SuperBill Details Patient Name: Weichert, Madelynn M. Date of Service: 04/05/2015 Medical Record Number: [id number] Patient Account Number: [id number] Date of Birth/Sex: [birth year] (39 y.o. Female) Treating RN: Primary Care Physician: Casilda Carls Other Clinician: Referring Physician: Casilda Carls Treating Physician/Extender: Frann Rider in  Treatment: 18 Diagnosis Coding ICD-10 Codes Code Description E11.621 Type 2 diabetes mellitus with foot ulcer E08.40 Diabetes mellitus due to underlying condition with diabetic neuropathy, unspecified L97.513 Non-pressure chronic ulcer of other part of right foot with necrosis of muscle F17.218 Nicotine dependence, cigarettes, with other nicotine-induced disorders E66.01 Morbid (severe) obesity due to excess calories L03.116 Cellulitis of left lower limb 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, left ankle and foot Facility Procedures CPT4: Description Modifier Quantity Code [id number] 11042 - DEB SUBQ TISSUE 20 SQ CM/< 1 ICD-10 Description Diagnosis E11.621 Type 2 diabetes mellitus with foot ulcer 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, left ankle and foot E08.40 Diabetes mellitus  due to underlying condition with diabetic neuropathy, unspecified L97.513 Non-pressure chronic ulcer of other part of right foot with necrosis of muscle Physician Procedures CPT4: Description Modifier Quantity Code [id number] 85027 - WC PHYS SUBQ TISS 20 SQ CM 1 ICD-10 Description  Diagnosis 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

## 2015-04-05 NOTE — Progress Notes (Signed)
[name], [name] ([id number]) Visit Report for 04/05/2015 HBO Details Patient Name: [name], [name] 04/05/2015 1:00 Date of Service: PM Medical Record [id number] Number: Patient Account Number: [id number] 09-24-1975 (39 y.o. Treating RN: Date of Birth/Sex: Female) Other Clinician: Izetta DakinWallace, Sallie Primary Care Physician: Sherrie MustacheJADALI, FAYEGH Treating Britto, Errol Referring Physician: Sherrie MustacheJADALI, FAYEGH Physician/Extender: Tania AdeWeeks in Treatment: 18 HBO Treatment Course Details Treatment Course Ordering Physician: Evlyn KannerBritto, Errol 1 Number: HBO Treatment Start Date: 02/19/2015 Total Treatments 30 Ordered: HBO Indication: Diabetic Ulcer(s) of the Lower Extremity HBO Treatment Details Treatment Number: 17 Patient Type: Outpatient Chamber Type: Monoplace Chamber #: [id number] Treatment Protocol: 2.0 ATA with 90 minutes oxygen, and no air breaks Treatment Details Compression Rate Down: 1.5 psi / minute De-Compression Rate Up: 1.5 psi / minute Air breaks and breathing Compress Tx Pressure Decompress Decompress periods Begins Reached Begins Ends (leave unused spaces blank) Chamber Pressure 1 ATA 2.0 ATA - - - - - - 2.0 ATA 1 ATA Clock Time (24 hr) 12:43 12:54 - - - - - - 14:24 14:34 Treatment Length: 111 (minutes) Treatment Segments: 4 Capillary Blood Glucose Pre Capillary Blood Glucose (mg/dl): Post Capillary Blood Glucose (mg/dl): Vital Signs Capillary Blood Glucose Reference Range: 80 - 120 mg / dl HBO Diabetic Blood Glucose Intervention Range: <131 mg/dl or >865>249 mg/dl Time Vitals Blood Respiratory Capillary Blood Glucose Pulse Action Type: Pulse: Temperature: Taken: Pressure: Rate: Glucose (mg/dl): Meter #: Oximetry (%) Taken: Pre 12:34 144/94 78 18 97.7 191 1 none Post 14:40 138/94 78 18 97.7 112 1 none Treatment Response [name], [name] ([id number]) Treatment Completion Status: Treatment Completed without Adverse Event Electronic Signature(s) Signed:  04/05/2015 3:16:23 PM By: Sallee ProvencalWallace, RCP,RRT,CHT, Sallie RCP, RRT, CHT Signed: 04/05/2015 3:40:39 PM By: Evlyn KannerBritto, Errol MD, FACS Entered By: Dayton MartesWallace, RCP,RRT,CHT, Sallie on 04/05/2015 14:50:27 Hitch, [name] ([id number]) -------------------------------------------------------------------------------- HBO Safety Checklist Details Patient Name: [name], [name] 04/05/2015 1:00 Date of Service: PM Medical Record [id number] Number: Patient Account Number: [id number] 09-24-1975 (39 y.o. Treating RN: Date of Birth/Sex: Female) Other Clinician: Izetta DakinWallace, Sallie Primary Care Physician: Sherrie MustacheJADALI, FAYEGH Treating Britto, Errol Referring Physician: Sherrie MustacheJADALI, FAYEGH Physician/Extender: Tania AdeWeeks in Treatment: 18 HBO Safety Checklist Items Safety Checklist Consent Form Signed Patient voided / foley secured and emptied When did you last eato 18:00 pm on 04/04/15 Last dose of injectable or oral agent 20:00 pm on 04/04/15 NA Ostomy pouch emptied and vented if applicable NA All implantable devices assessed, documented and approved Intravenous access site secured and place PICC line for antibiotics Valuables secured Linens and cotton and cotton/polyester blend (less than 51% polyester) Personal oil-based products / skin lotions / body lotions removed Wigs or hairpieces removed Smoking or tobacco materials removed Books / newspapers / magazines / loose paper removed Cologne, aftershave, perfume and deodorant removed Jewelry removed (may wrap wedding band) Make-up removed Hair care products removed Battery operated devices (external) removed Heating patches and chemical warmers removed NA Titanium eyewear removed Nail polish cured greater than 10 hours NA Casting material cured greater than 10 hours NA Hearing aids removed NA Loose dentures or partials removed NA Prosthetics have been removed Patient demonstrates correct use of air break device (if applicable) Patient concerns have been  addressed Patient grounding bracelet on and cord attached to chamber Specifics for Inpatients (complete in addition to above) Medication sheet sent with patient [name], [name] ([id number]) Intravenous medications needed or due during therapy sent with patient Drainage tubes (e.g. nasogastric tube or chest tube secured and vented) Endotracheal or Tracheotomy tube secured Cuff  deflated of air and inflated with saline Airway suctioned Electronic Signature(s) Signed: 04/05/2015 3:16:23 PM By: Dayton Martes RCP, RRT, CHT Entered By: Dayton Martes on 04/05/2015 12:47:44

## 2015-04-06 ENCOUNTER — Encounter: Payer: Medicaid Other | Admitting: Surgery

## 2015-04-06 ENCOUNTER — Ambulatory Visit: Payer: Medicaid Other | Admitting: Surgery

## 2015-04-06 DIAGNOSIS — L97523 Non-pressure chronic ulcer of other part of left foot with necrosis of muscle: Secondary | ICD-10-CM | POA: Diagnosis not present

## 2015-04-06 LAB — GLUCOSE, CAPILLARY
GLUCOSE-CAPILLARY: 147 mg/dL — AB (ref 65–99)
Glucose-Capillary: 201 mg/dL — ABNORMAL HIGH (ref 65–99)

## 2015-04-06 NOTE — Progress Notes (Addendum)
[name], [name] ([id number]) Visit Report for 04/06/2015 HBO Details Patient Name: [name], [name] 04/06/2015 1:00 Date of Service: PM Medical Record [id number] Number: Patient Account Number: [id number] [birth year] (39 y.o. Treating RN: Date of Birth/Sex: Female) Other Clinician: Izetta DakinWallace, Sallie Primary Care Physician: Sherrie MustacheJADALI, FAYEGH Treating Britto, Errol Referring Physician: Sherrie MustacheJADALI, FAYEGH Physician/Extender: Tania AdeWeeks in Treatment: 18 HBO Treatment Course Details Treatment Course Ordering Physician: Evlyn KannerBritto, Errol 1 Number: HBO Treatment Start Date: 02/19/2015 Total Treatments 30 Ordered: HBO Indication: Diabetic Ulcer(s) of the Lower Extremity HBO Treatment Details Treatment Number: 18 Patient Type: Outpatient Chamber Type: Monoplace Chamber #: [id number] Treatment Protocol: 2.0 ATA with 90 minutes oxygen, and no air breaks Treatment Details Compression Rate Down: 1.5 psi / minute De-Compression Rate Up: 1.5 psi / minute Air breaks and breathing Compress Tx Pressure Decompress Decompress periods Begins Reached Begins Ends (leave unused spaces blank) Chamber Pressure 1 ATA 2.0 ATA - - - - - - 2.0 ATA 1 ATA Clock Time (24 hr) 12:40 12:52 - - - - - - 14:22 14:32 Treatment Length: 112 (minutes) Treatment Segments: 4 Capillary Blood Glucose Pre Capillary Blood Glucose (mg/dl): Post Capillary Blood Glucose (mg/dl): Vital Signs Capillary Blood Glucose Reference Range: 80 - 120 mg / dl HBO Diabetic Blood Glucose Intervention Range: <131 mg/dl or >295>249 mg/dl Time Vitals Blood Respiratory Capillary Blood Glucose Pulse Action Type: Pulse: Temperature: Taken: Pressure: Rate: Glucose (mg/dl): Meter #: Oximetry (%) Taken: Pre 12:33 142/92 90 18 98.1 201 1 none Post 14:36 136/98 84 18 97.7 147 1 none Treatment Response [name], [name] ([id number]) Treatment Completion Status: Treatment Completed without Adverse Event HBO Attestation I certify that I  supervised this HBO treatment in accordance with Medicare guidelines. A trained Yes emergency response team is readily available per hospital policies and procedures. Continue HBOT as ordered. Yes Electronic Signature(s) Signed: 04/06/2015 4:11:29 PM By: Evlyn KannerBritto, Errol MD, FACS Previous Signature: 04/06/2015 2:47:20 PM Version By: Dayton MartesWallace, RCP,RRT,CHT, Sallie RCP, RRT, CHT Previous Signature: 04/06/2015 4:02:00 PM Version By: Evlyn KannerBritto, Errol MD, FACS Entered By: Evlyn KannerBritto, Errol on 04/06/2015 16:11:29 [name], [name] ([id number]) -------------------------------------------------------------------------------- HBO Safety Checklist Details Patient Name: [name], [name] 04/06/2015 1:00 Date of Service: PM Medical Record [id number] Number: Patient Account Number: [id number] [birth year] (39 y.o. Treating RN: Date of Birth/Sex: Female) Other Clinician: Izetta DakinWallace, Sallie Primary Care Physician: Sherrie MustacheJADALI, FAYEGH Treating Britto, Errol Referring Physician: Sherrie MustacheJADALI, FAYEGH Physician/Extender: Tania AdeWeeks in Treatment: 18 HBO Safety Checklist Items Safety Checklist Consent Form Signed Patient voided / foley secured and emptied When did you last eato 11:00 am Last dose of injectable or oral agent 08:00 am NA Ostomy pouch emptied and vented if applicable NA All implantable devices assessed, documented and approved Intravenous access site secured and place Valuables secured Linens and cotton and cotton/polyester blend (less than 51% polyester) Personal oil-based products / skin lotions / body lotions removed Wigs or hairpieces removed Smoking or tobacco materials removed Books / newspapers / magazines / loose paper removed Cologne, aftershave, perfume and deodorant removed Jewelry removed (may wrap wedding band) Make-up removed Hair care products removed Battery operated devices (external) removed Heating patches and chemical warmers removed NA Titanium eyewear removed Nail polish cured  greater than 10 hours NA Casting material cured greater than 10 hours NA Hearing aids removed NA Loose dentures or partials removed NA Prosthetics have been removed Patient demonstrates correct use of air break device (if applicable) Patient concerns have been addressed Patient grounding bracelet on and cord attached to chamber Specifics for Inpatients (complete in addition  to above) Medication sheet sent with patient [name], HEATH. ([id number]) Intravenous medications needed or due during therapy sent with patient Drainage tubes (e.g. nasogastric tube or chest tube secured and vented) Endotracheal or Tracheotomy tube secured Cuff deflated of air and inflated with saline Airway suctioned Electronic Signature(s) Signed: 04/06/2015 2:47:20 PM By: Dayton Martes RCP, RRT, CHT Entered By: Dayton Martes on 04/06/2015 12:40:58

## 2015-04-06 NOTE — Progress Notes (Signed)
BAILLEY, [name] ([id number]) Visit Report for 04/05/2015 Arrival Information Details Patient Name: [name], [name] Date of Service: 04/05/2015 3:15 PM Medical Record Number: [id number] Patient Account Number: [id number] Date of Birth/Sex: 04/02/1976 (39 y.o. Female) Treating RN: Curtis Sites Primary Care Physician: [name] Other Clinician: Referring Physician: [name] Treating Physician/Extender: [name] in Treatment: 18 Visit Information History Since Last Visit Added or deleted any medications: No Patient Arrived: Ambulatory Any new allergies or adverse reactions: No Arrival Time: 15:17 Had a fall or experienced change in No Accompanied By: self activities of daily living that may affect Transfer Assistance: None risk of falls: Patient Identification Verified: Yes Signs or symptoms of abuse/neglect since last No Secondary Verification Process Yes visito Completed: Hospitalized since last visit: No Patient Has Alerts: Yes Pain Present Now: No Patient Alerts: Type II Diabetic 11/30/2014 ABI: (L) 1.24 (R) 0.92 Electronic Signature(s) Signed: 04/05/2015 3:22:35 PM By: Curtis Sites Entered By: Curtis Sites on 04/05/2015 15:22:35 Cilento, [name] ([id number]) -------------------------------------------------------------------------------- Encounter Discharge Information Details Patient Name: [name], [name] Date of Service: 04/05/2015 3:15 PM Medical Record Number: [id number] Patient Account Number: [id number] Date of Birth/Sex: 20-Apr-1976 (39 y.o. Female) Treating RN: Curtis Sites Primary Care Physician: [name] Other Clinician: Referring Physician: [name] Treating Physician/Extender: [name] in Treatment: 75 Encounter Discharge Information Items Discharge Pain Level: 0 Discharge Condition: Stable Ambulatory Status: Ambulatory Discharge Destination:  Home Private Transportation: Auto Accompanied By: self Schedule Follow-up Appointment: Yes Medication Reconciliation completed and No provided to Patient/Care Liridona Mashaw: Clinical Summary of Care: Electronic Signature(s) Signed: 04/05/2015 4:23:35 PM By: Curtis Sites Entered By: Curtis Sites on 04/05/2015 15:42:26 Masters, [name] ([id number]) -------------------------------------------------------------------------------- Lower Extremity Assessment Details Patient Name: [name], [name] Date of Service: 04/05/2015 3:15 PM Medical Record Number: [id number] Patient Account Number: [id number] Date of Birth/Sex: 1975/09/18 (39 y.o. Female) Treating RN: Curtis Sites Primary Care Physician: [name] Other Clinician: Referring Physician: [name] Treating Physician/Extender: [name] in Treatment: 18 Vascular Assessment Pulses: Posterior Tibial Dorsalis Pedis Palpable: [Left:Yes] Extremity colors, hair growth, and conditions: Extremity Color: [Left:Normal] Hair Growth on Extremity: [Left:Yes] Temperature of Extremity: [Left:Warm] Capillary Refill: [Left:< 3 seconds] Toe Nail Assessment Left: Right: Thick: Yes Discolored: Yes Deformed: Yes Improper Length and Hygiene: Yes Electronic Signature(s) Signed: 04/05/2015 3:22:55 PM By: Curtis Sites Entered By: Curtis Sites on 04/05/2015 15:22:55 Scheunemann, [name] ([id number]) -------------------------------------------------------------------------------- Multi Wound Chart Details Patient Name: [name], [name] Date of Service: 04/05/2015 3:15 PM Medical Record Number: [id number] Patient Account Number: [id number] Date of Birth/Sex: 05/16/1976 (39 y.o. Female) Treating RN: Curtis Sites Primary Care Physician: [name] Other Clinician: Referring Physician: [name] Treating Physician/Extender: [name] in Treatment: 18 Vital Signs Height(in): 67 Capillary  Blood 112 Glucose(mg/dl): Weight(lbs): 518 Pulse(bpm): 78 Body Mass Index(BMI): 45 Blood Pressure Temperature(F): 97.7 144/92 (mmHg): Respiratory Rate 18 (breaths/min): Photos: [1:No Photos] [N/A:N/A] Wound Location: [1:Left, Lateral, Plantar Foot] [N/A:N/A] Wounding Event: [1:Gradually Appeared] [N/A:N/A] Primary Etiology: [1:Diabetic Wound/Ulcer of the Lower Extremity] [N/A:N/A] Date Acquired: [1:11/21/2013] [N/A:N/A] Weeks of Treatment: [1:18] [N/A:N/A] Wound Status: [1:Open] [N/A:N/A] Measurements L x W x D 0.7x0.2x1.4 [N/A:N/A] (cm) Area (cm) : [1:0.11] [N/A:N/A] Volume (cm) : [1:0.154] [N/A:N/A] % Reduction in Area: [1:95.10%] [N/A:N/A] % Reduction in Volume: 92.40% [N/A:N/A] Classification: [1:Grade 3] [N/A:N/A] Periwound Skin Texture: No Abnormalities Noted [N/A:N/A] Periwound Skin [1:No Abnormalities Noted] [N/A:N/A] Moisture: Periwound Skin Color: No Abnormalities Noted [N/A:N/A] Tenderness on [1:No] [N/A:N/A] Treatment Notes Electronic Signature(s) Signed: 04/05/2015 3:23:17 PM By: Curtis Sites Entered By:  Curtis Sitesorthy, Joanna on 04/05/2015 15:23:17 Proudfoot, [name] ([id number]) Matzen, [name] ([id number]) -------------------------------------------------------------------------------- Multi-Disciplinary Care Plan Details Patient Name: [name], [name] Date of Service: 04/05/2015 3:15 PM Medical Record Number: [id number] Patient Account Number: [id number] Date of Birth/Sex: 02/26/1976 (39 y.o. 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 Anxiety related to knowledge deficit of hyperbaric oxygen therapy and treatment procedures Discomfort related to temperature and  humidity changes inside hyperbaric chamber Potential for barotraumas to ears, sinuses, teeth, and lungs or cerebral gas embolism related to changes in atmospheric pressure inside hyperbaric oxygen chamber Potential for oxygen toxicity seizures related to delivery of 100% oxygen at an increased atmospheric pressure Potential for pulmonary oxygen toxicity related to delivery of 100% oxygen at an increased atmospheric pressure Goals: Barotrauma will be prevented during HBO2 Date Initiated: 02/15/2015 Goal Status: Active Patient and/or family will be able to state/discuss factors appropriate to the management of their disease process during treatment Date Initiated: 02/15/2015 Goal Status: Active Patient will tolerate the hyperbaric oxygen therapy treatment Date Initiated: 02/15/2015 Goal Status: Active Patient will tolerate the internal climate of the chamber Date Initiated: 02/15/2015 Goal Status: Active Patient/caregiver will verbalize understanding of HBO goals, rationale, procedures and potential hazards Date Initiated: 02/15/2015 Goal Status: Active Signs and symptoms of pulmonary oxygen toxicity will be recognized and promptly addressed Date Initiated: 02/15/2015 Goal Status: Active Signs and symptoms of seizure will be recognized and promptly addressed ; seizing patients will suffer no harm Date Initiated: 02/15/2015 Vantuyl, [name] ([id number]) Goal Status: Active Interventions: Administer a five (5) minute air break for patient if signs and symptoms of seizure appear and notify the hyperbaric physician Administer the correct therapeutic gas delivery based on the patients needs and limitations, per physician order Assess and provide for patientos comfort related to the hyperbaric environment and equalization of middle ear Assess for signs and symptoms related to adverse events, including but not limited to confinement anxiety, pneumothorax, oxygen toxicity and  baurotrauma Assess patient for any history of confinement anxiety Assess patient's knowledge and expectations regarding hyperbaric medicine and provide education related to the hyperbaric environment, goals of treatment and prevention of adverse events Notes: Nutrition Nursing Diagnoses: Potential for alteratiion in Nutrition/Potential for imbalanced nutrition Goals: Patient/caregiver agrees to and verbalizes understanding of need to use nutritional supplements and/or vitamins as prescribed Date Initiated: 11/30/2014 Goal Status: Active Interventions: Assess patient nutrition upon admission and as needed per policy Notes: Orientation to the Wound Care Program Nursing Diagnoses: Knowledge deficit related to the wound healing center program Goals: Patient/caregiver will verbalize understanding of the Wound Healing Center Program Date Initiated: 11/30/2014 Goal Status: Active Interventions: Provide education on orientation to the wound center Notes: Sanna, Marvelle M. ([id number]) Peripheral Neuropathy Nursing Diagnoses: Potential alteration in peripheral tissue perfusion (select prior to confirmation of diagnosis) Goals: Patient/caregiver will verbalize understanding of disease process and disease management Date Initiated: 11/30/2014 Goal Status: Active Interventions: Assess signs and symptoms of neuropathy upon admission and as needed Notes: Wound/Skin Impairment Nursing Diagnoses: Impaired tissue integrity Knowledge deficit related to smoking impact on wound healing Goals: Ulcer/skin breakdown will have a volume reduction of 30% by week 4 Date Initiated: 11/30/2014 Goal Status: Active Interventions: Assess ulceration(s) every visit Notes: Electronic Signature(s) Signed: 04/05/2015 3:23:03 PM By: Curtis Sitesorthy, Joanna Entered By: Curtis Sitesorthy, Joanna on 04/05/2015 15:23:03  Shirk, [name]  ([id number]) -------------------------------------------------------------------------------- Patient/Caregiver Education Details Patient Name: Treat, Lourene M. Date of Service: 04/05/2015 3:15 PM Medical Record Number: [id number] Patient Account Number: [id number] Date of Birth/Gender: 11-16-1975 (39 y.o. Female) Treating RN: Curtis Sites Primary Care Physician: [name] Other Clinician: Referring Physician: [name] Treating Physician/Extender: [name] in Treatment: 18 Education Assessment Education Provided To: Patient Education Topics Provided Wound/Skin Impairment: Handouts: Other: wound care as ordered Methods: Demonstration, Explain/Verbal Responses: State content correctly Electronic Signature(s) Signed: 04/05/2015 3:24:14 PM By: Curtis Sites Entered By: Curtis Sites on 04/05/2015 15:24:14 Molock, [name] ([id number]) -------------------------------------------------------------------------------- Wound Assessment Details Patient Name: Secrest, Evianna M. Date of Service: 04/05/2015 3:15 PM Medical Record Number: [id number] Patient Account Number: [id number] Date of Birth/Sex: 1976-04-06 (39 y.o. Female) Treating RN: Curtis Sites Primary Care Physician: [name] Other Clinician: Referring Physician: [name] Treating Physician/Extender: [name] in Treatment: 18 Wound Status Wound Number: 1 Primary Diabetic Wound/Ulcer of the Lower Etiology: Extremity Wound Location: Left, Lateral, Plantar Foot Wound Status: Open Wounding Event: Gradually Appeared Date Acquired: 11/21/2013 Weeks Of Treatment: 18 Clustered Wound: No Photos Photo Uploaded By: Curtis Sites on 04/05/2015 16:04:16 Wound Measurements Length: (cm) 0.7 Width: (cm) 0.2 Depth: (cm) 1.4 Area: (cm) 0.11 Volume: (cm) 0.154 % Reduction in Area: 95.1% % Reduction in Volume: 92.4% Wound Description Classification: Grade 3 Periwound Skin  Texture Texture Color No Abnormalities Noted: No No Abnormalities Noted: No Moisture No Abnormalities Noted: No Treatment Notes Wound #1 (Left, Lateral, Plantar Foot) 1. Cleansed with: Fredericks, [name] ([id number]) Clean wound with Normal Saline 2. Anesthetic Topical Lidocaine 4% cream to wound bed prior to debridement 4. Dressing Applied: Iodoform packing Gauze 5. Secondary Dressing Applied Gauze and Kerlix/Conform 7. Secured with Tape Notes Optician, dispensing) Signed: 04/05/2015 4:23:35 PM By: Curtis Sites Entered By: Curtis Sites on 04/05/2015 15:19:57 Marchiano, [name] ([id number]) -------------------------------------------------------------------------------- Vitals Details Patient Name: Gulbranson, Jalayiah M. Date of Service: 04/05/2015 3:15 PM Medical Record Number: [id number] Patient Account Number: [id number] Date of Birth/Sex: Feb 05, 1976 (39 y.o. Female) Treating RN: Primary Care Physician: [name] Other Clinician: Referring Physician: [name] Treating Physician/Extender: [name] in Treatment: 18 Vital Signs Time Taken: 14:40 Temperature (F): 97.7 Height (in): 67 Pulse (bpm): 78 Weight (lbs): 290 Respiratory Rate (breaths/min): 18 Body Mass Index (BMI): 45.4 Blood Pressure (mmHg): 144/92 Capillary Blood Glucose (mg/dl): 884 Reference Range: 80 - 120 mg / dl Electronic Signature(s) Signed: 04/05/2015 3:16:23 PM By: Dayton Martes RCP, RRT, CHT Entered By: Dayton Martes on 04/05/2015 15:12:02

## 2015-04-06 NOTE — Progress Notes (Signed)
[name], [name] ([id number]) Visit Report for 04/06/2015 Arrival Information Details Patient Name: [name], [name] 04/06/2015 1:00 Date of Service: PM Medical Record [id number] Number: Patient Account Number: [id number] 1976-02-16 (39 y.o. Treating RN: Date of Birth/Sex: Female) Other Clinician: Izetta DakinWallace, [name] Primary Care Physician: Sherrie MustacheJADALI, FAYEGH Treating Britto, Errol Referring Physician: Sherrie MustacheJADALI, FAYEGH Physician/Extender: Tania AdeWeeks in Treatment: 18 Visit Information History Since Last Visit Added or deleted any medications: No Patient Arrived: Ambulatory Any new allergies or adverse reactions: No Arrival Time: 12:30 Had a fall or experienced change in No Accompanied By: self activities of daily living that may affect Transfer Assistance: None risk of falls: Patient Identification Verified: Yes Signs or symptoms of abuse/neglect No Secondary Verification Process Yes since last visito Completed: Hospitalized since last visit: No Patient Has Alerts: Yes Has Dressing in Place as Prescribed: Yes Patient Alerts: Type II Diabetic Has Footwear/Offloading in Place as Yes 11/30/2014 ABI: Prescribed: (L) 1.24 (R) Left: Surgical Shoe with 0.92 Pressure Relief Insole Right: Surgical Shoe with Pressure Relief Insole Pain Present Now: No Electronic Signature(s) Signed: 04/06/2015 2:47:20 PM By: Dayton MartesWallace, RCP,RRT,CHT, [name] RCP, RRT, CHT Entered By: Dayton MartesWallace, RCP,RRT,CHT, [name] on 04/06/2015 12:36:54 Mosley, [name] ([id number]) -------------------------------------------------------------------------------- Encounter Discharge Information Details Patient Name: [name], [name] 04/06/2015 1:00 Date of Service: PM Medical Record [id number] Number: Patient Account Number: [id number] 1976-02-16 (39 y.o. Treating RN: Date of Birth/Sex: Female) Other Clinician: Izetta DakinWallace, [name] Primary Care Physician: Sherrie MustacheJADALI, FAYEGH Treating Evlyn KannerBritto, Errol Referring Physician:  Sherrie MustacheJADALI, FAYEGH Physician/Extender: Tania AdeWeeks in Treatment: 18 Encounter Discharge Information Items Discharge Pain Level: 0 Discharge Condition: Stable Ambulatory Status: Ambulatory Discharge Destination: Home Private Transportation: Auto Accompanied By: self Schedule Follow-up Appointment: No Medication Reconciliation completed and No provided to Patient/Care Braelynn Benning: Clinical Summary of Care: Notes Patient has an HBO treatment scheduled on 10, 17/16 at 13:00 pm. Electronic Signature(s) Signed: 04/06/2015 2:47:20 PM By: Dayton MartesWallace, RCP,RRT,CHT, [name] RCP, RRT, CHT Entered By: Dayton MartesWallace, RCP,RRT,CHT, [name] on 04/06/2015 14:46:44 Buckles, [name] ([id number]) -------------------------------------------------------------------------------- Vitals Details Patient Name: [name], [name] 04/06/2015 1:00 Date of Service: PM Medical Record [id number] Number: Patient Account Number: [id number] 1976-02-16 (39 y.o. Treating RN: Date of Birth/Sex: Female) Other Clinician: Izetta DakinWallace, [name] Primary Care Physician: Sherrie MustacheJADALI, FAYEGH Treating Britto, Errol Referring Physician: Sherrie MustacheJADALI, FAYEGH Physician/Extender: Tania AdeWeeks in Treatment: 18 Vital Signs Time Taken: 12:33 Temperature (F): 98.1 Height (in): 67 Pulse (bpm): 90 Weight (lbs): 290 Respiratory Rate (breaths/min): 18 Body Mass Index (BMI): 45.4 Blood Pressure (mmHg): 142/92 Capillary Blood Glucose (mg/dl): 536201 Reference Range: 80 - 120 mg / dl Electronic Signature(s) Signed: 04/06/2015 2:47:20 PM By: Dayton MartesWallace, RCP,RRT,CHT, [name] RCP, RRT, CHT Entered By: Weyman RodneyWallace, RCP,RRT,CHT, [name] on 04/06/2015 12:37:24

## 2015-04-09 ENCOUNTER — Encounter: Payer: Medicaid Other | Admitting: Surgery

## 2015-04-10 ENCOUNTER — Encounter: Payer: Medicaid Other | Admitting: Surgery

## 2015-04-10 DIAGNOSIS — L97523 Non-pressure chronic ulcer of other part of left foot with necrosis of muscle: Secondary | ICD-10-CM | POA: Diagnosis not present

## 2015-04-10 LAB — GLUCOSE, CAPILLARY
GLUCOSE-CAPILLARY: 221 mg/dL — AB (ref 65–99)
GLUCOSE-CAPILLARY: 151 mg/dL — AB (ref 65–99)

## 2015-04-10 NOTE — Progress Notes (Signed)
[name], [name] ([id number]) Visit Report for 04/10/2015 Arrival Information Details Patient Name: [name], [name] 04/10/2015 1:00 Date of Service: PM Medical Record [id number] Number: Patient Account Number: [id number] [birth year] (39 y.o. Treating RN: Date of Birth/Sex: Female) Other Clinician: Izetta DakinWallace, Sallie Primary Care Physician: Sherrie MustacheJADALI, FAYEGH Treating Britto, Errol Referring Physician: Sherrie MustacheJADALI, FAYEGH Physician/Extender: Tania AdeWeeks in Treatment: 18 Visit Information History Since Last Visit Added or deleted any medications: No Patient Arrived: Ambulatory Any new allergies or adverse reactions: No Arrival Time: 12:28 Had a fall or experienced change in No Accompanied By: self activities of daily living that may affect Transfer Assistance: None risk of falls: Patient Identification Verified: Yes Signs or symptoms of abuse/neglect No Secondary Verification Process Yes since last visito Completed: Hospitalized since last visit: No Patient Has Alerts: Yes Has Dressing in Place as Prescribed: Yes Patient Alerts: Type II Diabetic Has Footwear/Offloading in Place as Yes 11/30/2014 ABI: Prescribed: (L) 1.24 (R) Right: Surgical Shoe with 0.92 Pressure Relief Insole Pain Present Now: No Electronic Signature(s) Signed: 04/10/2015 3:32:39 PM By: Dayton MartesWallace, RCP,RRT,CHT, Sallie RCP, RRT, CHT Entered By: Weyman RodneyWallace, RCP,RRT,CHT, [name] on 04/10/2015 12:43:35 Yielding, [name] ([id number]) -------------------------------------------------------------------------------- Encounter Discharge Information Details Patient Name: [name], [name] 04/10/2015 1:00 Date of Service: PM Medical Record [id number] Number: Patient Account Number: [id number] [birth year] (39 y.o. Treating RN: Date of Birth/Sex: Female) Other Clinician: Izetta DakinWallace, Sallie Primary Care Physician: Sherrie MustacheJADALI, FAYEGH Treating Evlyn KannerBritto, Errol Referring Physician: Sherrie MustacheJADALI, FAYEGH Physician/Extender: Tania AdeWeeks in  Treatment: 18 Encounter Discharge Information Items Discharge Pain Level: 0 Discharge Condition: Stable Ambulatory Status: Ambulatory Discharge Destination: Home Private Transportation: Auto Accompanied By: self Schedule Follow-up Appointment: No Medication Reconciliation completed and No provided to Patient/Care [name]: Clinical Summary of Care: Notes Patient has an HBO treatment scheduled on 04/11/15 at 13:00 pm. Electronic Signature(s) Signed: 04/10/2015 3:32:39 PM By: Dayton MartesWallace, RCP,RRT,CHT, Sallie RCP, RRT, CHT Entered By: Dayton MartesWallace, RCP,RRT,CHT, Sallie on 04/10/2015 15:02:28 Losier, [name] ([id number]) -------------------------------------------------------------------------------- Vitals Details Patient Name: [name], [name] 04/10/2015 1:00 Date of Service: PM Medical Record [id number] Number: Patient Account Number: [id number] [birth year] (39 y.o. Treating RN: Date of Birth/Sex: Female) Other Clinician: Izetta DakinWallace, Sallie Primary Care Physician: Sherrie MustacheJADALI, FAYEGH Treating Britto, Errol Referring Physician: Sherrie MustacheJADALI, FAYEGH Physician/Extender: Tania AdeWeeks in Treatment: 18 Vital Signs Time Taken: 12:28 Temperature (F): 97.7 Height (in): 67 Pulse (bpm): 84 Weight (lbs): 290 Respiratory Rate (breaths/min): 18 Body Mass Index (BMI): 45.4 Blood Pressure (mmHg): 126/82 Capillary Blood Glucose (mg/dl): 536221 Reference Range: 80 - 120 mg / dl Electronic Signature(s) Signed: 04/10/2015 3:32:39 PM By: Dayton MartesWallace, RCP,RRT,CHT, Sallie RCP, RRT, CHT Entered By: Dayton MartesWallace, RCP,RRT,CHT, Sallie on 04/10/2015 12:43:23

## 2015-04-10 NOTE — Progress Notes (Signed)
[name], [name] ([id number]) Visit Report for 04/10/2015 HBO Details Patient Name: [name], [name] 04/10/2015 1:00 Date of Service: PM Medical Record [id number] Number: Patient Account Number: [id number] July 17, 1975 (39 y.o. Treating RN: Date of Birth/Sex: Female) Other Clinician: Izetta DakinWallace, Sallie Primary Care Physician: Sherrie MustacheJADALI, FAYEGH Treating Britto, Errol Referring Physician: Sherrie MustacheJADALI, FAYEGH Physician/Extender: Tania AdeWeeks in Treatment: 18 HBO Treatment Course Details Treatment Course Ordering Physician: Evlyn KannerBritto, Errol 1 Number: HBO Treatment Start Date: 02/19/2015 Total Treatments 30 Ordered: HBO Indication: Diabetic Ulcer(s) of the Lower Extremity HBO Treatment Details Treatment Number: 19 Patient Type: Outpatient Chamber Type: Monoplace Chamber #: [id number] Treatment Protocol: 2.0 ATA with 90 minutes oxygen, and no air breaks Treatment Details Compression Rate Down: 1.5 psi / minute De-Compression Rate Up: 1.5 psi / minute Air breaks and breathing Compress Tx Pressure Decompress Decompress periods Begins Reached Begins Ends (leave unused spaces blank) Chamber Pressure 1 ATA 2.0 ATA - - - - - - 2.0 ATA 1 ATA Clock Time (24 hr) 12:35 12:47 - - - - - - 14:18 14:28 Treatment Length: 113 (minutes) Treatment Segments: 4 Capillary Blood Glucose Pre Capillary Blood Glucose (mg/dl): Post Capillary Blood Glucose (mg/dl): Vital Signs Capillary Blood Glucose Reference Range: 80 - 120 mg / dl HBO Diabetic Blood Glucose Intervention Range: <131 mg/dl or >413>249 mg/dl Time Vitals Blood Respiratory Capillary Blood Glucose Pulse Action Type: Pulse: Temperature: Taken: Pressure: Rate: Glucose (mg/dl): Meter #: Oximetry (%) Taken: Pre 12:28 126/82 84 18 97.7 221 1 none Post 14:33 142/100 84 18 97.6 151 1 none Treatment Response [name], [name] ([id number]) Treatment Completion Status: Treatment Completed without Adverse Event HBO Attestation I certify that I  supervised this HBO treatment in accordance with Medicare guidelines. A trained Yes emergency response team is readily available per hospital policies and procedures. Continue HBOT as ordered. Yes Electronic Signature(s) Signed: 04/10/2015 3:39:20 PM By: Evlyn KannerBritto, Errol MD, FACS Previous Signature: 04/10/2015 3:32:39 PM Version By: Dayton MartesWallace, RCP,RRT,CHT, Sallie RCP, RRT, CHT Entered By: Evlyn KannerBritto, Errol on 04/10/2015 15:39:20 [name] FloridaLISA M. ([id number]) -------------------------------------------------------------------------------- HBO Safety Checklist Details Patient Name: [name], [name] 04/10/2015 1:00 Date of Service: PM Medical Record [id number] Number: Patient Account Number: [id number] July 17, 1975 (39 y.o. Treating RN: Date of Birth/Sex: Female) Other Clinician: Izetta DakinWallace, Sallie Primary Care Physician: Sherrie MustacheJADALI, FAYEGH Treating Britto, Errol Referring Physician: Sherrie MustacheJADALI, FAYEGH Physician/Extender: Tania AdeWeeks in Treatment: 18 HBO Safety Checklist Items Safety Checklist Consent Form Signed Patient voided / foley secured and emptied When did you last eato 20:00 pm on 04/09/15 Last dose of injectable or oral agent 09:00 am NA Ostomy pouch emptied and vented if applicable NA All implantable devices assessed, documented and approved Intravenous access site secured and place Valuables secured Linens and cotton and cotton/polyester blend (less than 51% polyester) Personal oil-based products / skin lotions / body lotions removed Wigs or hairpieces removed Smoking or tobacco materials removed Books / newspapers / magazines / loose paper removed Cologne, aftershave, perfume and deodorant removed Jewelry removed (may wrap wedding band) Make-up removed Hair care products removed Battery operated devices (external) removed Heating patches and chemical warmers removed NA Titanium eyewear removed Nail polish cured greater than 10 hours NA Casting material cured greater than 10  hours NA Hearing aids removed NA Loose dentures or partials removed NA Prosthetics have been removed Patient demonstrates correct use of air break device (if applicable) Patient concerns have been addressed Patient grounding bracelet on and cord attached to chamber Specifics for Inpatients (complete in addition to above) Medication sheet sent with patient [name], [name]  M. ([id number]) Intravenous medications needed or due during therapy sent with patient Drainage tubes (e.g. nasogastric tube or chest tube secured and vented) Endotracheal or Tracheotomy tube secured Cuff deflated of air and inflated with saline Airway suctioned Electronic Signature(s) Signed: 04/10/2015 3:32:39 PM By: Dayton Martes RCP, RRT, CHT Entered By: Dayton Martes on 04/10/2015 12:45:34

## 2015-04-11 ENCOUNTER — Encounter: Payer: Medicaid Other | Admitting: Surgery

## 2015-04-11 DIAGNOSIS — L97523 Non-pressure chronic ulcer of other part of left foot with necrosis of muscle: Secondary | ICD-10-CM | POA: Diagnosis not present

## 2015-04-11 LAB — GLUCOSE, CAPILLARY
GLUCOSE-CAPILLARY: 254 mg/dL — AB (ref 65–99)
GLUCOSE-CAPILLARY: 158 mg/dL — AB (ref 65–99)

## 2015-04-11 NOTE — Progress Notes (Signed)
[name], Preslee M. ([id number]) Visit Report for 04/11/2015 Arrival Information Details Patient Name: [name], [name] Date of Service: 04/11/2015 1:00 PM Medical Record Patient Account Number: [id number] Number: Treating RN: [birth year] (39 y.o. Other Clinician: Izetta DakinWallace, Sallie Date of Birth/Sex: Female) Treating BURNS III, Primary Care Physician: Sherrie MustacheJADALI, FAYEGH Physician/Extender: Zollie BeckersWALTER Referring Physician: Sherrie MustacheJADALI, FAYEGH Weeks in Treatment: 18 Visit Information History Since Last Visit Added or deleted any medications: No Patient Arrived: Ambulatory Signs or symptoms of abuse/neglect No Arrival Time: 12:45 since last visito Accompanied By: self Hospitalized since last visit: No Transfer Assistance: None Has Dressing in Place as Prescribed: Yes Patient Identification Verified: Yes Has Footwear/Offloading in Place as Yes Secondary Verification Process Yes Prescribed: Completed: Right: Surgical Shoe with Patient Has Alerts: Yes Pressure Relief Patient Alerts: Type II Diabetic Insole 11/30/2014 ABI: Pain Present Now: No (L) 1.24 (R) 0.92 Electronic Signature(s) Signed: 04/11/2015 3:26:14 PM By: Dayton MartesWallace, RCP,RRT,CHT, Sallie RCP, RRT, CHT Entered By: Dayton MartesWallace, RCP,RRT,CHT, Sallie on 04/11/2015 12:50:38 Snyders, [name] ([id number]) -------------------------------------------------------------------------------- Encounter Discharge Information Details Patient Name: [name], [name] Date of Service: 04/11/2015 1:00 PM Medical Record Patient Account Number: [id number] Number: Treating RN: [birth year] (39 y.o. Other Clinician: Izetta DakinWallace, Sallie Date of Birth/Sex: Female) Treating BURNS III, Primary Care Physician: Sherrie MustacheJADALI, FAYEGH Physician/Extender: Zollie BeckersWALTER Referring Physician: Sherrie MustacheJADALI, FAYEGH Weeks in Treatment: 4218 Encounter Discharge Information Items Discharge Pain Level: 0 Discharge Condition: Stable Ambulatory Status:  Ambulatory Discharge Destination: Home Private Transportation: Auto Accompanied By: self Schedule Follow-up Appointment: No Medication Reconciliation completed and No provided to Patient/Care Sue Fernicola: Clinical Summary of Care: Notes Patient has an HBO treatment scheduled on 04/12/15 at 13:00 pm. Electronic Signature(s) Signed: 04/11/2015 3:26:14 PM By: Dayton MartesWallace, RCP,RRT,CHT, Sallie RCP, RRT, CHT Entered By: Dayton MartesWallace, RCP,RRT,CHT, Sallie on 04/11/2015 15:25:56 Lucus, [name] ([id number]) -------------------------------------------------------------------------------- Vitals Details Patient Name: [name], [name] Date of Service: 04/11/2015 1:00 PM Medical Record Patient Account Number: [id number] Number: Treating RN: [birth year] (39 y.o. Other Clinician: Izetta DakinWallace, Sallie Date of Birth/Sex: Female) Treating BURNS III, Primary Care Physician: Sherrie MustacheJADALI, FAYEGH Physician/Extender: Zollie BeckersWALTER Referring Physician: Sherrie MustacheJADALI, FAYEGH Weeks in Treatment: 18 Vital Signs Time Taken: 12:46 Temperature (F): 98.2 Height (in): 67 Pulse (bpm): 96 Weight (lbs): 290 Respiratory Rate (breaths/min): 18 Body Mass Index (BMI): 45.4 Blood Pressure (mmHg): 138/92 Capillary Blood Glucose (mg/dl): 401254 Reference Range: 80 - 120 mg / dl Electronic Signature(s) Signed: 04/11/2015 3:26:14 PM By: Dayton MartesWallace, RCP,RRT,CHT, Sallie RCP, RRT, CHT Entered By: Weyman RodneyWallace, RCP,RRT,CHT, [name] on 04/11/2015 12:51:05

## 2015-04-12 ENCOUNTER — Ambulatory Visit: Payer: Medicaid Other | Admitting: Surgery

## 2015-04-12 ENCOUNTER — Encounter: Payer: Medicaid Other | Admitting: Surgery

## 2015-04-12 NOTE — Progress Notes (Addendum)
KAMBREA, CARRASCO ([id number]) Visit Report for 04/11/2015 HBO Details Patient Name: [name] [name] BABINEAU. Date of Service: 04/11/2015 1:00 PM Medical Record Patient Account Number: [id number] Number: Treating RN: 1975-07-05 (39 y.o. Other Clinician: [name] Date of Birth/Sex: Female) Treating BURNS III, Primary Care Physician: [name] Physician/Extender: Zollie Beckers Referring Physician: [name] Weeks in Treatment: 18 HBO Treatment Course Details Treatment Course Ordering Physician: [name] 1 Number: HBO Treatment Start Date: 02/19/2015 Total Treatments 30 Ordered: HBO Indication: Diabetic Ulcer(s) of the Lower Extremity HBO Treatment Details Treatment Number: 20 Patient Type: Outpatient Chamber Type: Monoplace Chamber #: [id number] Treatment Protocol: 2.0 ATA with 90 minutes oxygen, and no air breaks Treatment Details Compression Rate Down: 1.5 psi / minute De-Compression Rate Up: 1.5 psi / minute Air breaks and breathing Compress Tx Pressure Decompress Decompress periods Begins Reached Begins Ends (leave unused spaces blank) Chamber Pressure 1 ATA 2.0 ATA - - - - - - 2.0 ATA 1 ATA Clock Time (24 hr) 12:55 13:07 - - - - - - 14:37 14:47 Treatment Length: 112 (minutes) Treatment Segments: 4 Capillary Blood Glucose Pre Capillary Blood Glucose (mg/dl): Post Capillary Blood Glucose (mg/dl): Vital Signs Capillary Blood Glucose Reference Range: 80 - 120 mg / dl HBO Diabetic Blood Glucose Intervention Range: <131 mg/dl or >324 mg/dl Time Vitals Blood Respiratory Capillary Blood Glucose Pulse Action Type: Pulse: Temperature: Taken: Pressure: Rate: Glucose (mg/dl): Meter #: Oximetry (%) Taken: Pre 12:46 138/92 96 18 98.2 254 1 none Post 14:53 132/94 84 18 97.5 158 1 none Treatment Response [name] [name] M. ([id number]) Treatment Completion Status: Treatment Completed without Adverse Event HBO Attestation I certify that I  supervised this HBO treatment in accordance with Medicare guidelines. A trained Yes emergency response team is readily available per hospital policies and procedures. Continue HBOT as ordered. Yes Electronic Signature(s) Signed: 04/13/2015 8:16:59 AM By: Madelaine Bhat MD Previous Signature: 04/11/2015 3:26:14 PM Version By: Dayton Martes RCP, RRT, CHT Previous Signature: 04/12/2015 7:56:59 AM Version By: Madelaine Bhat MD Previous Signature: 04/11/2015 3:24:17 PM Version By: Madelaine Bhat MD Entered By: Madelaine Bhat on 04/13/2015 08:16:04 [name] [name] [name] ([id number]) -------------------------------------------------------------------------------- HBO Safety Checklist Details Patient Name: [name] [name] M. Date of Service: 04/11/2015 1:00 PM Medical Record Patient Account Number: [id number] Number: Treating RN: [birth year] (39 y.o. Other Clinician: [name] Date of Birth/Sex: Female) Treating BURNS III, Primary Care Physician: [name] Physician/Extender: Zollie Beckers Referring Physician: [name] Weeks in Treatment: 18 HBO Safety Checklist Items Safety Checklist Consent Form Signed Patient voided / foley secured and emptied When did you last eato 09:00 am Last dose of injectable or oral agent 09:15 am NA Ostomy pouch emptied and vented if applicable NA All implantable devices assessed, documented and approved Intravenous access site secured and place Valuables secured Linens and cotton and cotton/polyester blend (less than 51% polyester) Personal oil-based products / skin lotions / body lotions removed Wigs or hairpieces removed Smoking or tobacco materials removed Books / newspapers / magazines / loose paper removed Cologne, aftershave, perfume and deodorant removed Jewelry removed (may wrap wedding band) Make-up removed Hair care products removed Battery operated devices (external) removed Heating  patches and chemical warmers removed NA Titanium eyewear removed Nail polish cured greater than 10 hours NA Casting material cured greater than 10 hours NA Hearing aids removed NA Loose dentures or partials removed NA Prosthetics have been removed Patient demonstrates correct use of air break device (if applicable) Patient concerns have been addressed Patient  grounding bracelet on and cord attached to chamber Specifics for Inpatients (complete in addition to above) Medication sheet sent with patient [name] [name] FloridaLISA M. ([id number]) Intravenous medications needed or due during therapy sent with patient Drainage tubes (e.g. nasogastric tube or chest tube secured and vented) Endotracheal or Tracheotomy tube secured Cuff deflated of air and inflated with saline Airway suctioned Electronic Signature(s) Signed: 04/11/2015 3:26:14 PM By: Dayton MartesWallace, RCP,RRT,CHT, [name] RCP, RRT, CHT Entered By: Dayton MartesWallace, RCP,RRT,CHT, [name] on 04/11/2015 12:51:52

## 2015-04-13 ENCOUNTER — Ambulatory Visit: Payer: Medicaid Other | Admitting: General Surgery

## 2015-04-13 ENCOUNTER — Ambulatory Visit (HOSPITAL_BASED_OUTPATIENT_CLINIC_OR_DEPARTMENT_OTHER): Payer: Medicaid Other | Admitting: General Surgery

## 2015-04-13 DIAGNOSIS — E11621 Type 2 diabetes mellitus with foot ulcer: Secondary | ICD-10-CM | POA: Diagnosis not present

## 2015-04-13 DIAGNOSIS — M869 Osteomyelitis, unspecified: Secondary | ICD-10-CM | POA: Diagnosis not present

## 2015-04-13 DIAGNOSIS — E1169 Type 2 diabetes mellitus with other specified complication: Secondary | ICD-10-CM | POA: Diagnosis not present

## 2015-04-13 DIAGNOSIS — L97509 Non-pressure chronic ulcer of other part of unspecified foot with unspecified severity: Secondary | ICD-10-CM | POA: Diagnosis not present

## 2015-04-13 NOTE — Progress Notes (Signed)
seeiheal 

## 2015-04-16 ENCOUNTER — Encounter: Payer: Medicaid Other | Admitting: Surgery

## 2015-04-17 ENCOUNTER — Encounter: Payer: Medicaid Other | Admitting: Surgery

## 2015-04-17 DIAGNOSIS — L97523 Non-pressure chronic ulcer of other part of left foot with necrosis of muscle: Secondary | ICD-10-CM | POA: Diagnosis not present

## 2015-04-17 LAB — GLUCOSE, CAPILLARY
Glucose-Capillary: 186 mg/dL — ABNORMAL HIGH (ref 65–99)
Glucose-Capillary: 128 mg/dL — ABNORMAL HIGH (ref 65–99)

## 2015-04-18 ENCOUNTER — Encounter: Payer: Medicaid Other | Admitting: Surgery

## 2015-04-18 NOTE — Progress Notes (Signed)
[name] ChristineSATTERFIELD, Tatiyanna M. ([id number]) Visit Report for 04/17/2015 Problem List Details Patient Name: [name] ChristineSATTERFIELD, [name] 04/17/2015 1:00 Date of Service: PM Medical Record [id number] Number: Patient Account Number: [id number] 28-May-1976 (39 y.o. 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 ([id number]) Electronic Signature(s) Signed: 04/17/2015 3:31:45 PM By: Evlyn KannerBritto, Eliel Dudding MD, FACS Entered By: Evlyn KannerBritto, Jalesia Loudenslager on 04/17/2015 15:31:45 Neff, [name] ([id number]) -------------------------------------------------------------------------------- SuperBill Details Patient Name: Imber, [name] Date of Service: 04/17/2015 Medical Record Number: [id number] Patient Account Number: [id number] Date of Birth/Sex: 28-May-1976 (39 y.o. Female) Treating RN: Primary Care Physician: Sherrie MustacheJADALI, FAYEGH Other Clinician: Izetta DakinWallace, Sallie Referring Physician: Sherrie MustacheJADALI, FAYEGH Treating Physician/Extender: Rudene ReBritto, Keiland Pickering Weeks in Treatment: 19 Diagnosis Coding ICD-10 Codes Code Description E11.621 Type 2 diabetes mellitus with foot ulcer E08.40 Diabetes  mellitus due to underlying condition with diabetic neuropathy, unspecified L97.513 Non-pressure chronic ulcer of other part of right foot with necrosis of muscle F17.218 Nicotine dependence, cigarettes, with other nicotine-induced disorders E66.01 Morbid (severe) obesity due to excess calories L03.116 Cellulitis of left lower limb 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

## 2015-04-18 NOTE — Progress Notes (Signed)
[name], Trace M. ([id number]) Visit Report for 04/17/2015 HBO Details Patient Name: [name], Vincent M. 04/17/2015 1:00 Date of Service: PM Medical Record [id number] Number: Patient Account Number: [id number] July 14, 1975 ([age] y.o. Treating RN: Date of Birth/Sex: Female) Other Clinician: Izetta DakinWallace, Sallie Primary Care Physician: Sherrie MustacheJADALI, FAYEGH Treating Britto, Errol Referring Physician: Sherrie MustacheJADALI, FAYEGH Physician/Extender: Tania AdeWeeks in Treatment: 19 HBO Treatment Course Details Treatment Course Ordering Physician: Evlyn KannerBritto, Errol 1 Number: HBO Treatment Start Date: 02/19/2015 Total Treatments 30 Ordered: HBO Indication: Diabetic Ulcer(s) of the Lower Extremity HBO Treatment Details Treatment Number: 21 Patient Type: Outpatient Chamber Type: Monoplace Chamber #: [id number] Treatment Protocol: 2.0 ATA with 90 minutes oxygen, and no air breaks Treatment Details Compression Rate Down: 1.5 psi / minute De-Compression Rate Up: 1.5 psi / minute Air breaks and breathing Compress Tx Pressure Decompress Decompress periods Begins Reached Begins Ends (leave unused spaces blank) Chamber Pressure 1 ATA 2.0 ATA - - - - - - 2.0 ATA 1 ATA Clock Time (24 hr) 13:01 13:13 - - - - - - 14:43 14:53 Treatment Length: 112 (minutes) Treatment Segments: 4 Capillary Blood Glucose Pre Capillary Blood Glucose (mg/dl): Post Capillary Blood Glucose (mg/dl): Vital Signs Capillary Blood Glucose Reference Range: 80 - 120 mg / dl HBO Diabetic Blood Glucose Intervention Range: <131 mg/dl or >188>249 mg/dl Time Vitals Blood Respiratory Capillary Blood Glucose Pulse Action Type: Pulse: Temperature: Taken: Pressure: Rate: Glucose (mg/dl): Meter #: Oximetry (%) Taken: Pre 12:52 136/94 84 18 98.4 186 1 none Post 14:59 146/104 84 18 98 128 1 none Treatment Response Sprankle, Marisal M. ([id number]) Treatment Completion Status: Treatment Completed without Adverse Event HBO Attestation I certify that I  supervised this HBO treatment in accordance with Medicare guidelines. A trained Yes emergency response team is readily available per hospital policies and procedures. Continue HBOT as ordered. Yes Electronic Signature(s) Signed: 04/17/2015 3:32:12 PM By: Evlyn KannerBritto, Errol MD, FACS Previous Signature: 04/17/2015 1:38:48 PM Version By: Evlyn KannerBritto, Errol MD, FACS Entered By: Evlyn KannerBritto, Errol on 04/17/2015 15:32:12 Wickizer, [name] ([id number]) -------------------------------------------------------------------------------- HBO Safety Checklist Details Patient Name: [name], Keymani M. 04/17/2015 1:00 Date of Service: PM Medical Record [id number] Number: Patient Account Number: [id number] July 14, 1975 (39 y.o. Treating RN: Date of Birth/Sex: Female) Other Clinician: Izetta DakinWallace, Sallie Primary Care Physician: Sherrie MustacheJADALI, FAYEGH Treating Britto, Errol Referring Physician: Sherrie MustacheJADALI, FAYEGH Physician/Extender: Tania AdeWeeks in Treatment: 19 HBO Safety Checklist Items Safety Checklist Consent Form Signed Patient voided / foley secured and emptied When did you last eato 20:00 pm on 04/16/15 Last dose of injectable or oral agent 08:00 am NA Ostomy pouch emptied and vented if applicable NA All implantable devices assessed, documented and approved Intravenous access site secured and place Valuables secured Linens and cotton and cotton/polyester blend (less than 51% polyester) Personal oil-based products / skin lotions / body lotions removed Wigs or hairpieces removed Smoking or tobacco materials removed Books / newspapers / magazines / loose paper removed Cologne, aftershave, perfume and deodorant removed Jewelry removed (may wrap wedding band) Make-up removed Hair care products removed Battery operated devices (external) removed Heating patches and chemical warmers removed NA Titanium eyewear removed Nail polish cured greater than 10 hours NA Casting material cured greater than 10 hours NA Hearing aids  removed NA Loose dentures or partials removed NA Prosthetics have been removed Patient demonstrates correct use of air break device (if applicable) Patient concerns have been addressed Patient grounding bracelet on and cord attached to chamber Specifics for Inpatients (complete in addition to above) Medication sheet sent with patient [name], Rosey M. ([id number])  Intravenous medications needed or due during therapy sent with patient Drainage tubes (e.g. nasogastric tube or chest tube secured and vented) Endotracheal or Tracheotomy tube secured Cuff deflated of air and inflated with saline Airway suctioned Electronic Signature(s) Signed: 04/17/2015 4:44:57 PM By: Dayton Martes RCP, RRT, CHT Entered By: Dayton Martes on 04/17/2015 13:04:05

## 2015-04-18 NOTE — Progress Notes (Signed)
[name], [name] ([id number]) Visit Report for 04/17/2015 Arrival Information Details Patient Name: [name], [name] 04/17/2015 1:00 Date of Service: PM Medical Record [id number] Number: Patient Account Number: [id number] Sep 19, 1975 (39 y.o. Treating RN: Date of Birth/Sex: Female) Other Clinician: Izetta DakinWallace, Sallie Primary Care Physician: Sherrie MustacheJADALI, FAYEGH Treating Britto, Errol Referring Physician: Sherrie MustacheJADALI, FAYEGH Physician/Extender: Tania AdeWeeks in Treatment: 19 Visit Information History Since Last Visit Added or deleted any medications: No Patient Arrived: Ambulatory Any new allergies or adverse reactions: No Arrival Time: 12:52 Had a fall or experienced change in No Accompanied By: self activities of daily living that may affect Transfer Assistance: None risk of falls: Patient Identification Verified: Yes Signs or symptoms of abuse/neglect No Secondary Verification Process Yes since last visito Completed: Hospitalized since last visit: No Patient Has Alerts: Yes Has Dressing in Place as Prescribed: Yes Patient Alerts: Type II Diabetic Has Footwear/Offloading in Place as Yes 11/30/2014 ABI: Prescribed: (L) 1.24 (R) Left: Surgical Shoe with 0.92 Pressure Relief Insole Right: Surgical Shoe with Pressure Relief Insole Pain Present Now: No Electronic Signature(s) Signed: 04/17/2015 4:44:57 PM By: Dayton MartesWallace, RCP,RRT,CHT, Sallie RCP, RRT, CHT Entered By: Dayton MartesWallace, RCP,RRT,CHT, Sallie on 04/17/2015 13:02:08 Keithly, [name] ([id number]) -------------------------------------------------------------------------------- Encounter Discharge Information Details Patient Name: [name], [name] 04/17/2015 1:00 Date of Service: PM Medical Record [id number] Number: Patient Account Number: [id number] Sep 19, 1975 (39 y.o. Treating RN: Date of Birth/Sex: Female) Other Clinician: Izetta DakinWallace, Sallie Primary Care Physician: Sherrie MustacheJADALI, FAYEGH Treating Evlyn KannerBritto, Errol Referring Physician:  Sherrie MustacheJADALI, FAYEGH Physician/Extender: Tania AdeWeeks in Treatment: 19 Encounter Discharge Information Items Discharge Pain Level: 0 Discharge Condition: Stable Ambulatory Status: Ambulatory Discharge Destination: Home Private Transportation: Auto Accompanied By: self Schedule Follow-up Appointment: No Medication Reconciliation completed and No provided to Patient/Care Money Mckeithan: Clinical Summary of Care: Notes Patient has an HBO treatment scheduled on 04/18/15 at 13:00 pm. Electronic Signature(s) Signed: 04/17/2015 4:44:57 PM By: Dayton MartesWallace, RCP,RRT,CHT, Sallie RCP, RRT, CHT Entered By: Dayton MartesWallace, RCP,RRT,CHT, Sallie on 04/17/2015 15:10:53 Campoverde, [name] ([id number]) -------------------------------------------------------------------------------- Vitals Details Patient Name: [name], [name] 04/17/2015 1:00 Date of Service: PM Medical Record [id number] Number: Patient Account Number: [id number] Sep 19, 1975 (39 y.o. Treating RN: Date of Birth/Sex: Female) Other Clinician: Izetta DakinWallace, Sallie Primary Care Physician: Sherrie MustacheJADALI, FAYEGH Treating Britto, Errol Referring Physician: Sherrie MustacheJADALI, FAYEGH Physician/Extender: Tania AdeWeeks in Treatment: 19 Vital Signs Time Taken: 12:52 Temperature (F): 98.4 Height (in): 67 Pulse (bpm): 84 Weight (lbs): 290 Respiratory Rate (breaths/min): 18 Body Mass Index (BMI): 45.4 Blood Pressure (mmHg): 136/94 Capillary Blood Glucose (mg/dl): 606186 Reference Range: 80 - 120 mg / dl Electronic Signature(s) Signed: 04/17/2015 4:44:57 PM By: Dayton MartesWallace, RCP,RRT,CHT, Sallie RCP, RRT, CHT Entered By: Dayton MartesWallace, RCP,RRT,CHT, Sallie on 04/17/2015 13:02:38

## 2015-04-19 ENCOUNTER — Encounter: Payer: Medicaid Other | Admitting: Surgery

## 2015-04-19 DIAGNOSIS — L97523 Non-pressure chronic ulcer of other part of left foot with necrosis of muscle: Secondary | ICD-10-CM | POA: Diagnosis not present

## 2015-04-19 LAB — GLUCOSE, CAPILLARY
Glucose-Capillary: 241 mg/dL — ABNORMAL HIGH (ref 65–99)
Glucose-Capillary: 196 mg/dL — ABNORMAL HIGH (ref 65–99)

## 2015-04-19 NOTE — Progress Notes (Signed)
[name], Hina M. ([id number]) Visit Report for 04/19/2015 Arrival Information Details Patient Name: [name], [name] 04/19/2015 1:00 Date of Service: PM Medical Record [id number] Number: Patient Account Number: [id number] 1975/10/22 (39 y.o. Treating RN: Date of Birth/Sex: Female) Other Clinician: Izetta DakinWallace, Sallie Primary Care Physician: Sherrie MustacheJADALI, FAYEGH Treating Britto, Errol Referring Physician: Sherrie MustacheJADALI, FAYEGH Physician/Extender: Tania AdeWeeks in Treatment: 20 Visit Information History Since Last Visit Added or deleted any medications: No Patient Arrived: Ambulatory Any new allergies or adverse reactions: No Arrival Time: 12:45 Had a fall or experienced change in No Accompanied By: self activities of daily living that may affect Transfer Assistance: None risk of falls: Patient Identification Verified: Yes Signs or symptoms of abuse/neglect No Secondary Verification Process Yes since last visito Completed: Hospitalized since last visit: No Patient Has Alerts: Yes Has Dressing in Place as Prescribed: Yes Patient Alerts: Type II Diabetic Has Footwear/Offloading in Place as Yes 11/30/2014 ABI: Prescribed: (L) 1.24 (R) Left: Surgical Shoe with 0.92 Pressure Relief Insole Right: Surgical Shoe with Pressure Relief Insole Pain Present Now: No Electronic Signature(s) Signed: 04/19/2015 3:11:14 PM By: Dayton MartesWallace, RCP,RRT,CHT, Sallie RCP, RRT, CHT Entered By: Weyman RodneyWallace, RCP,RRT,CHT, [name] on 04/19/2015 12:51:33 Dimitri, [name] ([id number]) -------------------------------------------------------------------------------- Encounter Discharge Information Details Patient Name: [name], [name] 04/19/2015 1:00 Date of Service: PM Medical Record [id number] Number: Patient Account Number: [id number] 1975/10/22 (39 y.o. Treating RN: Date of Birth/Sex: Female) Other Clinician: Izetta DakinWallace, Sallie Primary Care Physician: Sherrie MustacheJADALI, FAYEGH Treating Evlyn KannerBritto, Errol Referring Physician:  Sherrie MustacheJADALI, FAYEGH Physician/Extender: Tania AdeWeeks in Treatment: 20 Encounter Discharge Information Items Discharge Pain Level: 0 Discharge Condition: Stable Ambulatory Status: Ambulatory Discharge Destination: Home Private Transportation: Auto Accompanied By: self Schedule Follow-up Appointment: No Medication Reconciliation completed and No provided to Patient/Care Damontay Alred: Clinical Summary of Care: Notes Patient has an HBO treatment scheduled on 04/20/15 at 13:00 pm. Electronic Signature(s) Signed: 04/19/2015 3:11:14 PM By: Dayton MartesWallace, RCP,RRT,CHT, Sallie RCP, RRT, CHT Entered By: Dayton MartesWallace, RCP,RRT,CHT, Sallie on 04/19/2015 15:10:50 Deviney, [name] ([id number]) -------------------------------------------------------------------------------- Vitals Details Patient Name: [name], [name] 04/19/2015 1:00 Date of Service: PM Medical Record [id number] Number: Patient Account Number: [id number] 1975/10/22 (39 y.o. Treating RN: Date of Birth/Sex: Female) Other Clinician: Izetta DakinWallace, Sallie Primary Care Physician: Sherrie MustacheJADALI, FAYEGH Treating Britto, Errol Referring Physician: Sherrie MustacheJADALI, FAYEGH Physician/Extender: Tania AdeWeeks in Treatment: 20 Vital Signs Time Taken: 12:46 Temperature (F): 98.0 Height (in): 67 Pulse (bpm): 90 Weight (lbs): 290 Respiratory Rate (breaths/min): 18 Body Mass Index (BMI): 45.4 Blood Pressure (mmHg): 142/88 Capillary Blood Glucose (mg/dl): 742241 Reference Range: 80 - 120 mg / dl Electronic Signature(s) Signed: 04/19/2015 3:11:14 PM By: Dayton MartesWallace, RCP,RRT,CHT, Sallie RCP, RRT, CHT Entered By: Dayton MartesWallace, RCP,RRT,CHT, Sallie on 04/19/2015 12:51:56

## 2015-04-20 ENCOUNTER — Encounter: Payer: Medicaid Other | Admitting: Surgery

## 2015-04-20 DIAGNOSIS — L97523 Non-pressure chronic ulcer of other part of left foot with necrosis of muscle: Secondary | ICD-10-CM | POA: Diagnosis not present

## 2015-04-20 LAB — GLUCOSE, CAPILLARY
GLUCOSE-CAPILLARY: 292 mg/dL — AB (ref 65–99)
Glucose-Capillary: 214 mg/dL — ABNORMAL HIGH (ref 65–99)

## 2015-04-20 NOTE — Progress Notes (Signed)
DAYANNE, [name] ([id number]) Visit Report for 04/19/2015 HBO Details Patient Name: TIMIYA, HOWELLS 04/19/2015 1:00 Date of Service: PM Medical Record [id number] Number: Patient Account Number: [id number] 1975/06/25 (39 y.o. Treating RN: Date of Birth/Sex: Female) Other Clinician: [name] Primary Care Physician: [name] Treating Britto, Errol Referring Physician: [name] Physician/Extender: [name] in Treatment: 20 HBO Treatment Course Details Treatment Course Ordering Physician: [name] 1 Number: HBO Treatment Start Date: 02/19/2015 Total Treatments 30 Ordered: HBO Indication: Diabetic Ulcer(s) of the Lower Extremity HBO Treatment Details Treatment Number: 22 Patient Type: Outpatient Chamber Type: Monoplace Chamber #: [id number] Treatment Protocol: 2.0 ATA with 90 minutes oxygen, and no air breaks Treatment Details Compression Rate Down: 1.5 psi / minute De-Compression Rate Up: 1.5 psi / minute Air breaks and breathing Compress Tx Pressure Decompress Decompress periods Begins Reached Begins Ends (leave unused spaces blank) Chamber Pressure 1 ATA 2.0 ATA - - - - - - 2.0 ATA 1 ATA Clock Time (24 hr) 12:56 13:11 - - - - - - 14:41 14:54 Treatment Length: 118 (minutes) Treatment Segments: 4 Capillary Blood Glucose Pre Capillary Blood Glucose (mg/dl): Post Capillary Blood Glucose (mg/dl): Vital Signs Capillary Blood Glucose Reference Range: 80 - 120 mg / dl HBO Diabetic Blood Glucose Intervention Range: <131 mg/dl or >865 mg/dl Time Vitals Blood Respiratory Capillary Blood Glucose Pulse Action Type: Pulse: Temperature: Taken: Pressure: Rate: Glucose (mg/dl): Meter #: Oximetry (%) Taken: Pre 12:46 142/88 90 18 98 241 1 none Post 14:58 142/98 90 18 98 196 1 none Treatment Response Carlini, Darshana M. ([id number]) Treatment Completion Status: Treatment Completed without Adverse Event HBO Attestation I certify that I supervised  this HBO treatment in accordance with Medicare guidelines. A trained Yes emergency response team is readily available per hospital policies and procedures. Continue HBOT as ordered. Yes Electronic Signature(s) Signed: 04/19/2015 3:50:04 PM By: [name] MD, FACS Previous Signature: 04/19/2015 3:11:14 PM Version By: Dayton Martes RCP, RRT, CHT Entered By: [name] on 04/19/2015 15:50:04 Gilcrease, [name] ([id number]) -------------------------------------------------------------------------------- HBO Safety Checklist Details Patient Name: [name]. 04/19/2015 1:00 Date of Service: PM Medical Record [id number] Number: Patient Account Number: [id number] 1976-02-12 (39 y.o. Treating RN: Date of Birth/Sex: Female) Other Clinician: [name] Primary Care Physician: [name] Treating Britto, Errol Referring Physician: [name] Physician/Extender: [name] in Treatment: 20 HBO Safety Checklist Items Safety Checklist Consent Form Signed Patient voided / foley secured and emptied When did you last eato 18:00 pm on 04/18/15 Last dose of injectable or oral agent 08:00 am NA Ostomy pouch emptied and vented if applicable NA All implantable devices assessed, documented and approved Intravenous access site secured and place Valuables secured Linens and cotton and cotton/polyester blend (less than 51% polyester) Personal oil-based products / skin lotions / body lotions removed Wigs or hairpieces removed Smoking or tobacco materials removed Books / newspapers / magazines / loose paper removed Cologne, aftershave, perfume and deodorant removed Jewelry removed (may wrap wedding band) Make-up removed Hair care products removed Battery operated devices (external) removed Heating patches and chemical warmers removed NA Titanium eyewear removed Nail polish cured greater than 10 hours NA Casting material cured greater than 10 hours NA  Hearing aids removed NA Loose dentures or partials removed NA Prosthetics have been removed Patient demonstrates correct use of air break device (if applicable) Patient concerns have been addressed Patient grounding bracelet on and cord attached to chamber Specifics for Inpatients (complete in addition to above) Medication sheet sent with patient Sesma, Consuelo  M. ([id number]) Intravenous medications needed or due during therapy sent with patient Drainage tubes (e.g. nasogastric tube or chest tube secured and vented) Endotracheal or Tracheotomy tube secured Cuff deflated of air and inflated with saline Airway suctioned Electronic Signature(s) Signed: 04/19/2015 3:11:14 PM By: Dayton MartesWallace, RCP,RRT,CHT, Sallie RCP, RRT, CHT Entered By: Dayton MartesWallace, RCP,RRT,CHT, Sallie on 04/19/2015 12:53:01

## 2015-04-20 NOTE — Progress Notes (Signed)
[name], Tenzin M. ([id number]) Visit Report for 04/20/2015 Arrival Information Details Patient Name: [name], [name] 04/20/2015 1:00 Date of Service: PM Medical Record [id number] Number: Patient Account Number: [id number] [birth year] (39 y.o. Treating RN: Date of Birth/Sex: Female) Other Clinician: Izetta DakinWallace, Sallie Primary Care Physician: Sherrie MustacheJADALI, [name] Treating [name], [name] Referring Physician: Sherrie MustacheJADALI, [name] Physician/Extender: Tania AdeWeeks in Treatment: 20 Visit Information History Since Last Visit Added or deleted any medications: No Patient Arrived: Ambulatory Any new allergies or adverse reactions: No Arrival Time: 12:50 Had a fall or experienced change in No Accompanied By: self activities of daily living that may affect Transfer Assistance: None risk of falls: Patient Identification Verified: Yes Signs or symptoms of abuse/neglect No Secondary Verification Process Yes since last visito Completed: Hospitalized since last visit: No Patient Has Alerts: Yes Has Dressing in Place as Prescribed: Yes Patient Alerts: Type II Diabetic Has Footwear/Offloading in Place as Yes 11/30/2014 ABI: Prescribed: (L) 1.24 (R) Left: Surgical Shoe with 0.92 Pressure Relief Insole Right: Surgical Shoe with Pressure Relief Insole Pain Present Now: No Electronic Signature(s) Signed: 04/20/2015 3:21:04 PM By: Dayton MartesWallace, RCP,RRT,CHT, Sallie RCP, RRT, CHT Entered By: Dayton MartesWallace, RCP,RRT,CHT, Sallie on 04/20/2015 13:02:58 [place]wine, [name] ([id number]) -------------------------------------------------------------------------------- Encounter Discharge Information Details Patient Name: [name], [name] 04/20/2015 1:00 Date of Service: PM Medical Record [id number] Number: Patient Account Number: [id number] [birth year] (39 y.o. Treating RN: Date of Birth/Sex: Female) Other Clinician: Izetta DakinWallace, Sallie Primary Care Physician: Sherrie MustacheJADALI, [name] Treating Evlyn KannerBritto, [name] Referring Physician:  Sherrie MustacheJADALI, [name] Physician/Extender: Tania AdeWeeks in Treatment: 20 Encounter Discharge Information Items Discharge Pain Level: 0 Discharge Condition: Stable Ambulatory Status: Ambulatory Discharge Destination: Home Private Transportation: Auto Accompanied By: self Schedule Follow-up Appointment: No Medication Reconciliation completed and No provided to Patient/Care [name]: Clinical Summary of Care: Notes Patient has an HBO treatment scheduled on 04/23/15 at 13:00 pm. Electronic Signature(s) Signed: 04/20/2015 3:21:04 PM By: Dayton MartesWallace, RCP,RRT,CHT, Sallie RCP, RRT, CHT Entered By: Dayton MartesWallace, RCP,RRT,CHT, Sallie on 04/20/2015 15:20:07 Brisendine, [name] ([id number]) -------------------------------------------------------------------------------- Vitals Details Patient Name: [name], [name] 04/20/2015 1:00 Date of Service: PM Medical Record [id number] Number: Patient Account Number: [id number] [birth year] (39 y.o. Treating RN: Date of Birth/Sex: Female) Other Clinician: Izetta DakinWallace, Sallie Primary Care Physician: Sherrie MustacheJADALI, [name] Treating [name], [name] Referring Physician: Sherrie MustacheJADALI, [name] Physician/Extender: Tania AdeWeeks in Treatment: 20 Vital Signs Time Taken: 12:52 Temperature (F): 97.6 Height (in): 67 Pulse (bpm): 84 Weight (lbs): 290 Respiratory Rate (breaths/min): 18 Body Mass Index (BMI): 45.4 Blood Pressure (mmHg): 138/92 Capillary Blood Glucose (mg/dl): 595292 Reference Range: 80 - 120 mg / dl Electronic Signature(s) Signed: 04/20/2015 3:21:04 PM By: Dayton MartesWallace, RCP,RRT,CHT, Sallie RCP, RRT, CHT Entered By: Weyman RodneyWallace, RCP,RRT,CHT, [name] on 04/20/2015 13:03:24

## 2015-04-21 ENCOUNTER — Other Ambulatory Visit
Admission: RE | Admit: 2015-04-21 | Discharge: 2015-04-21 | Disposition: A | Discharge status: Home / Self Care | Payer: Medicaid Other | Source: Ambulatory Visit | Attending: Infectious Diseases | Admitting: Infectious Diseases

## 2015-04-21 DIAGNOSIS — M86172 Other acute osteomyelitis, left ankle and foot: Secondary | ICD-10-CM | POA: Insufficient documentation

## 2015-04-21 LAB — CBC WITH DIFFERENTIAL/PLATELET
BASOS ABS: 0.1 10*3/uL (ref 0–0.1)
Basophils Relative: 1 %
EOS PCT: 6 %
Eosinophils Absolute: 0.6 10*3/uL (ref 0–0.7)
HEMATOCRIT: 39.5 % (ref 35.0–47.0)
Hemoglobin: 13.9 g/dL (ref 12.0–16.0)
LYMPHS ABS: 2.2 10*3/uL (ref 1.0–3.6)
LYMPHS PCT: 23 %
MCH: 31.5 pg (ref 26.0–34.0)
MCHC: 35.2 g/dL (ref 32.0–36.0)
MCV: 89.6 fL (ref 80.0–100.0)
Monocytes Absolute: 0.5 10*3/uL (ref 0.2–0.9)
Monocytes Relative: 6 %
NEUTROS ABS: 6.2 10*3/uL (ref 1.4–6.5)
Neutrophils Relative %: 64 %
PLATELETS: 289 10*3/uL (ref 150–440)
RBC: 4.41 MIL/uL (ref 3.80–5.20)
RDW: 13.5 % (ref 11.5–14.5)
WBC: 9.7 10*3/uL (ref 3.6–11.0)

## 2015-04-21 LAB — COMPREHENSIVE METABOLIC PANEL
ALT: 16 U/L (ref 14–54)
AST: 20 U/L (ref 15–41)
Albumin: 3.6 g/dL (ref 3.5–5.0)
Alkaline Phosphatase: 68 U/L (ref 38–126)
Anion gap: 7 (ref 5–15)
BILIRUBIN TOTAL: 0.6 mg/dL (ref 0.3–1.2)
BUN: 10 mg/dL (ref 6–20)
CALCIUM: 8.5 mg/dL — AB (ref 8.9–10.3)
CO2: 27 mmol/L (ref 22–32)
CREATININE: 0.53 mg/dL (ref 0.44–1.00)
Chloride: 103 mmol/L (ref 101–111)
GFR calc Af Amer: >60 mL/min (ref >60)
GLUCOSE: 236 mg/dL — AB (ref 65–99)
POTASSIUM: 3.6 mmol/L (ref 3.5–5.1)
SODIUM: 137 mmol/L (ref 135–145)
TOTAL PROTEIN: 6.7 g/dL (ref 6.5–8.1)

## 2015-04-21 LAB — C-REACTIVE PROTEIN: CRP: 1.2 mg/dL — AB (ref <1.0)

## 2015-04-21 NOTE — Progress Notes (Signed)
[name] [name], [name] ([id number]) Visit Report for 04/20/2015 Chief Complaint Document Details [name] Name: [name] 04/20/2015 3:15 Date of Service: PM Medical Record [id number] Number: [name] Account Number: [id number] [birth year] (39 y.o. Treating RN: [name] Date of Birth/Sex: Female) Other Clinician: Primary Care Physician: [name] Treating [name] Referring Physician: Casilda [name] Physician/Extender: [name] Chief Complaint Patients presents for [name] of an open diabetic ulcer. The [name] has had an ulcerated wound on her left lower extremity for about 2 years. Electronic Signature(s) Signed: 04/20/2015 3:35:52 PM By: [name] Fudge MD, FACS Entered By: [name] on 04/20/2015 15:35:52 [name], [name] [name] ([id number]) -------------------------------------------------------------------------------- Debridement Details [name] Name: [name] [name] 04/20/2015 3:15 Date of Service: PM Medical Record [id number] Number: [name] Account Number: [id number] Apr 07, 1976 (39 y.o. Treating RN: [name] Date of Birth/Sex: Female) Other Clinician: Primary Care Physician: [name] Treating [name] Referring Physician: Casilda [name] Physician/Extender: [name]: 20 Debridement Performed for Wound #1 Left,Lateral,Plantar Foot Assessment: Performed By: Physician [name] Fudge, MD Debridement: Debridement Pre-procedure Yes Verification/Time Out Taken: Start Time: 15:27 Pain Control: Lidocaine 4% Topical Solution Level: Skin/Subcutaneous Tissue Total Area Debrided (L x 0.7 (cm) x 0.4 (cm) = 0.28 (cm) W): Tissue and other Viable, Non-Viable, Callus, Fibrin/Slough, Skin, Subcutaneous material debrided: Instrument: Forceps, Scissors Bleeding: Minimum Hemostasis Achieved: Pressure End Time: 15:29 Procedural Pain: 0 Post Procedural Pain: 0 Post  Debridement Measurements of Total Wound Length: (cm) 0.8 Width: (cm) 0.8 Depth: (cm) 1.3 Volume: (cm) 0.653 Post Procedure Diagnosis Same as Pre-procedure Electronic Signature(s) Signed: 04/20/2015 3:35:44 PM By: [name] Fudge MD, FACS Signed: 04/20/2015 5:07:53 PM By: [name] Entered By: [name] on 04/20/2015 15:35:43 Geving, [name] [name] ([id number]) -------------------------------------------------------------------------------- HPI Details [name] Name: [name] [name], [name] [name] 04/20/2015 3:15 Date of Service: PM Medical Record [id number] Number: [name] Account Number: [id number] Sep 28, 1975 (39 y.o. Treating RN: [name] Date of Birth/Sex: Female) Other Clinician: Primary Care Physician: [name] Treating [name] Referring Physician: Casilda [name] Physician/Extender: Weeks in [name]: 20 History of Present Illness Location: left foot ulcer Quality: [name] reports No Pain. Severity: Wound is moderately severed with some exposed structure in the wound bed. Duration: [name] has had the wound for > 2 years prior to seeking [name] at the wound center Timing: for the last 2 years she has been treated at the [facility] wound care center Context: The wound appeared gradually over time Modifying Factors: Other [name](s) tried include:in the last month she has been operated on the left foot for an abscess of that foot and was in hospital for 2 weeks. Associated Signs and Symptoms: Periwound excoriated and wound draining heavily HPI Description: 39 year old [name] who is known to be a diabetic comes from the office of Dr. Rosario Jacks for a ulcer on the left lower extremity and foot. She has had an ulcer on the left foot and has had previous [name] at [facility]. the [name] tells [place] that she has been seen as the outpatient center for a long while and has had various tests including MRIs done but she is not a very good historian. Most recently about a month  ago she had a abscess drained from her left foot and was in hospital for about 2 weeks and was then discharged with a PICC line and IV antibioticstics which she knows may be vancomycin and Levaquin. She has never had a wound VAC now has she had hyperbaric oxygen therapy. Past medical history significant for dyspnea, hypertension, hyperlipidemia, neuropathy, diabetes type  2 uncontrolled. She smokes cigarettes the latest blood reports are from March 2016 and show that her BMP is essentially normal her lipid panel was also normal her hemoglobin A1c was 9.4. 12/07/2014 -- Her tissue culture is back and she has grown significant amounts of Proteus mirabilis, pseudomonas aeruginosa and Staphylococcus aureus. The staph is a MSSA and all the cultures are sensitive to ciprofloxacin. I will start her on Cipro 500 mg by mouth twice a day for 14 days. Xray left foot -- IMPRESSION:No evidence of osteomyelitis. Healed fracture of the distal left third metatarsal. 12/21/2014 -- she is still awaiting clearance from her insurance company for HBOT. She was seen by Dr. Rosario Jacks was working on controlling her diabetes and she continues to work on giving up smoking. She has completed her dosage of ciprofloxacin and we will be continuing with local care. 01/03/2015 -- her insurance clearance is still not in and we are awaiting this before starting her on a hyperbaric oxygen therapy. 01/09/2015 - we are still awaiting her insurance clearance and other than that she is doing fine. 01/26/2015 -- she was unable to tolerate the total contact cast and hence had a Darco shoe back on her left Thelin, Jing M. ([id number]) foot. Her blood sugars have been doing better and her last hemoglobin A1c report is pending. He continues to smoke 6-8 cigarettes a day. 02/02/2015 -- yesterday her left leg started getting swollen and has red streaks starting at the foot and going up to her knee. She has had a mild fever and has not  gone to see her PCP or [facility] where she normally gets her [name] for this. 02/15/2015 - she has recently been discharged from [facility] after she was admitted for a cellulitis abscess and osteomyelitis of her left foot. We haven't received all her reports yet but as per the [name] she was on IV antibiotics for 10 days and then discharged home on Augmentin. Addendum -- was able to look at her records in [place]. Hospital Course: [name] [name] is a 39 y.o. female with PMH of HTN, DM, depression, and tobacco abuse who presented with left diabetic foot ulcer and cellulitis. Left leg cellulitis/Left diabetic foot ulcer with osteomyelitis: The [name] presented with worsening pain and green pus drainage from her diabetic foot ulcer and surrounding cellulitis. Most recently, she was hospitalized 4/12-4/19 for this foot wound. She was treated with Vancomycin and Zosyn. ID was consulted and recommended that the [name] receive Daptomycin, Ceftazidime, and Flagyl x 2 weeks. She had a PICC line placed prior to discharge. After discharge, she was transitioned to oral antibiotics. Wound cultures have grown GAS, GBS, MSSA, and MRSA.Marland Kitchen She notes that she was last treated with Ciprofloxacin for about 2 weeks and completed this course at least 2 weeks ago. She continues to follow with her wound clinic in [place] weekly. - Obtained MRI LLE which showed osteomyelitis of the left fourth metatarsal head. IMPRESSION: Findings compatible with acute osteomyelitis of the left fourth metatarsal head, with question of nondisplaced pathologic fracture. Plantar ulcer underlying the fourth MTP joint and diffuse soft tissue edema are noted. - Consulted Vascular Surgery, appreciate assistance. Performed left foot ulcer/bone debridement 8/15, sent bone for culture which showed no growth to date. - Started IV Vancomycin, Ceftaz, and Flagyl on admission. Transitioned to IV Unasyn 8/15. Given negative bone  cultures, will transition to oral Augmentin for 2 week course at discharge. - Provided wound care w/ wet to dry packing to  left foot wound with Kerlix wrap, change daily. - Per Vascular Surgery, WBAT LLE with offloading shoe. - Provided pain control with Oxycodone PRN, although pain had improved considerably prior to discharge. - Plan for follow up with Wound Clinic in [place] within 1 week after discharge. Arterial Duplex also done -- Final Interpretationo-- Left: Normal examination. No evidence of arterial occlusive disease.o LT Great toe pressure = 82 mmHg. No significant change asoocompared to previous study.o 02/23/2015 -- he has not Been able to see Dr. Ola Spurr and is running out of her Augmentin so we shall extend this. She had a stomach bug yesterday and could not make it to HBO. Continues to smoke but is working on quitting. 03/02/2015 -- she was seen by Dr. Ola Spurr yesterday and he has put on doxycycline and ciprofloxacin for 3 weeks. She continues with hyperbaric oxygen therapy and has been doing fine. 03/23/2015-- note from Dr. Ola Spurr reviewed dated 03/21/2015 -- he has put her on ciprofloxacin 1 tablet twice a day and doxycycline 1 tablet twice a day for 4 weeks. He has taken fresh cultures and if her ESR and CRP are increasing or she grows resistant organism he will plan to place a PICC line and start on IV Paragas, Darren M. ([id number]) antibiotics. He also recommends that if the infection does not heal she may need surgical resection of the metatarsal head and would consider this in about 4 weekso time. She continues to smoke however and I have again urged her to give this up completely. Her blood sugars under control. 04/05/2015 -- She has recently been reviewed by Dr. Ola Spurr and the culture of the foot showed MDR Escherichia coli and Grp Stret. This is resistant to Cipro and he has recommended a PICC line placement and starting on Ceftriaxone 2 g IV daily  for 6 weeks. He will check labs on a weekly basis. The [name] was also having a lot of problems with her ears and had an ENT checkup and after giving her local therapy she was able to tolerate hyperbaric oxygen again today. 04/20/2015 -- the [name] has tolerated 23 out of 30 treatments of hyperbaric oxygen therapy and continues to have her IV antibiotics. She is being keeping well otherwise and has had no significant problems. Electronic Signature(s) Signed: 04/20/2015 3:38:53 PM By: [name] Fudge MD, FACS Entered By: [name] on 04/20/2015 15:38:53 [name] [name], [name] [name] ([id number]) -------------------------------------------------------------------------------- Physical Exam Details [name] Name: [name], [name] 04/20/2015 3:15 Date of Service: PM Medical Record [id number] Number: [name] Account Number: [id number] Nov 08, 1975 (39 y.o. Treating RN: [name] Date of Birth/Sex: Female) Other Clinician: Primary Care Physician: [name] Treating [name] Referring Physician: Casilda [name] Physician/Extender: Weeks in [name]: 20 Constitutional . Pulse regular. Respirations normal and unlabored. Afebrile. . Eyes Nonicteric. Reactive to light. Ears, Nose, Mouth, and Throat Lips, teeth, and gums WNL.Marland Kitchen Moist mucosa without lesions . Neck supple and nontender. No palpable supraclavicular or cervical adenopathy. Normal sized without goiter. Respiratory WNL. No retractions.. Cardiovascular Pedal Pulses WNL. No clubbing, cyanosis or edema. Chest Breasts symmetical and no nipple discharge.. Breast tissue WNL, no masses, lumps, or tenderness.. Lymphatic No adneopathy. No adenopathy. No adenopathy. Musculoskeletal Adexa without tenderness or enlargement.. Digits and nails w/o clubbing, cyanosis, infection, petechiae, ischemia, or inflammatory conditions.. Integumentary (Hair, Skin) No suspicious lesions. No crepitus or fluctuance. No peri-wound warmth or  erythema. No masses.Marland Kitchen Psychiatric Judgement and insight Intact.. No evidence of depression, anxiety, or agitation.. Notes The dorsum of the foot has no evidence of  cellulitis or any redness. The plantar aspect the wound has significant depth down to bone and there is some callus and debris which need sharp debridement. After this is done there is adequate space to pack this wound and will continue local care. Electronic Signature(s) Signed: 04/20/2015 3:39:57 PM By: [name] Fudge MD, FACS Entered By: [name] on 04/20/2015 15:39:57 [name], [name] [name] -------------------------------------------------------------------------------- Physician Orders Details [name] Name: [name] [name], [name] 04/20/2015 3:15 Date of Service: PM Medical Record [id number] Number: [name] Account Number: [id number] Oct 06, 1975 (39 y.o. Treating RN: [name] Date of Birth/Sex: Female) Other Clinician: Primary Care Physician: [name] Treating [name] Referring Physician: Casilda [name] Physician/Extender: [name]: 20 Verbal / Phone Orders: Yes Clinician: Montey [name] Read Back and Verified: Yes Diagnosis Coding Wound Cleansing Wound #1 Left,Lateral,Plantar Foot o Clean wound with Normal Saline. Anesthetic Wound #1 Left,Lateral,Plantar Foot o Topical Lidocaine 4% cream applied to wound bed prior to debridement Primary Wound Dressing Wound #1 Left,Lateral,Plantar Foot o Other: - sorbact Secondary Dressing Wound #1 Left,Lateral,Plantar Foot o Gauze and Kerlix/Conform Dressing Change Frequency Wound #1 Left,Lateral,Plantar Foot o Change dressing every day. Follow-up Appointments Wound #1 Left,Lateral,Plantar Foot o Return Appointment in 1 week. Off-Loading Wound #1 Left,Lateral,Plantar Foot o Open toe surgical shoe to: Additional Orders / Instructions Wound #1 Left,Lateral,Plantar Foot o Stop Smoking o Increase protein  intake. [name], [name] [name] ([id number]) Hyperbaric Oxygen Therapy Wound #1 Left,Lateral,Plantar Foot o Evaluate for HBO Therapy o Indication: - Osteomyelitis; Abscess o 2.0 ATA for 90 Minutes without Air Breaks o One [name] per day (delivered Monday through Friday unless otherwise specified in Special Instructions below): o Total # of Treatments: - 30-40 o Follow Hyperbaric Oxygen Glycemia Protocol Medications-please add to medication list. Wound #1 Left,Lateral,Plantar Foot o P.O. Antibiotics - Doxycycline and Cipro prescribed by Dr. Ola Spurr o Other: - to start IV abx asap - PICC line in place right arm GLYCEMIA INTERVENTIONS PROTOCOL PRE-HBO [place] pre-HBO capillary blood 1 glucose (ensure physician order is in chart). A. Notify HBO physician and await physician orders. 2 If result is 70 mg/dl or below: B. If the result meets the hospital definition of a critical result, follow hospital policy. A. Give [name] an 8 ounce Glucerna Shake, an 8 ounce Ensure, or 8 ounces of a Glucerna/Ensure equivalent dietary supplement*. B. Wait 30 minutes. If result is 71 mg/dl to 130 mg/dl: C. Retest patientos capillary blood glucose (CBG). D. If result greater than or equal to 110 mg/dl, proceed with HBO. If result less than 110 mg/dl, notify HBO physician and consider holding HBO. If result is 131 mg/dl to 249 mg/dl: A. Proceed with HBO. A. Notify HBO physician and await physician orders. B. It is recommended to hold HBO and do blood/urine ketone If result is 250 mg/dl or greater: testing. C. If the result meets the hospital definition of a critical result, follow hospital policy. Bontrager, [name] [name] POST-HBO GLYCEMIA INTERVENTIONS ACTION INTERVENTION Obtain post HBO capillary blood 1 glucose (ensure physician order is in chart). A. Notify HBO physician and await physician orders. 2 If  result is 70 mg/dl or below: B. If the result meets the hospital definition of a critical result, follow hospital policy. A. Give [name] an 8 ounce Glucerna Shake, an 8 ounce Ensure, or 8 ounces of a Glucerna/Ensure equivalent dietary supplement*. B. Wait 15 minutes for symptoms of hypoglycemia (i.e. nervousness, anxiety, If result is 71 mg/dl to 100 mg/dl: sweating, chills, clamminess, irritability, confusion, tachycardia or  dizziness). C. If [name] asymptomatic, discharge [name]. If [name] symptomatic, repeat capillary blood glucose (CBG) and notify HBO physician. If result is 101 mg/dl to 249 mg/dl: A. Discharge [name]. A. Notify HBO physician and await physician orders. B. It is recommended to do If result is 250 mg/dl or greater: blood/urine ketone testing. C. If the result meets the hospital definition of a critical result, follow hospital policy. *Juice or candies are NOT equivalent products. If [name] refuses the Glucerna or Ensure, please consult the hospital dietitian for an appropriate substitute. Electronic Signature(s) Signed: 04/20/2015 4:17:13 PM By: [name] Fudge MD, FACS Signed: 04/20/2015 5:07:53 PM By: [name] Entered By: [name] on 04/20/2015 15:33:40 Chio, [name] [name] ([id number]) -------------------------------------------------------------------------------- Problem List Details [name] Name: [name], [name] [name] 04/20/2015 3:15 Date of Service: PM Medical Record [id number] Number: [name] Account Number: [id number] [birth year] (39 y.o. 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 ([id number]) -------------------------------------------------------------------------------- Progress Note Details [name] Name: [name] [name] 04/20/2015 3:15 Date of Service: PM Medical Record [id number] Number: [name] Account Number: [id number] [birth year] (39 y.o. Treating RN: [name] Date of Birth/Sex: Female) Other Clinician: Primary Care Physician: [name] Treating [name] Referring Physician: Casilda [name] Physician/Extender: [name]: 20 Subjective Chief Complaint Information obtained from [name] Patients presents for [name] of an open diabetic ulcer. The [name] has had an ulcerated wound on her left lower extremity for about 2 years. History of Present Illness (HPI) The following HPI elements were documented for the [name]'s wound: Location: left foot ulcer Quality: [name] reports No Pain. Severity: Wound is moderately severed with some exposed structure in the wound bed. Duration: [name] has had the wound for > 2 years prior to seeking [name] at the wound center Timing: for the last 2 years she has been treated at the [facility] wound care center Context: The wound appeared gradually over time Modifying Factors: Other [name](s) tried include:in the last  month she has been operated on the left foot for an abscess of that foot and was in hospital for 2 weeks. Associated Signs and Symptoms: Periwound excoriated and wound draining heavily 39 year old [name] who is known to be a diabetic comes from the office of Dr. Rosario Jacks for a ulcer on the left lower extremity and foot. She has had an ulcer on the left foot and has had previous [name] at [facility]. the [name] tells [place] that she has been seen as the outpatient center for a long while and has had various tests including MRIs done but she is not a very good historian. Most recently about a month ago she had a abscess drained from her left foot and was in hospital for about 2 weeks and was then discharged with a  PICC line and IV antibioticstics which she knows may be vancomycin and Levaquin. She has never had a wound VAC now has she had hyperbaric oxygen therapy. Past medical history significant for dyspnea, hypertension, hyperlipidemia, neuropathy, diabetes type 2 uncontrolled. She smokes cigarettes the latest blood reports are from March 2016 and show that her BMP is essentially normal her lipid panel was also normal her hemoglobin A1c was 9.4. 12/07/2014 -- Her tissue culture is back and she has grown significant amounts of Proteus mirabilis, pseudomonas aeruginosa and Staphylococcus aureus. The staph is a MSSA and all the cultures are sensitive to ciprofloxacin. I will start her on Cipro 500 mg by mouth twice a day for 14 days. Xray left foot -- IMPRESSION:No evidence of osteomyelitis. Healed fracture of the distal left Rendleman, [name] ([id number]) third metatarsal. 12/21/2014 -- she is still awaiting clearance from her insurance company for HBOT. She was seen by Dr. Rosario Jacks was working on controlling her diabetes and she continues to work on giving up smoking. She has completed her dosage of ciprofloxacin and we will be continuing with local care. 01/03/2015 -- her insurance clearance is  still not in and we are awaiting this before starting her on a hyperbaric oxygen therapy. 01/09/2015 - we are still awaiting her insurance clearance and other than that she is doing fine. 01/26/2015 -- she was unable to tolerate the total contact cast and hence had a Darco shoe back on her left foot. Her blood sugars have been doing better and her last hemoglobin A1c report is pending. He continues to smoke 6-8 cigarettes a day. 02/02/2015 -- yesterday her left leg started getting swollen and has red streaks starting at the foot and going up to her knee. She has had a mild fever and has not gone to see her PCP or [facility] where she normally gets her [name] for this. 02/15/2015 - she has recently been discharged from [facility] after she was admitted for a cellulitis abscess and osteomyelitis of her left foot. We haven't received all her reports yet but as per the [name] she was on IV antibiotics for 10 days and then discharged home on Augmentin. Addendum -- was able to look at her records in [place]. Hospital Course: GLYNDA SOLIDAY is a 39 y.o. female with PMH of HTN, DM, depression, and tobacco abuse who presented with left diabetic foot ulcer and cellulitis. Left leg cellulitis/Left diabetic foot ulcer with osteomyelitis: The [name] presented with worsening pain and green pus drainage from her diabetic foot ulcer and surrounding cellulitis. Most recently, she was hospitalized 4/12-4/19 for this foot wound. She was treated with Vancomycin and Zosyn. ID was consulted and recommended that the [name] receive Daptomycin, Ceftazidime, and Flagyl x 2 weeks. She had a PICC line placed prior to discharge. After discharge, she was transitioned to oral antibiotics. Wound cultures have grown GAS, GBS, MSSA, and MRSA.Marland Kitchen She notes that she was last treated with Ciprofloxacin for about 2 weeks and completed this course at least 2 weeks ago. She continues to follow with her wound clinic in  [place] weekly. - Obtained MRI LLE which showed osteomyelitis of the left fourth metatarsal head. IMPRESSION: Findings compatible with acute osteomyelitis of the left fourth metatarsal head, with question of nondisplaced pathologic fracture. Plantar ulcer underlying the fourth MTP joint and diffuse soft tissue edema are noted. - Consulted Vascular Surgery, appreciate assistance. Performed left foot ulcer/bone debridement 8/15, sent bone for culture which showed no growth to date. -  Started IV Vancomycin, Ceftaz, and Flagyl on admission. Transitioned to IV Unasyn 8/15. Given negative bone cultures, will transition to oral Augmentin for 2 week course at discharge. - Provided wound care w/ wet to dry packing to left foot wound with Kerlix wrap, change daily. - Per Vascular Surgery, WBAT LLE with offloading shoe. - Provided pain control with Oxycodone PRN, although pain had improved considerably prior to discharge. - Plan for follow up with Wound Clinic in [place] within 1 week after discharge. Arterial Duplex also done -- Final Interpretation -- Left: Normal examination. No evidence of arterial occlusive disease. LT Great toe pressure = 82 mmHg. No significant change as compared to previous study. [name] [name], [name] ([id number]) 02/23/2015 -- he has not Been able to see Dr. Ola Spurr and is running out of her Augmentin so we shall extend this. She had a stomach bug yesterday and could not make it to [facility]. Continues to smoke but is working on quitting. 03/02/2015 -- she was seen by Dr. Ola Spurr yesterday and he has put on doxycycline and ciprofloxacin for 3 weeks. She continues with hyperbaric oxygen therapy and has been doing fine. 03/23/2015-- note from Dr. Ola Spurr reviewed dated 03/21/2015 -- he has put her on ciprofloxacin 1 tablet twice a day and doxycycline 1 tablet twice a day for 4 weeks. He has taken fresh cultures and if her ESR and CRP are increasing or she grows resistant  organism he will plan to place a PICC line and start on IV antibiotics. He also recommends that if the infection does not heal she may need surgical resection of the metatarsal head and would consider this in about 4 weeks time. She continues to smoke however and I have again urged her to give this up completely. Her blood sugars under control. 04/05/2015 -- She has recently been reviewed by Dr. Ola Spurr and the culture of the foot showed MDR Escherichia coli and Grp Stret. This is resistant to Cipro and he has recommended a PICC line placement and starting on Ceftriaxone 2 g IV daily for 6 weeks. He will check labs on a weekly basis. The [name] was also having a lot of problems with her ears and had an ENT checkup and after giving her local therapy she was able to tolerate hyperbaric oxygen again today. 04/20/2015 -- the [name] has tolerated 23 out of 30 treatments of hyperbaric oxygen therapy and continues to have her IV antibiotics. She is being keeping well otherwise and has had no significant problems. Objective Constitutional Pulse regular. Respirations normal and unlabored. Afebrile. Vitals Time Taken: 3:13 PM, Height: 67 in, Weight: 290 lbs, BMI: 45.4, Temperature: 97.5 F, Pulse: 79 bpm, Respiratory Rate: 18 breaths/min, Blood Pressure: 139/71 mmHg, Capillary Blood Glucose: 214 mg/dl. Eyes Nonicteric. Reactive to light. Ears, Nose, Mouth, and Throat Lips, teeth, and gums WNL.Marland Kitchen Moist mucosa without lesions . Neck supple and nontender. No palpable supraclavicular or cervical adenopathy. Normal sized without goiter. Respiratory WNL. No retractions.. Cardiovascular Lichty, Rhealyn M. ([id number]) Pedal Pulses WNL. No clubbing, cyanosis or edema. Chest Breasts symmetical and no nipple discharge.. Breast tissue WNL, no masses, lumps, or tenderness.. Lymphatic No adneopathy. No adenopathy. No adenopathy. Musculoskeletal Adexa without tenderness or enlargement.. Digits and  nails w/o clubbing, cyanosis, infection, petechiae, ischemia, or inflammatory conditions.Marland Kitchen Psychiatric Judgement and insight Intact.. No evidence of depression, anxiety, or agitation.. General Notes: The dorsum of the foot has no evidence of cellulitis or any redness. The plantar aspect the wound has significant depth down to bone and  there is some callus and debris which need sharp debridement. After this is done there is adequate space to pack this wound and will continue local care. Integumentary (Hair, Skin) No suspicious lesions. No crepitus or fluctuance. No peri-wound warmth or erythema. No masses.. Wound #1 status is Open. Original cause of wound was Gradually Appeared. The wound is located on the [place]. The wound measures 0.7cm length x 0.4cm width x 1.3cm depth; 0.22cm^2 area and 0.286cm^3 volume. The wound is limited to skin breakdown. There is no tunneling or undermining noted. There is a medium amount of serous drainage noted. The wound margin is flat and intact. There is large (67-100%) pink granulation within the wound bed. There is no necrotic tissue within the wound bed. The periwound skin appearance exhibited: Callus, Maceration, Moist. The periwound skin appearance did not exhibit: Crepitus, Excoriation, Fluctuance, Friable, Induration, Localized Edema, Rash, Scarring, Dry/Scaly, Atrophie Blanche, Cyanosis, Ecchymosis, Hemosiderin Staining, Mottled, Pallor, Rubor, Erythema. 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 ([id number]) Procedures Wound #1 Wound #1 is a Diabetic Wound/Ulcer of the Lower Extremity located on the Left,Lateral,Plantar Foot . There was a Skin/Subcutaneous Tissue Debridement ([id number]) debridement with total area of 0.28 sq cm performed by [name] Fudge, MD. with the following instrument(s): Forceps and Scissors to remove Viable and Non-Viable tissue/material including Fibrin/Slough, Skin, Callus, and Subcutaneous after achieving pain control using Lidocaine 4% Topical Solution. A time out was conducted prior to the start of the procedure. A Minimum amount of bleeding was controlled with Pressure. The [name] tolerated the procedure with a pain level of 0 throughout and a pain level of 0 following the procedure. Post Debridement Measurements: 0.8cm length x 0.8cm width x 1.3cm depth; 0.653cm^3 volume. Post procedure Diagnosis Wound #1: Same as Pre-Procedure Plan Wound Cleansing: Wound #1 Left,Lateral,Plantar Foot: Clean wound with Normal Saline. Anesthetic: Wound #1 Left,Lateral,Plantar Foot: Topical Lidocaine 4% cream applied to wound bed prior to debridement Primary Wound Dressing: Wound #1 Left,Lateral,Plantar Foot: Other: - sorbact Secondary Dressing: Wound #1 Left,Lateral,Plantar Foot: Gauze and Kerlix/Conform Dressing Change Frequency: Wound #1 Left,Lateral,Plantar Foot: Change dressing every day. Follow-up Appointments: Wound #1 Left,Lateral,Plantar Foot: Return Appointment in 1 week. Off-Loading: Wound #1 Left,Lateral,Plantar Foot: Open toe surgical shoe to: Additional Orders / Instructions: Wound #1 Left,Lateral,Plantar Foot: Stop Smoking Increase protein intake. Hyperbaric Oxygen Therapy: Wound #1 Left,Lateral,Plantar Foot: Fuhr, [name] [name] ([id number]) Evaluate for HBO Therapy Indication: - Osteomyelitis; Abscess 2.0 ATA for 90 Minutes without Air Breaks One [name] per day (delivered  Monday through Friday unless otherwise specified in Special Instructions below): Total # of Treatments: - 30-40 Follow Hyperbaric Oxygen Glycemia Protocol Medications-please add to medication list.: Wound #1 Left,Lateral,Plantar Foot: P.O. Antibiotics - Doxycycline and Cipro prescribed by Dr. 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 We will continue packing with strips of Sorbact and daily dressings. She is also receive hyperbaric oxygen therapy and  she is getting IV antibiotics as per infectious disease. The debridement, IV antibiotics and hyperbaric oxygen therapy is her best option to try and save this foot. So far, the [name] has completed 23 out of 30 treatments of hyperbaric oxygen therapy. I believe she will benefit greatly from a total of 60 treatments and I am going to recommend continuing her hyperbaric oxygen therapy 5 days a week with the usual protocol for osteomyelitis. We will apply for authorization from her insurance company and continue to treat her aggressively service to give her the best chance of healing this wound and saving her foot. Electronic Signature(s) Signed: 04/20/2015 3:44:21 PM By: [name] Fudge MD, FACS Entered By: [name] on 04/20/2015 15:44:20 Dunsmore, [name] [name] ([id number]) -------------------------------------------------------------------------------- SuperBill Details [name] Name: Constancio, Divina M. Date of Service: 04/20/2015 Medical Record Number: [id number] [name] Account Number: [id number] Date of Birth/Sex: 08/25/1975 (39 y.o. Female) Treating RN: [name] Primary Care Physician: [name] Other Clinician: Referring Physician: Casilda [name] Treating Physician/Extender: Frann Rider in [name]:  20 Diagnosis Coding ICD-10 Codes Code Description E11.621 Type 2 diabetes mellitus with foot ulcer E08.40 Diabetes mellitus due to underlying condition with diabetic neuropathy, unspecified L97.513 Non-pressure chronic ulcer of other part of right foot with necrosis of muscle F17.218 Nicotine dependence, cigarettes, with other nicotine-induced disorders E66.01 Morbid (severe) obesity due to excess calories L03.116 Cellulitis of left lower limb 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, left ankle and foot Facility Procedures CPT4: Description Modifier Quantity Code 26948546 11042 - DEB SUBQ TISSUE 20 SQ CM/< 1 ICD-10 Description Diagnosis E11.621 Type 2 diabetes mellitus with foot ulcer E08.40 Diabetes mellitus due to underlying condition with diabetic neuropathy,  unspecified L97.513 Non-pressure chronic ulcer of other part of right foot with necrosis of muscle 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, left ankle and foot Physician Procedures CPT4: Description Modifier Quantity Code [id number] - WC PHYS SUBQ TISS 20 SQ CM 1 ICD-10 Description Diagnosis E11.621 Type 2 diabetes mellitus with foot ulcer E08.40 Diabetes mellitus due to underlying condition with diabetic neuropathy, unspecified  L97.513 Non-pressure chronic ulcer of other part of right foot with necrosis of muscle 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, left ankle and foot Tsang, [name] [name] ([id number]) Electronic Signature(s) Signed: 04/20/2015 3:44:34 PM By: [name] Fudge MD, FACS Entered By: [name] on 04/20/2015 15:44:34

## 2015-04-21 NOTE — Progress Notes (Addendum)
[name], [name] ([id number]) Visit Report for 04/20/2015 Arrival Information Details Patient Name: [name], [name] Date of Service: 04/20/2015 3:15 PM Medical Record Number: [id number] Patient Account Number: [id number] Date of Birth/Sex: 04/21/1976 (39 y.o. Female) Treating RN: Curtis Sitesorthy, Joanna Primary Care Physician: Sherrie MustacheJADALI, FAYEGH Other Clinician: Referring Physician: Sherrie MustacheJADALI, FAYEGH Treating Physician/Extender: Rudene ReBritto, Errol Weeks in Treatment: 20 Visit Information History Since Last Visit Added or deleted any medications: No Patient Arrived: Ambulatory Any new allergies or adverse reactions: No Arrival Time: 15:14 Had a fall or experienced change in No Accompanied By: self activities of daily living that may affect Transfer Assistance: None risk of falls: Patient Identification Verified: Yes Signs or symptoms of abuse/neglect since last No Secondary Verification Process Yes visito Completed: Hospitalized since last visit: No Patient Has Alerts: Yes Pain Present Now: No Electronic Signature(s) Signed: 04/20/2015 5:07:53 PM By: Curtis Sitesorthy, Joanna Entered By: Curtis Sitesorthy, Joanna on 04/20/2015 15:14:51 Crisafulli, [name] ([id number]) -------------------------------------------------------------------------------- Encounter Discharge Information Details Patient Name: [name], [name] Date of Service: 04/20/2015 3:15 PM Medical Record Number: [id number] Patient Account Number: [id number] Date of Birth/Sex: 04/21/1976 (39 y.o. Female) Treating RN: Curtis Sitesorthy, Joanna Primary Care Physician: Sherrie MustacheJADALI, FAYEGH Other Clinician: Referring Physician: Sherrie MustacheJADALI, FAYEGH Treating Physician/Extender: Rudene ReBritto, Errol Weeks in Treatment: 20 Encounter Discharge Information Items Discharge Pain Level: 0 Discharge Condition: Stable Ambulatory Status: Ambulatory Discharge Destination: Home Private Transportation: Auto Accompanied By: self Schedule Follow-up Appointment: Yes Medication  Reconciliation completed and No provided to Patient/Care Aayansh Codispoti: Clinical Summary of Care: Electronic Signature(s) Signed: 04/20/2015 4:58:55 PM By: Curtis Sitesorthy, Joanna Entered By: Curtis Sitesorthy, Joanna on 04/20/2015 16:58:55 Pyle, [name] ([id number]) -------------------------------------------------------------------------------- Lower Extremity Assessment Details Patient Name: [name], [name] Date of Service: 04/20/2015 3:15 PM Medical Record Number: [id number] Patient Account Number: [id number] Date of Birth/Sex: 04/21/1976 (39 y.o. Female) Treating RN: Curtis Sitesorthy, Joanna Primary Care Physician: Sherrie MustacheJADALI, FAYEGH Other Clinician: Referring Physician: Sherrie MustacheJADALI, FAYEGH Treating Physician/Extender: Rudene ReBritto, Errol Weeks in Treatment: 20 Vascular Assessment Pulses: Posterior Tibial Dorsalis Pedis Palpable: [Left:Yes] Extremity colors, hair growth, and conditions: Extremity Color: [Left:Normal] Hair Growth on Extremity: [Left:Yes] Temperature of Extremity: [Left:Warm] Capillary Refill: [Left:< 3 seconds] Toe Nail Assessment Left: Right: Thick: Yes Discolored: Yes Deformed: Yes Improper Length and Hygiene: Yes Electronic Signature(s) Signed: 04/20/2015 5:07:53 PM By: Curtis Sitesorthy, Joanna Entered By: Curtis Sitesorthy, Joanna on 04/20/2015 15:17:42 Ulatowski, [name] ([id number]) -------------------------------------------------------------------------------- Multi Wound Chart Details Patient Name: [name], [name] Date of Service: 04/20/2015 3:15 PM Medical Record Number: [id number] Patient Account Number: [id number] Date of Birth/Sex: 04/21/1976 (39 y.o. Female) Treating RN: Curtis Sitesorthy, Joanna Primary Care Physician: Sherrie MustacheJADALI, FAYEGH Other Clinician: Referring Physician: Sherrie MustacheJADALI, FAYEGH Treating Physician/Extender: Rudene ReBritto, Errol Weeks in Treatment: 20 Photos: [1:No Photos] [N/A:N/A] Wound Location: [1:Left Foot - Plantar, Lateral N/A] Wounding Event: [1:Gradually Appeared] [N/A:N/A] Primary  Etiology: [1:Diabetic Wound/Ulcer of N/A the Lower Extremity] Comorbid History: [1:Type II Diabetes] [N/A:N/A] Date Acquired: [1:11/21/2013] [N/A:N/A] Weeks of Treatment: [1:20] [N/A:N/A] Wound Status: [1:Open] [N/A:N/A] Measurements L x W x D 0.7x0.4x1.3 [N/A:N/A] (cm) Area (cm) : [1:0.22] [N/A:N/A] Volume (cm) : [1:0.286] [N/A:N/A] % Reduction in Area: [1:90.20%] [N/A:N/A] % Reduction in Volume: 85.90% [N/A:N/A] Classification: [1:Grade 3] [N/A:N/A] Wagner Verification: [1:Abscess] [N/A:N/A] Exudate Amount: [1:Medium] [N/A:N/A] Exudate Type: [1:Serous] [N/A:N/A] Exudate Color: [1:amber] [N/A:N/A] Wound Margin: [1:Flat and Intact] [N/A:N/A] Granulation Amount: [1:Large (67-100%)] [N/A:N/A] Granulation Quality: [1:Pink] [N/A:N/A] Necrotic Amount: [1:None Present (0%)] [N/A:N/A] Exposed Structures: [1:Fascia: No Fat: No Tendon: No Muscle: No Joint: No Bone: No Limited to Skin Breakdown] [N/A:N/A] Epithelialization: [1:None] [N/A:N/A] Periwound Skin Texture: Callus: Yes [1:Edema: No Excoriation: No  Induration: No Crepitus: No] [N/A:N/A] Fluctuance: No Friable: No Rash: No Scarring: No Periwound Skin Maceration: Yes N/A N/A Moisture: Moist: Yes Dry/Scaly: No Periwound Skin Color: Atrophie Blanche: No N/A N/A Cyanosis: No Ecchymosis: No Erythema: No Hemosiderin Staining: No Mottled: No Pallor: No Rubor: No Tenderness on No N/A N/A Palpation: Wound Preparation: Ulcer Cleansing: N/A N/A Rinsed/Irrigated with Saline Topical Anesthetic Applied: Other: lidocaine 4% Treatment Notes Electronic Signature(s) Signed: 04/20/2015 5:07:53 PM By: Curtis Sites Entered By: Curtis Sites on 04/20/2015 15:20:07 Furber, [name] ([id number]) -------------------------------------------------------------------------------- Multi-Disciplinary Care Plan Details Patient Name: Mcnulty, Ellean M. Date of Service: 04/20/2015 3:15 PM Medical Record Number: [id number] Patient  Account Number: [id number] Date of Birth/Sex: 03/30/1976 (39 y.o. Female) Treating RN: Curtis Sites Primary Care Physician: [name] Other Clinician: Referring Physician: [name] Treating Physician/Extender: [name] in Treatment: 20 Active Inactive Electronic Signature(s) Signed: 06/14/2015 6:22:50 PM By: Elliot Gurney RN, BSN, Kim RN, BSN Signed: 07/05/2015 5:07:02 PM By: Curtis Sites Previous Signature: 04/20/2015 5:07:53 PM Version By: Curtis Sites Entered By: Elliot Gurney RN, BSN, Kim on 05/30/2015 16:57:06 Zalar, [name] ([id number]) -------------------------------------------------------------------------------- Patient/Caregiver Education Details Patient Name: Ohanesian, [name] Date of Service: 04/20/2015 3:15 PM Medical Record Number: [id number] Patient Account Number: [id number] Date of Birth/Gender: 1975-09-21 (39 y.o. Female) Treating RN: Curtis Sites Primary Care Physician: [name] Other Clinician: Referring Physician: [name] Treating Physician/Extender: [name] in Treatment: 20 Education Assessment Education Provided To: Patient Education Topics Provided Wound/Skin Impairment: Handouts: Other: wound care as ordered Methods: Demonstration, Explain/Verbal Responses: State content correctly Electronic Signature(s) Signed: 04/20/2015 4:59:09 PM By: Curtis Sites Entered By: Curtis Sites on 04/20/2015 16:59:09 Mancinas, [name] ([id number]) -------------------------------------------------------------------------------- Wound Assessment Details Patient Name: Rotter, Phila M. Date of Service: 04/20/2015 3:15 PM Medical Record Number: [id number] Patient Account Number: [id number] Date of Birth/Sex: 12-15-1975 (39 y.o. Female) Treating RN: Curtis Sites Primary Care Physician: [name] Other Clinician: Referring Physician: [name] Treating Physician/Extender: [name] in  Treatment: 20 Wound Status Wound Number: 1 Primary Diabetic Wound/Ulcer of the Lower Etiology: Extremity Wound Location: Left Foot - Plantar, Lateral Wound Status: Open Wounding Event: Gradually Appeared Comorbid Type II Diabetes Date Acquired: 11/21/2013 History: Weeks Of Treatment: 20 Clustered Wound: No Photos Photo Uploaded By: Curtis Sites on 04/20/2015 17:01:20 Wound Measurements Length: (cm) 0.7 Width: (cm) 0.4 Depth: (cm) 1.3 Area: (cm) 0.22 Volume: (cm) 0.286 % Reduction in Area: 90.2% % Reduction in Volume: 85.9% Epithelialization: None Tunneling: No Undermining: No Wound Description Classification: Grade 3 Wagner Verification: Abscess Wound Margin: Flat and Intact Exudate Amount: Medium Exudate Type: Serous Exudate Color: amber Foul Odor After Cleansing: No Wound Bed Granulation Amount: Large (67-100%) Exposed Structure Granulation Quality: Pink Fascia Exposed: No Necrotic Amount: None Present (0%) Fat Layer Exposed: No Mortell, Mariko M. ([id number]) Tendon Exposed: No Muscle Exposed: No Joint Exposed: No Bone Exposed: No Limited to Skin Breakdown Periwound Skin Texture Texture Color No Abnormalities Noted: No No Abnormalities Noted: No Callus: Yes Atrophie Blanche: No Crepitus: No Cyanosis: No Excoriation: No Ecchymosis: No Fluctuance: No Erythema: No Friable: No Hemosiderin Staining: No Induration: No Mottled: No Localized Edema: No Pallor: No Rash: No Rubor: No Scarring: No Moisture No Abnormalities Noted: No Dry / Scaly: No Maceration: Yes Moist: Yes Wound Preparation Ulcer Cleansing: Rinsed/Irrigated with Saline Topical Anesthetic Applied: Other: lidocaine 4%, Electronic Signature(s) Signed: 04/20/2015 5:07:53 PM By: Curtis Sites Entered By: Curtis Sites on 04/20/2015 15:19:51 Meldrum, [name] ([id number]) -------------------------------------------------------------------------------- Vitals Details Patient  Name: Majkowski, Dajai M. Date  of Service: 04/20/2015 3:15 PM Medical Record Number: [id number] Patient Account Number: [id number] Date of Birth/Sex: June 02, 1976 (39 y.o. Female) Treating RN: Curtis Sites Primary Care Physician: [name] Other Clinician: Referring Physician: [name] Treating Physician/Extender: [name] in Treatment: 20 Vital Signs Time Taken: 15:13 Temperature (F): 97.5 Height (in): 67 Pulse (bpm): 79 Weight (lbs): 290 Respiratory Rate (breaths/min): 18 Body Mass Index (BMI): 45.4 Blood Pressure (mmHg): 139/71 Capillary Blood Glucose (mg/dl): 409 Reference Range: 80 - 120 mg / dl Electronic Signature(s) Signed: 04/20/2015 3:21:04 PM By: Dayton Martes RCP, RRT, CHT Entered By: Dayton Martes on 04/20/2015 15:20:47

## 2015-04-21 NOTE — Progress Notes (Signed)
[name], [name] ([id number]) Visit Report for 04/20/2015 HBO Details Patient Name: [name], [name] 04/20/2015 1:00 Date of Service: PM Medical Record [id number] Number: Patient Account Number: [id number] 03/31/1976 (39 y.o. Treating RN: Date of Birth/Sex: Female) Other Clinician: Izetta DakinWallace, Sallie Primary Care Physician: Sherrie MustacheJADALI, FAYEGH Treating Britto, Errol Referring Physician: Sherrie MustacheJADALI, FAYEGH Physician/Extender: Tania AdeWeeks in Treatment: 20 HBO Treatment Course Details Treatment Course Ordering Physician: Evlyn KannerBritto, Errol 1 Number: HBO Treatment Start Date: 02/19/2015 Total Treatments 30 Ordered: HBO Indication: Diabetic Ulcer(s) of the Lower Extremity HBO Treatment Details Treatment Number: 23 Patient Type: Outpatient Chamber Type: Monoplace Chamber #: [id number] Treatment Protocol: 2.0 ATA with 90 minutes oxygen, and no air breaks Treatment Details Compression Rate Down: 1.0 psi / minute De-Compression Rate Up: 1.5 psi / minute Air breaks and breathing Compress Tx Pressure Decompress Decompress periods Begins Reached Begins Ends (leave unused spaces blank) Chamber Pressure 1 ATA 2.0 ATA - - - - - - 2.0 ATA 1 ATA Clock Time (24 hr) 13:00 13:20 - - - - - - 14:50 15:05 Treatment Length: 125 (minutes) Treatment Segments: 4 Capillary Blood Glucose Pre Capillary Blood Glucose (mg/dl): Post Capillary Blood Glucose (mg/dl): Vital Signs Capillary Blood Glucose Reference Range: 80 - 120 mg / dl HBO Diabetic Blood Glucose Intervention Range: <131 mg/dl or >865>249 mg/dl Time Vitals Blood Respiratory Capillary Blood Glucose Pulse Action Type: Pulse: Temperature: Taken: Pressure: Rate: Glucose (mg/dl): Meter #: Oximetry (%) Taken: Pre 12:52 138/92 84 18 97.6 292 1 none Post 15:13 139/71 79 18 97.5 214 1 none Treatment Response [name], [name] ([id number]) Treatment Completion Status: Treatment Completed without Adverse Event Treatment Notes Patient has a cold and  is taking sinus medication but still had problems with "sinus squeeze" today. Pressure was increased gradually and the rate was decreased from 1.5 psi/min to 1.0 psi/min. Patient was finally able to reach prescribed pressure of 2.0 ATA after 20 minutes. HBO Attestation I certify that I supervised this HBO treatment in accordance with Medicare guidelines. A trained Yes emergency response team is readily available per hospital policies and procedures. Continue HBOT as ordered. Yes Electronic Signature(s) Signed: 04/20/2015 3:45:16 PM By: Evlyn KannerBritto, Errol MD, FACS Previous Signature: 04/20/2015 3:21:04 PM Version By: Dayton MartesWallace, RCP,RRT,CHT, Sallie RCP, RRT, CHT Entered By: Evlyn KannerBritto, Errol on 04/20/2015 15:45:15 [name], [name] ([id number]) -------------------------------------------------------------------------------- HBO Safety Checklist Details Patient Name: [name], [name] 04/20/2015 1:00 Date of Service: PM Medical Record [id number] Number: Patient Account Number: [id number] 03/31/1976 (39 y.o. Treating RN: Date of Birth/Sex: Female) Other Clinician: Izetta DakinWallace, Sallie Primary Care Physician: Sherrie MustacheJADALI, FAYEGH Treating Britto, Errol Referring Physician: Sherrie MustacheJADALI, FAYEGH Physician/Extender: Tania AdeWeeks in Treatment: 20 HBO Safety Checklist Items Safety Checklist Consent Form Signed Patient voided / foley secured and emptied When did you last eato 09:30 am Last dose of injectable or oral agent 08:00 am NA Ostomy pouch emptied and vented if applicable NA All implantable devices assessed, documented and approved Intravenous access site secured and place Valuables secured Linens and cotton and cotton/polyester blend (less than 51% polyester) Personal oil-based products / skin lotions / body lotions removed Wigs or hairpieces removed Smoking or tobacco materials removed Books / newspapers / magazines / loose paper removed Cologne, aftershave, perfume and deodorant removed Jewelry  removed (may wrap wedding band) Make-up removed Hair care products removed Battery operated devices (external) removed Heating patches and chemical warmers removed NA Titanium eyewear removed Nail polish cured greater than 10 hours NA Casting material cured greater than 10 hours NA Hearing aids removed NA Loose dentures or  partials removed NA Prosthetics have been removed Patient demonstrates correct use of air break device (if applicable) Patient concerns have been addressed Patient grounding bracelet on and cord attached to chamber Specifics for Inpatients (complete in addition to above) Medication sheet sent with patient [name], [name] WOLLAM. ([id number]) Intravenous medications needed or due during therapy sent with patient Drainage tubes (e.g. nasogastric tube or chest tube secured and vented) Endotracheal or Tracheotomy tube secured Cuff deflated of air and inflated with saline Airway suctioned Electronic Signature(s) Signed: 04/20/2015 3:21:04 PM By: Dayton Martes RCP, RRT, CHT Entered By: Weyman Rodney, Lucio Edward on 04/20/2015 13:06:41

## 2015-04-23 ENCOUNTER — Encounter: Payer: Medicaid Other | Admitting: Surgery

## 2015-04-23 DIAGNOSIS — L97523 Non-pressure chronic ulcer of other part of left foot with necrosis of muscle: Secondary | ICD-10-CM | POA: Diagnosis not present

## 2015-04-23 LAB — GLUCOSE, CAPILLARY
Glucose-Capillary: 262 mg/dL — ABNORMAL HIGH (ref 65–99)
Glucose-Capillary: 236 mg/dL — ABNORMAL HIGH (ref 65–99)

## 2015-04-23 NOTE — Progress Notes (Signed)
[name], Colette M. ([id number]) Visit Report for 04/23/2015 Arrival Information Details Patient Name: [name], [name] 04/23/2015 1:00 Date of Service: PM Medical Record [id number] Number: Patient Account Number: [id number] March 13, 1976 (39 y.o. Treating RN: Date of Birth/Sex: Female) Other Clinician: Izetta DakinWallace, Sallie Primary Care Physician: Sherrie MustacheJADALI, FAYEGH Treating Britto, Errol Referring Physician: Sherrie MustacheJADALI, FAYEGH Physician/Extender: Tania AdeWeeks in Treatment: 20 Visit Information History Since Last Visit Added or deleted any medications: No Patient Arrived: Ambulatory Any new allergies or adverse reactions: No Arrival Time: 12:50 Had a fall or experienced change in No Accompanied By: self activities of daily living that may affect Transfer Assistance: None risk of falls: Patient Identification Verified: Yes Signs or symptoms of abuse/neglect No Secondary Verification Process Yes since last visito Completed: Hospitalized since last visit: No Patient Has Alerts: Yes Has Dressing in Place as Prescribed: Yes Has Footwear/Offloading in Place as Yes Prescribed: Right: Surgical Shoe with Pressure Relief Insole Pain Present Now: No Electronic Signature(s) Signed: 04/23/2015 4:02:36 PM By: Dayton MartesWallace, RCP,RRT,CHT, Sallie RCP, RRT, CHT Entered By: Dayton MartesWallace, RCP,RRT,CHT, Sallie on 04/23/2015 13:01:31 Meeker, [name] ([id number]) -------------------------------------------------------------------------------- Encounter Discharge Information Details Patient Name: [name], [name] 04/23/2015 1:00 Date of Service: PM Medical Record [id number] Number: Patient Account Number: [id number] March 13, 1976 (39 y.o. Treating RN: Date of Birth/Sex: Female) Other Clinician: Izetta DakinWallace, Sallie Primary Care Physician: Sherrie MustacheJADALI, FAYEGH Treating Evlyn KannerBritto, Errol Referring Physician: Sherrie MustacheJADALI, FAYEGH Physician/Extender: Tania AdeWeeks in Treatment: 20 Encounter Discharge Information Items Discharge Pain Level:  0 Discharge Condition: Stable Ambulatory Status: Ambulatory Discharge Destination: Home Private Transportation: Auto Accompanied By: self Schedule Follow-up Appointment: No Medication Reconciliation completed and No provided to Patient/Care Grayson Pfefferle: Clinical Summary of Care: Notes Patient has an HBO treatment scheduled on 04/24/15 at 13:00 pm. Electronic Signature(s) Signed: 04/23/2015 4:02:36 PM By: Dayton MartesWallace, RCP,RRT,CHT, Sallie RCP, RRT, CHT Entered By: Dayton MartesWallace, RCP,RRT,CHT, Sallie on 04/23/2015 15:41:29 Sonn, [name] ([id number]) -------------------------------------------------------------------------------- Vitals Details Patient Name: [name], [name] 04/23/2015 1:00 Date of Service: PM Medical Record [id number] Number: Patient Account Number: [id number] March 13, 1976 (39 y.o. Treating RN: Date of Birth/Sex: Female) Other Clinician: Izetta DakinWallace, Sallie Primary Care Physician: Sherrie MustacheJADALI, FAYEGH Treating Britto, Errol Referring Physician: Sherrie MustacheJADALI, FAYEGH Physician/Extender: Tania AdeWeeks in Treatment: 20 Vital Signs Time Taken: 12:50 Temperature (F): 98.1 Height (in): 67 Pulse (bpm): 72 Weight (lbs): 290 Respiratory Rate (breaths/min): 18 Body Mass Index (BMI): 45.4 Blood Pressure (mmHg): 132/90 Capillary Blood Glucose (mg/dl): 425262 Reference Range: 80 - 120 mg / dl Electronic Signature(s) Signed: 04/23/2015 4:02:36 PM By: Dayton MartesWallace, RCP,RRT,CHT, Sallie RCP, RRT, CHT Entered By: Dayton MartesWallace, RCP,RRT,CHT, Sallie on 04/23/2015 13:02:01

## 2015-04-23 NOTE — Progress Notes (Signed)
RENNEE, COYNE ([id number]) Visit Report for 04/23/2015 HBO Details Patient Name: [name], [name] 04/23/2015 1:00 Date of Service: PM Medical Record [id number] Number: Patient Account Number: [id number] 1976/05/17 (39 y.o. Treating RN: Date of Birth/Sex: Female) Other Clinician: [name] Primary Care Physician: [name] Treating Britto, Errol Referring Physician: [name] Physician/Extender: [name] in Treatment: 20 HBO Treatment Course Details Treatment Course Ordering Physician: [name] 1 Number: HBO Treatment Start Date: 02/19/2015 Total Treatments 30 Ordered: HBO Indication: Diabetic Ulcer(s) of the Lower Extremity HBO Treatment Details Treatment Number: 24 Patient Type: Outpatient Chamber Type: Monoplace Chamber #: [id number] Treatment Protocol: 2.0 ATA with 90 minutes oxygen, and no air breaks Treatment Details Compression Rate Down: 1.0 psi / minute De-Compression Rate Up: 1.5 psi / minute Air breaks and breathing Compress Tx Pressure Decompress Decompress periods Begins Reached Begins Ends (leave unused spaces blank) Chamber Pressure 1 ATA 2.0 ATA - - - - - - 2.0 ATA 1 ATA Clock Time (24 hr) 13:00 13:15 - - - - - - 14:46 14:56 Treatment Length: 116 (minutes) Treatment Segments: 4 Capillary Blood Glucose Pre Capillary Blood Glucose (mg/dl): Post Capillary Blood Glucose (mg/dl): Vital Signs Capillary Blood Glucose Reference Range: 80 - 120 mg / dl HBO Diabetic Blood Glucose Intervention Range: <131 mg/dl or >841 mg/dl Time Vitals Blood Respiratory Capillary Blood Glucose Pulse Action Type: Pulse: Temperature: Taken: Pressure: Rate: Glucose (mg/dl): Meter #: Oximetry (%) Taken: Pre 12:50 132/90 72 18 98.1 262 1 none Post 15:00 162/98 78 18 97.7 236 1 none Treatment Response FRANCI, OSHANA ([id number]) Treatment Completion Status: Treatment Completed without Adverse Event Electronic Signature(s) Signed:  04/23/2015 4:02:36 PM By: Dayton Martes RCP, RRT, CHT Signed: 04/23/2015 4:25:19 PM By: [name] MD, FACS Previous Signature: 04/23/2015 3:25:19 PM Version By: [name] MD, FACS Entered By: Dayton Martes on 04/23/2015 15:40:51 [place], [name] ([id number]) -------------------------------------------------------------------------------- HBO Safety Checklist Details Patient Name: CINDA, HARA. 04/23/2015 1:00 Date of Service: PM Medical Record [id number] Number: Patient Account Number: [id number] Oct 26, 1975 (39 y.o. Treating RN: Date of Birth/Sex: Female) Other Clinician: [name] Primary Care Physician: [name] Treating Britto, Errol Referring Physician: [name] Physician/Extender: [name] in Treatment: 20 HBO Safety Checklist Items Safety Checklist Consent Form Signed Patient voided / foley secured and emptied When did you last eato 21:00 pm on 04/22/15 Last dose of injectable or oral agent 08:00 am NA Ostomy pouch emptied and vented if applicable NA All implantable devices assessed, documented and approved Intravenous access site secured and place Valuables secured Linens and cotton and cotton/polyester blend (less than 51% polyester) Personal oil-based products / skin lotions / body lotions removed Wigs or hairpieces removed Smoking or tobacco materials removed Books / newspapers / magazines / loose paper removed Cologne, aftershave, perfume and deodorant removed Jewelry removed (may wrap wedding band) Make-up removed Hair care products removed Battery operated devices (external) removed Heating patches and chemical warmers removed NA Titanium eyewear removed Nail polish cured greater than 10 hours NA Casting material cured greater than 10 hours NA Hearing aids removed NA Loose dentures or partials removed NA Prosthetics have been removed Patient demonstrates correct use of air break device (if  applicable) Patient concerns have been addressed Patient grounding bracelet on and cord attached to chamber Specifics for Inpatients (complete in addition to above) Medication sheet sent with patient RYANA, MONTECALVO. ([id number]) Intravenous medications needed or due during therapy sent with patient Drainage tubes (e.g. nasogastric tube or chest tube secured and vented) Endotracheal  or Tracheotomy tube secured Cuff deflated of air and inflated with saline Airway suctioned Electronic Signature(s) Signed: 04/23/2015 4:02:36 PM By: Dayton MartesWallace, RCP,RRT,CHT, Sallie RCP, RRT, CHT Entered By: Dayton MartesWallace, RCP,RRT,CHT, Sallie on 04/23/2015 13:03:06

## 2015-04-24 ENCOUNTER — Encounter: Payer: Medicaid Other | Admitting: Surgery

## 2015-04-25 ENCOUNTER — Encounter: Payer: Medicaid Other | Admitting: Surgery

## 2015-04-26 ENCOUNTER — Ambulatory Visit: Payer: Medicaid Other | Admitting: Surgery

## 2015-04-26 ENCOUNTER — Encounter: Payer: Medicaid Other | Admitting: Surgery

## 2015-04-27 ENCOUNTER — Encounter: Payer: Medicaid Other | Admitting: Surgery

## 2015-04-30 ENCOUNTER — Encounter: Payer: Medicaid Other | Admitting: Surgery

## 2015-05-01 ENCOUNTER — Encounter: Payer: Medicaid Other | Admitting: Surgery

## 2015-05-02 ENCOUNTER — Encounter: Payer: Medicaid Other | Admitting: Surgery

## 2015-05-03 ENCOUNTER — Encounter: Payer: Medicaid Other | Admitting: Surgery

## 2015-05-03 ENCOUNTER — Ambulatory Visit: Payer: Medicaid Other | Admitting: Surgery

## 2015-05-04 ENCOUNTER — Encounter: Payer: Medicaid Other | Admitting: Surgery

## 2015-05-07 ENCOUNTER — Encounter: Payer: Medicaid Other | Admitting: Surgery

## 2015-05-08 ENCOUNTER — Encounter: Payer: Medicaid Other | Admitting: Surgery

## 2015-05-09 ENCOUNTER — Encounter: Payer: Medicaid Other | Admitting: Surgery

## 2015-05-10 ENCOUNTER — Ambulatory Visit: Payer: Medicaid Other | Admitting: Surgery

## 2015-05-10 ENCOUNTER — Encounter: Payer: Medicaid Other | Admitting: Surgery

## 2015-05-11 ENCOUNTER — Encounter: Payer: Medicaid Other | Admitting: Surgery

## 2016-07-25 IMAGING — CR DG FOOT COMPLETE 3+V*L*
1 series · 3 of 3 positions shown · non-contrast
Comparison: Left foot films of 10/31/2012

CLINICAL DATA: Nonhealing wound on the bottom of the foot for 1
year, the patient is diabetic

EXAM:
LEFT FOOT - COMPLETE 3+ VIEW

[Series 1: dg foot complete left · 0.14mm/px · 3 of 3 slices shown]
[im 1/3]
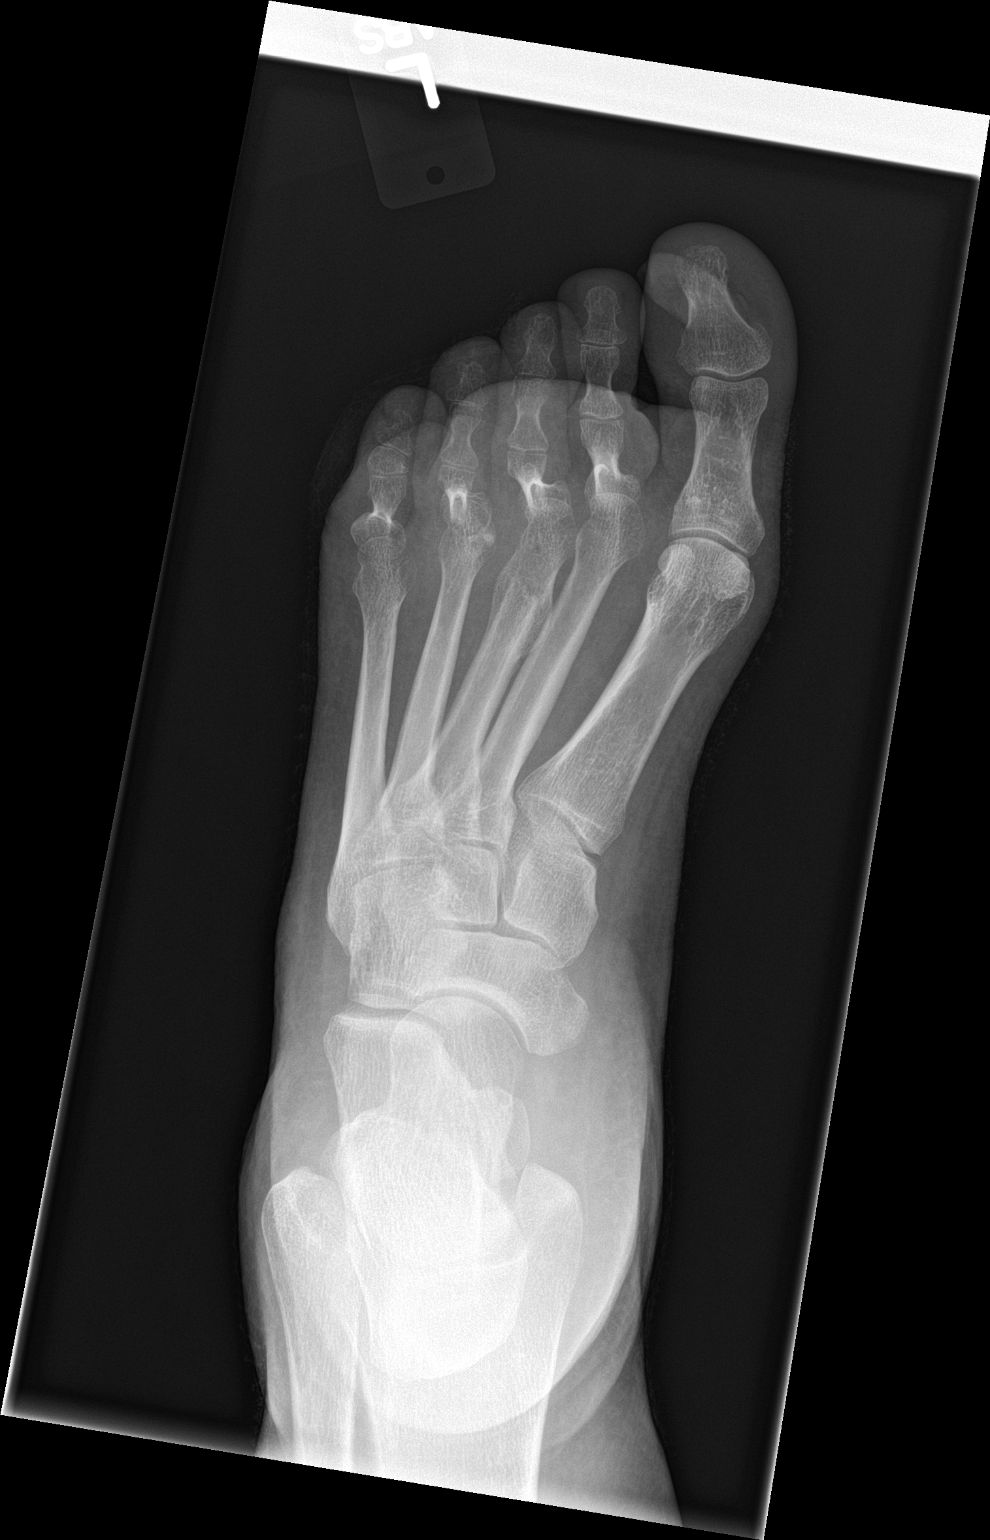
[im 2/3]
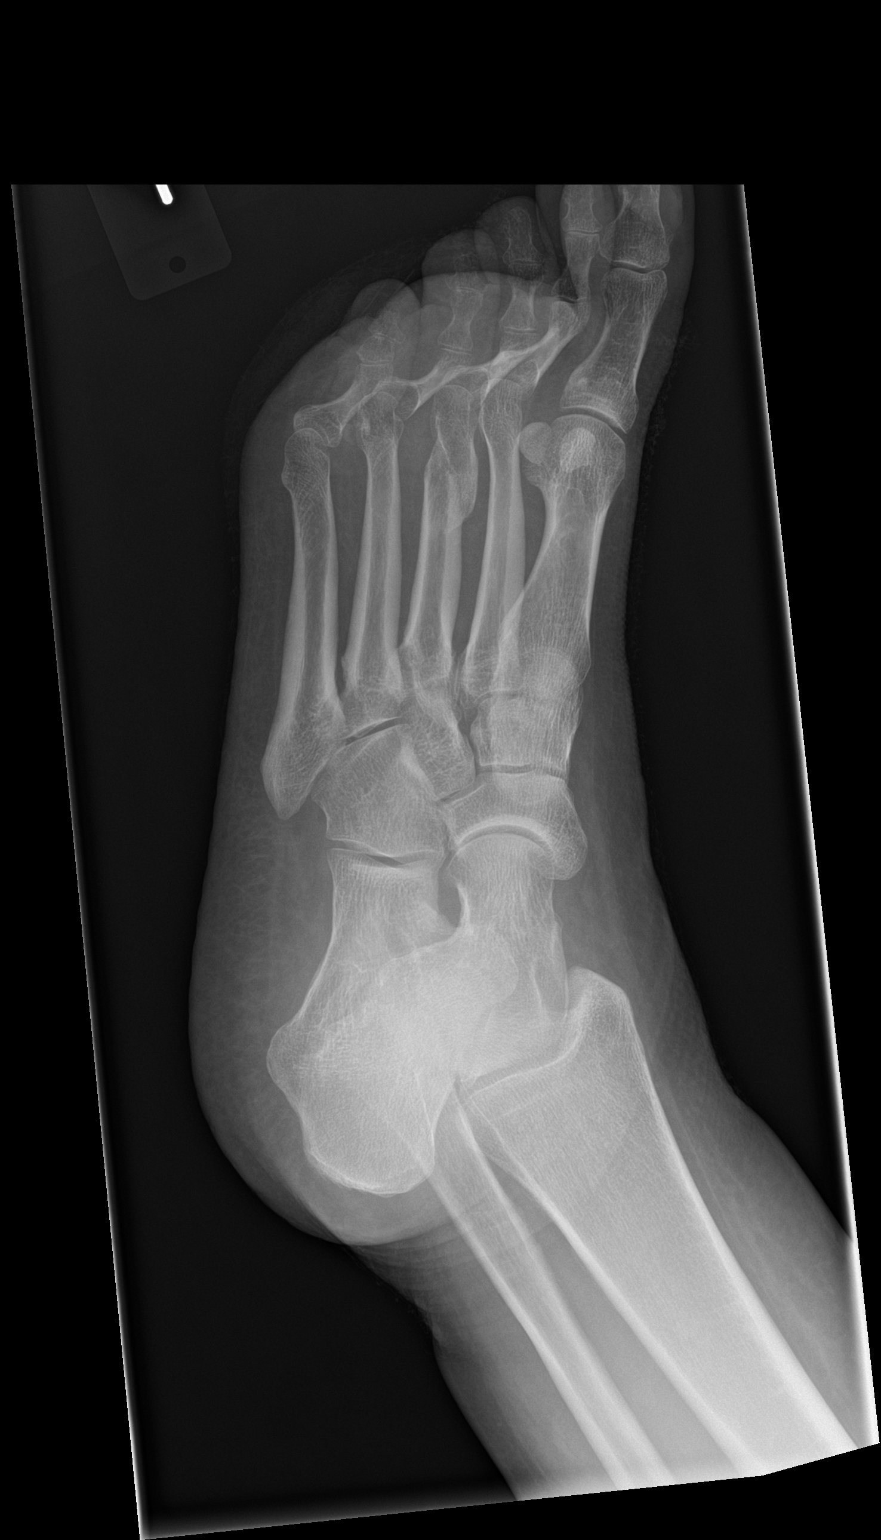
[im 3/3]
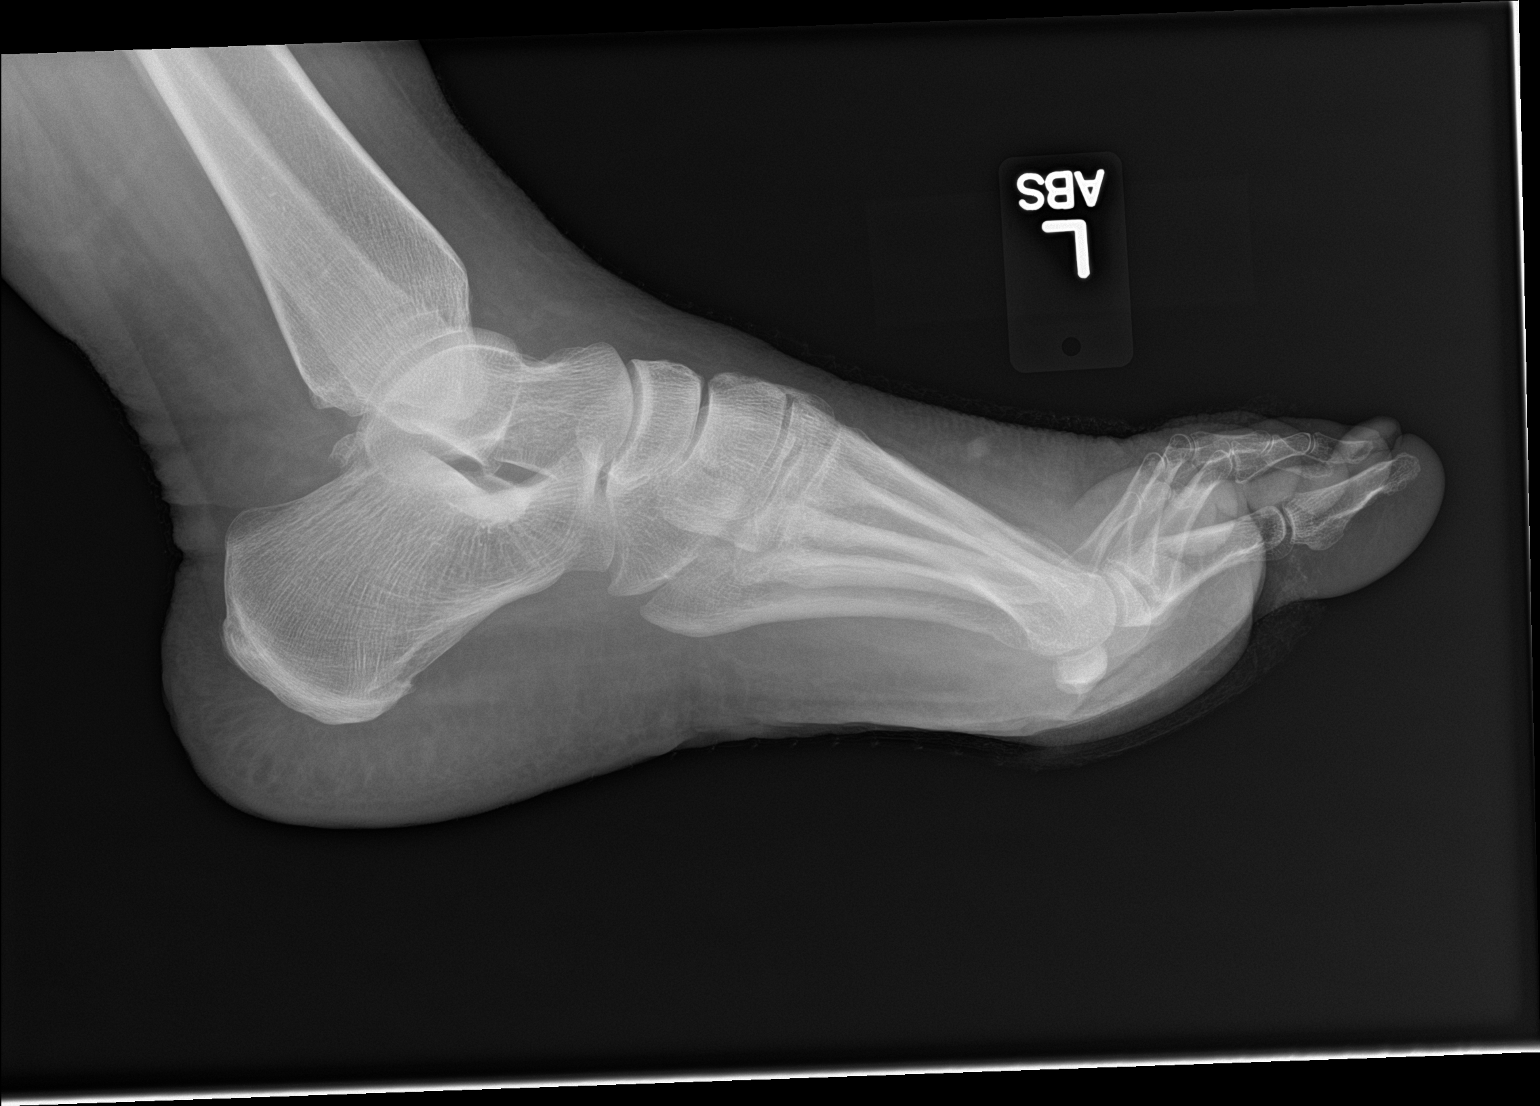

[3 of 3 positions shown; findings below may reference images not displayed]

FINDINGS: No plain film evidence of active osteomyelitis is seen. There is a
healed fracture of the distal left third metatarsal noted. No
erosions are seen.
IMPRESSION: No evidence of osteomyelitis. Healed fracture of the distal left
third metatarsal.

## 2016-11-11 ENCOUNTER — Observation Stay
Admission: EM | Admit: 2016-11-11 | Discharge: 2016-11-11 | Disposition: A | Discharge status: Home / Self Care | Payer: Medicaid Other | Attending: Obstetrics and Gynecology | Admitting: Obstetrics and Gynecology

## 2016-11-11 DIAGNOSIS — Z6841 Body Mass Index (BMI) 40.0 and over, adult: Secondary | ICD-10-CM | POA: Diagnosis not present

## 2016-11-11 DIAGNOSIS — E669 Obesity, unspecified: Secondary | ICD-10-CM | POA: Diagnosis not present

## 2016-11-11 DIAGNOSIS — O99333 Smoking (tobacco) complicating pregnancy, third trimester: Secondary | ICD-10-CM | POA: Diagnosis not present

## 2016-11-11 DIAGNOSIS — O99213 Obesity complicating pregnancy, third trimester: Secondary | ICD-10-CM | POA: Diagnosis not present

## 2016-11-11 DIAGNOSIS — R109 Unspecified abdominal pain: Secondary | ICD-10-CM | POA: Insufficient documentation

## 2016-11-11 DIAGNOSIS — Z794 Long term (current) use of insulin: Secondary | ICD-10-CM

## 2016-11-11 DIAGNOSIS — Z3A36 36 weeks gestation of pregnancy: Secondary | ICD-10-CM | POA: Insufficient documentation

## 2016-11-11 DIAGNOSIS — O163 Unspecified maternal hypertension, third trimester: Secondary | ICD-10-CM | POA: Diagnosis not present

## 2016-11-11 DIAGNOSIS — F17210 Nicotine dependence, cigarettes, uncomplicated: Secondary | ICD-10-CM | POA: Diagnosis not present

## 2016-11-11 DIAGNOSIS — Z89422 Acquired absence of other left toe(s): Secondary | ICD-10-CM | POA: Insufficient documentation

## 2016-11-11 DIAGNOSIS — O0933 Supervision of pregnancy with insufficient antenatal care, third trimester: Secondary | ICD-10-CM | POA: Diagnosis not present

## 2016-11-11 DIAGNOSIS — O26893 Other specified pregnancy related conditions, third trimester: Principal | ICD-10-CM | POA: Insufficient documentation

## 2016-11-11 DIAGNOSIS — O24913 Unspecified diabetes mellitus in pregnancy, third trimester: Secondary | ICD-10-CM

## 2016-11-11 HISTORY — DX: Obesity, unspecified: E66.9

## 2016-11-11 HISTORY — DX: Essential (primary) hypertension: I10

## 2016-11-11 HISTORY — DX: Type 2 diabetes mellitus without complications: E11.9

## 2016-11-11 LAB — URINALYSIS, ROUTINE W REFLEX MICROSCOPIC
Bacteria, UA: NONE SEEN
Bilirubin Urine: NEGATIVE
GLUCOSE, UA: NEGATIVE mg/dL
Ketones, ur: 5 mg/dL — AB
LEUKOCYTES UA: NEGATIVE
Nitrite: NEGATIVE
PH: 6 (ref 5.0–8.0)
Protein, ur: 30 mg/dL — AB
Specific Gravity, Urine: 1.024 (ref 1.005–1.030)

## 2016-11-11 LAB — CHLAMYDIA/NGC RT PCR (ARMC ONLY)
Chlamydia Tr: NOT DETECTED
N GONORRHOEAE: NOT DETECTED

## 2016-11-11 NOTE — Discharge Summary (Signed)
Physician Final Progress Note  Patient ID: [name] MRN: [id number] DOB/AGE: 1975-11-07 41 y.o.  Admit date: 11/11/2016 Admitting provider: Tresea Mall, CNM Discharge date: 11/11/2016   Admission Diagnoses: abdominal pain  Discharge Diagnoses:  Active Problems:   Indication for care in labor and delivery, antepartum IUP at [redacted]w[redacted]d by U/S with reactive NST, not in labor, dehydrated  History of Present Illness: The patient is a 41 y.o. female [phone] at [redacted]w[redacted]d who presents for 3 days of abdominal pain, cramping. She has recently moved to [place] from [place] and has not had prenatal care since March. The patient is high risk with Insulin Dependent Diabetes, hypertension, obesity, limited prenatal care. She does check her blood sugar daily- usually less than 4 times per day. She states her insulin dose is working well to control her blood sugar. She does not regularly check her blood pressure but does take her anti hypertensive medication. She states the abdominal pain has been constant at times in the last few days and other times it is intermittent. She denies LOF, VB, burning with urination. She admits positive fetal movement.    Past Medical History:  Diagnosis Date  . Diabetes mellitus without complication (HCC)   . Hypertension   . Obesity     Past Surgical History:  Procedure Laterality Date  . CESAREAN SECTION    . PERIPHERAL VASCULAR CATHETERIZATION N/A 03/29/2015   Procedure: PICC Line Insertion;  Surgeon: Annice Needy, MD;  Location: [facility] INVASIVE CV LAB;  Service: Cardiovascular;  Laterality: N/A;  . 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    No current outpatient prescriptions on file prior to encounter.    No Known Allergies  Social History   Social History  . Marital status: Married    Spouse name: N/A  . Number of children: N/A  . Years of education: N/A   Occupational History   . Not on file.   Social History Main Topics  . Smoking status: Current Every Day Smoker    Packs/day: 0.50    Types: Cigarettes  . Smokeless tobacco: Never Used  . Alcohol use No  . Drug use: No  . Sexual activity: Not Currently   Other Topics Concern  . Not on file   Social History Narrative  . No narrative on file    Physical Exam: BP 138/68 (BP Location: Left Arm)   Pulse (!) 54   Temp 97.9 F (36.6 C) (Oral)   Ht 5\' 9"  (1.753 m)   Wt (!) 312 lb (141.5 kg)   LMP 02/28/2016 (Approximate)   BMI 46.07 kg/m   Gen: NAD CV: RRR Pulm: CTAB Pelvic: FT/thick/-3 Toco: negative Fetal Well Being: 125 bpm, moderate variability, +accelerations, -decelerations Ext: mild edema  Consults: None  Significant Findings/ Diagnostic Studies: labs:   Results for [name], [name] (MRN [id number]) as of 11/11/2016 09:45  Ref. Range 11/11/2016 08:44  URINALYSIS, ROUTINE W REFLEX MICROSCOPIC Unknown Rpt (A)  Appearance Latest Ref Range: CLEAR  HAZY (A)  Bacteria, UA Latest Ref Range: NONE SEEN  NONE SEEN  Bilirubin Urine Latest Ref Range: NEGATIVE  NEGATIVE  Ca Oxalate Crys, UA Unknown PRESENT  Color, Urine Latest Ref Range: YELLOW  YELLOW (A)  Glucose Latest Ref Range: NEGATIVE mg/dL NEGATIVE  Hgb urine dipstick Latest Ref Range: NEGATIVE  MODERATE (A)  Ketones, ur Latest Ref Range: NEGATIVE mg/dL 5 (A)  Leukocytes, UA Latest Ref  Range: NEGATIVE  NEGATIVE  Mucous Unknown PRESENT  Nitrite Latest Ref Range: NEGATIVE  NEGATIVE  pH Latest Ref Range: 5.0 - 8.0  6.0  Protein Latest Ref Range: NEGATIVE mg/dL 30 (A)  RBC / HPF Latest Ref Range: 0 - 5 RBC/hpf 0-5  Specific Gravity, Urine Latest Ref Range: 1.005 - 1.030  1.024  Squamous Epithelial / LPF Latest Ref Range: NONE SEEN  0-5 (A)  WBC, UA Latest Ref Range: 0 - 5 WBC/hpf 0-5  CULTURE, BETA STREP (GROUP B ONLY) Unknown Rpt    Procedures: NST  Discharge Condition: good  Disposition: 01-Home or Self Care  Diet: Regular  diet, Encourage fluids and decreased sugar/carbohydrates  Discharge Activity: Activity as tolerated   Allergies as of 11/11/2016   No Known Allergies     Medication List    TAKE these medications   insulin aspart 100 UNIT/ML injection Commonly known as:  novoLOG Inject into the skin 3 (three) times daily before meals.   insulin regular 100 units/mL injection Commonly known as:  NOVOLIN R,HUMULIN R Inject into the skin 3 (three) times daily before meals.   metoprolol tartrate 25 MG tablet Commonly known as:  LOPRESSOR Take 25 mg by mouth 2 (two) times daily.   simvastatin 40 MG tablet Commonly known as:  ZOCOR Take 40 mg by mouth daily.      Follow-up Information    [facility].   Why:  [facility] to insulin dependent diabetes          Total time spent taking care of this patient: 20 minutes  Signed: Tresea MallJane Casimira Sutphin, CNM  11/11/2016, 9:36 AM

## 2016-11-11 NOTE — OB Triage Note (Signed)
Pt states she has been having some cramping the past 3-4 days in her lower abdomen that feel like menstrual cramps, denies any discharge or bleeding. Last BM was 0300 today

## 2016-11-13 LAB — CULTURE, BETA STREP (GROUP B ONLY)

## 2016-12-10 ENCOUNTER — Emergency Department
Admission: EM | Admit: 2016-12-10 | Discharge: 2016-12-10 | Disposition: A | Discharge status: Home / Self Care | Payer: Medicaid Other | Attending: Emergency Medicine | Admitting: Emergency Medicine

## 2016-12-10 DIAGNOSIS — F17210 Nicotine dependence, cigarettes, uncomplicated: Secondary | ICD-10-CM | POA: Diagnosis not present

## 2016-12-10 DIAGNOSIS — K029 Dental caries, unspecified: Secondary | ICD-10-CM

## 2016-12-10 DIAGNOSIS — I10 Essential (primary) hypertension: Secondary | ICD-10-CM | POA: Insufficient documentation

## 2016-12-10 DIAGNOSIS — E119 Type 2 diabetes mellitus without complications: Secondary | ICD-10-CM | POA: Diagnosis not present

## 2016-12-10 DIAGNOSIS — Z794 Long term (current) use of insulin: Secondary | ICD-10-CM | POA: Diagnosis not present

## 2016-12-10 DIAGNOSIS — K0889 Other specified disorders of teeth and supporting structures: Secondary | ICD-10-CM | POA: Diagnosis present

## 2016-12-10 DIAGNOSIS — Z79899 Other long term (current) drug therapy: Secondary | ICD-10-CM | POA: Diagnosis not present

## 2016-12-10 MED ORDER — IBUPROFEN 600 MG PO TABS: 600.0000 mg | ORAL_TABLET | Freq: Four times a day (QID) | ORAL | 0 refills | Status: DC | PRN

## 2016-12-10 MED ORDER — LIDOCAINE VISCOUS 2 % MT SOLN
15.0000 mL | Freq: Once | OROMUCOSAL | Status: AC
  Administered 2016-12-10: 15 mL via OROMUCOSAL
  Filled 2016-12-10: qty 15

## 2016-12-10 MED ORDER — TRAMADOL HCL 50 MG PO TABS: 50.0000 mg | ORAL_TABLET | Freq: Four times a day (QID) | ORAL | 0 refills | Status: DC | PRN

## 2016-12-10 MED ORDER — IBUPROFEN 600 MG PO TABS
600.0000 mg | ORAL_TABLET | Freq: Once | ORAL | Status: AC
  Administered 2016-12-10: 600 mg via ORAL
  Filled 2016-12-10: qty 1

## 2016-12-10 MED ORDER — AMOXICILLIN 500 MG PO CAPS: 500.0000 mg | ORAL_CAPSULE | Freq: Three times a day (TID) | ORAL | 0 refills | Status: DC

## 2016-12-10 NOTE — ED Triage Notes (Signed)
Rt sided dental pain and swelling that began yesterday.

## 2016-12-10 NOTE — ED Provider Notes (Signed)
[facility] Emergency Department Provider Note   ____________________________________________   First MD Initiated Contact with Patient 12/10/16 1247     (approximate)  I have reviewed the triage vital signs and the nursing notes.   HISTORY  Chief Complaint Dental Pain    HPI [name] is a 41 y.o. female  Patient complaining of right-sided dental pain and swelling that began yesterday. Patient has history of devitalized teeth. Patient states she does not have a dentist because she recently relocated to this area. Patient rating the pain as a 10 over 10. Patient describes the pain as "achy". No palliative measures for complaint. Patient denies any fever with this complaint.   Past Medical History:  Diagnosis Date  . Diabetes mellitus without complication (HCC)   . Hypertension   . Obesity     Patient Active Problem List   Diagnosis Date Noted  . Indication for care in labor and delivery, antepartum 11/11/2016  . 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 CESAREAN SECTION    . PERIPHERAL VASCULAR CATHETERIZATION N/A 03/29/2015   Procedure: PICC Line Insertion;  Surgeon: Annice NeedyJason S Dew, MD;  Location: [facility] INVASIVE CV LAB;  Service: Cardiovascular;  Laterality: N/A;  . 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 Authorizing Provider  amoxicillin (AMOXIL) 500 MG capsule Take 1 capsule (500 mg total) by mouth 3 (three) times daily. 12/10/16   Joni ReiningSmith, Ronald K, PA-C  ibuprofen (ADVIL,MOTRIN) 600 MG tablet Take 1 tablet (600 mg total) by mouth every 6 (six) hours as needed. 12/10/16   Joni ReiningSmith, Ronald K, PA-C  insulin aspart (NOVOLOG) 100 UNIT/ML injection Inject into the skin 3 (three) times daily before meals.    [provider]  insulin  regular (NOVOLIN R,HUMULIN R) 100 units/mL injection Inject into the skin 3 (three) times daily before meals.    [provider]  metoprolol tartrate (LOPRESSOR) 25 MG tablet Take 25 mg by mouth 2 (two) times daily.    [provider]  simvastatin (ZOCOR) 40 MG tablet Take 40 mg by mouth daily.    [provider]  traMADol (ULTRAM) 50 MG tablet Take 1 tablet (50 mg total) by mouth every 6 (six) hours as needed for moderate pain. 12/10/16   Joni ReiningSmith, Ronald K, PA-C    Allergies Patient has no known allergies.  No family history on file.  Social History Social History  Substance Use Topics  . Smoking status: Current Every Day Smoker    Packs/day: 0.50    Types: Cigarettes  . Smokeless tobacco: Never Used  . Alcohol use No    Review of Systems Constitutional: No fever/chills Eyes: No visual changes. ENT: No sore throat. pain Cardiovascular: Denies chest pain. Respiratory: Denies shortness of breath. Gastrointestinal: No abdominal pain.  No nausea, no vomiting.  No diarrhea.  No constipation. Genitourinary: Negative for dysuria. Musculoskeletal: Negative for back pain. Skin: Negative for rash. Neurological: Negative for headaches, focal weakness or numbness. Endocrine: diabetes and hypertension ____________________________________________   PHYSICAL EXAM:  VITAL SIGNS: ED Triage Vitals  Enc Vitals Group     BP 12/10/16 1220 (!) 152/87     Pulse Rate 12/10/16 1220 92     Resp 12/10/16 1220 18     Temp 12/10/16  1220 97.8 F (36.6 C)     Temp Source 12/10/16 1220 Oral     SpO2 12/10/16 1220 97 %     Weight 12/10/16 1221 265 lb (120.2 kg)     Height 12/10/16 1221 5\' 9"  (1.753 m)     Head Circumference --      Peak Flow --      Pain Score 12/10/16 1220 10     Pain Loc --      Pain Edu? --      Excl. in GC? --     Constitutional: Alert and oriented. Well appearing and in no acute distress.  Morbid obesity Head: Atraumatic. Nose: No  congestion/rhinnorhea. Mouth/Throat: Mucous membranes are moist.  Oropharynx non-erythematous.  Devitalize tooth #28 and 29 with gingival edema Neck: No stridor.  Cardiovascular: Normal rate, regular rhythm. Grossly normal heart sounds.  Good peripheral circulation. Respiratory: Normal respiratory effort.  No retractions. Lungs CTAB. Neurologic:  Normal speech and language. No gross focal neurologic deficits are appreciated. No gait instability. Skin:  Skin is warm, dry and intact. No rash noted. Psychiatric: Mood and affect are normal. Speech and behavior are normal.  ____________________________________________   LABS (all labs ordered are listed, but only abnormal results are displayed)  Labs Reviewed - No data to display ____________________________________________  EKG   ____________________________________________  RADIOLOGY  No results found.  ____________________________________________   PROCEDURES  Procedure(s) performed: None  Procedures  Critical Care performed: No  ____________________________________________   INITIAL IMPRESSION / ASSESSMENT AND PLAN / ED COURSE  Pertinent labs & imaging results that were available during my care of the patient were reviewed by me and considered in my medical decision making (see chart for details).   dental pain secondary to multiple caries. Patient given discharge care instructions. Patient advised follow-up with walk-in dental clinic tomorrow.      ____________________________________________   FINAL CLINICAL IMPRESSION(S) / ED DIAGNOSES  Final diagnoses:  Pain due to dental caries      NEW MEDICATIONS STARTED DURING THIS VISIT:  New Prescriptions   AMOXICILLIN (AMOXIL) 500 MG CAPSULE    Take 1 capsule (500 mg total) by mouth 3 (three) times daily.   IBUPROFEN (ADVIL,MOTRIN) 600 MG TABLET    Take 1 tablet (600 mg total) by mouth every 6 (six) hours as needed.   TRAMADOL (ULTRAM) 50 MG TABLET    Take 1  tablet (50 mg total) by mouth every 6 (six) hours as needed for moderate pain.     Note:  This document was prepared using Dragon voice recognition software and may include unintentional dictation errors.    Joni Reining, PA-C 12/10/16 1300    Don Perking, [place], MD 12/13/16 [phone]

## 2016-12-10 NOTE — ED Notes (Signed)
Pt reports that she has an abscess tooth on lower right jaw - this started yesterday afternoon - she states she has no insurance so she cannot go to dentist

## 2016-12-12 ENCOUNTER — Encounter: Payer: Self-pay | Admitting: Medical Oncology

## 2016-12-12 ENCOUNTER — Emergency Department
Admission: EM | Admit: 2016-12-12 | Discharge: 2016-12-12 | Disposition: A | Discharge status: Home / Self Care | Payer: Medicaid Other | Attending: Emergency Medicine | Admitting: Emergency Medicine

## 2016-12-12 ENCOUNTER — Emergency Department: Payer: Medicaid Other

## 2016-12-12 DIAGNOSIS — R05 Cough: Secondary | ICD-10-CM | POA: Diagnosis present

## 2016-12-12 DIAGNOSIS — Z794 Long term (current) use of insulin: Secondary | ICD-10-CM | POA: Insufficient documentation

## 2016-12-12 DIAGNOSIS — E10621 Type 1 diabetes mellitus with foot ulcer: Secondary | ICD-10-CM | POA: Insufficient documentation

## 2016-12-12 DIAGNOSIS — I10 Essential (primary) hypertension: Secondary | ICD-10-CM | POA: Diagnosis not present

## 2016-12-12 DIAGNOSIS — J189 Pneumonia, unspecified organism: Secondary | ICD-10-CM | POA: Diagnosis not present

## 2016-12-12 DIAGNOSIS — F17210 Nicotine dependence, cigarettes, uncomplicated: Secondary | ICD-10-CM | POA: Insufficient documentation

## 2016-12-12 DIAGNOSIS — L97509 Non-pressure chronic ulcer of other part of unspecified foot with unspecified severity: Secondary | ICD-10-CM | POA: Diagnosis not present

## 2016-12-12 MED ORDER — AZITHROMYCIN 250 MG PO TABS: ORAL_TABLET | ORAL | 0 refills | Status: AC

## 2016-12-12 MED ORDER — PSEUDOEPH-BROMPHEN-DM 30-2-10 MG/5ML PO SYRP: 5.0000 mL | ORAL_SOLUTION | Freq: Four times a day (QID) | ORAL | 0 refills | Status: DC | PRN

## 2016-12-12 NOTE — ED Triage Notes (Signed)
Pt reports cough, congestion and sob that began yesterday. Pt in NAD.

## 2016-12-12 NOTE — ED Provider Notes (Signed)
[facility] Emergency Department Provider Note   ____________________________________________   First MD Initiated Contact with Patient 12/12/16 [phone]     (approximate)  I have reviewed the triage vital signs and the nursing notes.   HISTORY  Chief Complaint Cough and Shortness of Breath    HPI [name] is a 41 y.o. female patient complain of acute onset of cough, chest congestion, and dyspnea that began yesterday. Patient denies any fever associated this complaint. Patient state exertion increases congestion and cough.Patient denies chest pain.   Past Medical History:  Diagnosis Date  . Diabetes mellitus without complication (HCC)   . Hypertension   . Obesity     Patient Active Problem List   Diagnosis Date Noted  . Indication for care in labor and delivery, antepartum 11/11/2016  . 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 CESAREAN SECTION    . PERIPHERAL VASCULAR CATHETERIZATION N/A 03/29/2015   Procedure: PICC Line Insertion;  Surgeon: Annice Needy, MD;  Location: [facility] INVASIVE CV LAB;  Service: Cardiovascular;  Laterality: N/A;  . 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 Authorizing Provider  amoxicillin (AMOXIL) 500 MG capsule Take 1 capsule (500 mg total) by mouth 3 (three) times daily. 12/10/16   Joni Reining, PA-C  azithromycin (ZITHROMAX Z-PAK) 250 MG tablet Take 2 tablets (500 mg) on  Day 1,  followed by 1 tablet (250 mg) once daily on Days 2 through 5. 12/12/16 12/17/16  Joni Reining, PA-C  brompheniramine-pseudoephedrine-DM 30-2-10 MG/5ML syrup Take 5 mLs by mouth 4 (four) times daily as needed. 12/12/16   Joni Reining, PA-C  ibuprofen (ADVIL,MOTRIN) 600 MG tablet Take 1 tablet (600 mg total) by mouth every 6 (six)  hours as needed. 12/10/16   Joni Reining, PA-C  insulin aspart (NOVOLOG) 100 UNIT/ML injection Inject into the skin 3 (three) times daily before meals.    [provider]  insulin regular (NOVOLIN R,HUMULIN R) 100 units/mL injection Inject into the skin 3 (three) times daily before meals.    [provider]  metoprolol tartrate (LOPRESSOR) 25 MG tablet Take 25 mg by mouth 2 (two) times daily.    [provider]  simvastatin (ZOCOR) 40 MG tablet Take 40 mg by mouth daily.    [provider]  traMADol (ULTRAM) 50 MG tablet Take 1 tablet (50 mg total) by mouth every 6 (six) hours as needed for moderate pain. 12/10/16   Joni Reining, PA-C    Allergies Patient has no known allergies.  No family history on file.  Social History Social History  Substance Use Topics  . Smoking status: Current Every Day Smoker    Packs/day: 0.50    Types: Cigarettes  . Smokeless tobacco: Never Used  . Alcohol use No    Review of Systems  Constitutional: No fever/chills Eyes: No visual changes. ENT: No sore throat. Cardiovascular: Denies chest pain. Respiratory: Shortness of breath and cough Gastrointestinal: No abdominal pain.  No nausea, no vomiting.  No diarrhea.  No constipation. Genitourinary: Negative for dysuria. Musculoskeletal: Negative for back pain. Skin: Negative for rash. Neurological: Negative for headaches, focal weakness or numbness. Endocrine:Diabetes and hypertension ____________________________________________   PHYSICAL EXAM:  VITAL SIGNS: ED Triage Vitals  Enc  Vitals Group     BP 12/12/16 0837 137/63     Pulse Rate 12/12/16 0837 88     Resp 12/12/16 0837 18     Temp 12/12/16 0837 97.6 F (36.4 C)     Temp Source 12/12/16 0837 Oral     SpO2 12/12/16 0837 99 %     Weight 12/12/16 0839 265 lb (120.2 kg)     Height 12/12/16 0839 5\' 9"  (1.753 m)     Head Circumference --      Peak Flow --      Pain Score 12/12/16 0837 0     Pain Loc  --      Pain Edu? --      Excl. in GC? --     Constitutional: Alert and oriented. Well appearing and in no acute distress. Eyes: Conjunctivae are normal. PERRL. EOMI. Head: Atraumatic. Nose: No congestion/rhinnorhea. Mouth/Throat: Mucous membranes are moist.  Oropharynx non-erythematous. Neck: No stridor.  No cervical spine tenderness to palpation.* Hematological/Lymphatic/Immunilogical: No cervical lymphadenopathy. Cardiovascular: Normal rate, regular rhythm. Grossly normal heart sounds.  Good peripheral circulation. Respiratory: Normal respiratory effort.  No retractions. Bilateral rhonchi . Gastrointestinal: Soft and nontender. No distention. No abdominal bruits. No CVA tenderness. Musculoskeletal: No lower extremity tenderness nor edema.  No joint effusions. Neurologic:  Normal speech and language. No gross focal neurologic deficits are appreciated. No gait instability. Skin:  Skin is warm, dry and intact. No rash noted. Psychiatric: Mood and affect are normal. Speech and behavior are normal.  ____________________________________________   LABS (all labs ordered are listed, but only abnormal results are displayed)  Labs Reviewed - No data to display ____________________________________________  EKG   ____________________________________________  RADIOLOGY  Dg Chest 2 View  Result Date: 12/12/2016 CLINICAL DATA:  Chest pain, shortness of breath. EXAM: CHEST  2 VIEW COMPARISON:  Radiographs of Oct 31, 2012. FINDINGS: The heart size and mediastinal contours are within normal limits. No pneumothorax or pleural effusion is noted. Bilateral ill-defined interstitial and patchy opacities are noted in both lungs concerning for edema or possibly multifocal pneumonia. The visualized skeletal structures are unremarkable. IMPRESSION: Interval development of ill-defined patchy and interstitial opacities in both lungs concerning for edema or or possibly multifocal pneumonia. Clinical  correlation is recommended. Electronically Signed   By: Lupita Raider, M.D.   On: 12/12/2016 09:13    ____________________________________________   PROCEDURES  Procedure(s) performed: None  Procedures  Critical Care performed: No  ____________________________________________   INITIAL IMPRESSION / ASSESSMENT AND PLAN / ED COURSE  Pertinent labs & imaging results that were available during my care of the patient were reviewed by me and considered in my medical decision making (see chart for details).  Pneumonia. Patient given discharge care instructions. Patient advised to follow-up with PCP in 3 days if no improvement.      ____________________________________________   FINAL CLINICAL IMPRESSION(S) / ED DIAGNOSES  Final diagnoses:  Community acquired pneumonia, unspecified laterality      NEW MEDICATIONS STARTED DURING THIS VISIT:  Discharge Medication List as of 12/12/2016  9:41 AM    START taking these medications   Details  azithromycin (ZITHROMAX Z-PAK) 250 MG tablet Take 2 tablets (500 mg) on  Day 1,  followed by 1 tablet (250 mg) once daily on Days 2 through 5., Print    brompheniramine-pseudoephedrine-DM 30-2-10 MG/5ML syrup Take 5 mLs by mouth 4 (four) times daily as needed., Starting Fri 12/12/2016, Print         Note:  This document was prepared using Dragon voice recognition software and may include unintentional dictation errors.    Joni ReiningSmith, Avyonna Wagoner K, PA-C 12/12/16 1421    Nita SickleVeronese, [place], MD 12/13/16 [phone]

## 2016-12-12 NOTE — ED Notes (Signed)
See triage note  States she developed some SOB yesterday  And feels like her chest is congested  States she has occasional cough d/t smoking and unsure of fever  Recently placed on tramadol and amoxil for dental abscess   Unsure if theses sx's are related to those meds

## 2017-01-10 ENCOUNTER — Emergency Department
Admission: EM | Admit: 2017-01-10 | Discharge: 2017-01-10 | Disposition: A | Discharge status: Home / Self Care | Payer: Medicaid Other | Attending: Emergency Medicine | Admitting: Emergency Medicine

## 2017-01-10 ENCOUNTER — Encounter: Payer: Self-pay | Admitting: Medical Oncology

## 2017-01-10 DIAGNOSIS — I10 Essential (primary) hypertension: Secondary | ICD-10-CM | POA: Insufficient documentation

## 2017-01-10 DIAGNOSIS — F17210 Nicotine dependence, cigarettes, uncomplicated: Secondary | ICD-10-CM | POA: Diagnosis not present

## 2017-01-10 DIAGNOSIS — E109 Type 1 diabetes mellitus without complications: Secondary | ICD-10-CM | POA: Diagnosis not present

## 2017-01-10 DIAGNOSIS — Z79899 Other long term (current) drug therapy: Secondary | ICD-10-CM | POA: Insufficient documentation

## 2017-01-10 DIAGNOSIS — Z794 Long term (current) use of insulin: Secondary | ICD-10-CM | POA: Diagnosis not present

## 2017-01-10 DIAGNOSIS — K029 Dental caries, unspecified: Secondary | ICD-10-CM | POA: Insufficient documentation

## 2017-01-10 DIAGNOSIS — K047 Periapical abscess without sinus: Secondary | ICD-10-CM | POA: Diagnosis not present

## 2017-01-10 DIAGNOSIS — K0889 Other specified disorders of teeth and supporting structures: Secondary | ICD-10-CM | POA: Diagnosis present

## 2017-01-10 MED ORDER — PENICILLIN V POTASSIUM 500 MG PO TABS
500.0000 mg | ORAL_TABLET | Freq: Once | ORAL | Status: AC
  Administered 2017-01-10: 500 mg via ORAL
  Filled 2017-01-10: qty 1

## 2017-01-10 MED ORDER — LIDOCAINE-EPINEPHRINE 2 %-1:100000 IJ SOLN
1.7000 mL | Freq: Once | INTRAMUSCULAR | Status: AC
  Administered 2017-01-10: 1.7 mL
  Filled 2017-01-10: qty 1.7

## 2017-01-10 MED ORDER — TRAMADOL HCL 50 MG PO TABS: 50.0000 mg | ORAL_TABLET | Freq: Two times a day (BID) | ORAL | 0 refills | Status: DC

## 2017-01-10 MED ORDER — PENICILLIN V POTASSIUM 500 MG PO TABS: 500.0000 mg | ORAL_TABLET | Freq: Four times a day (QID) | ORAL | 0 refills | Status: DC

## 2017-01-10 NOTE — ED Notes (Signed)
Pt verbalizes understanding of discharge instructions.

## 2017-01-10 NOTE — ED Provider Notes (Signed)
[facility] Emergency Department Provider Note ____________________________________________  Time seen: [id number]  I have reviewed the triage vital signs and the nursing notes.  HISTORY  Chief Complaint  Dental Pain  HPI [name] is a 41 y.o. female visits to the ED for evaluation of right lower dental abscess. Patient reports pain and swelling to the lower right molar. She denies any fevers, chills, sweats, spontaneous drainage or swelling.   Past Medical History:  Diagnosis Date  . Diabetes mellitus without complication (HCC)   . Hypertension   . Obesity     Patient Active Problem List   Diagnosis Date Noted  . Indication for care in labor and delivery, antepartum 11/11/2016  . 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 CESAREAN SECTION    . PERIPHERAL VASCULAR CATHETERIZATION N/A 03/29/2015   Procedure: PICC Line Insertion;  Surgeon: Annice NeedyJason S Dew, MD;  Location: [facility] INVASIVE CV LAB;  Service: Cardiovascular;  Laterality: N/A;  . 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 Authorizing Provider  amoxicillin (AMOXIL) 500 MG capsule Take 1 capsule (500 mg total) by mouth 3 (three) times daily. 12/10/16   Joni ReiningSmith, Ronald K, PA-C  brompheniramine-pseudoephedrine-DM 30-2-10 MG/5ML syrup Take 5 mLs by mouth 4 (four) times daily as needed. 12/12/16   Joni ReiningSmith, Ronald K, PA-C  ibuprofen (ADVIL,MOTRIN) 600 MG tablet Take 1 tablet (600 mg total) by mouth every 6 (six) hours as needed. 12/10/16   Joni ReiningSmith, Ronald K, PA-C  insulin aspart (NOVOLOG) 100 UNIT/ML injection Inject into the skin 3 (three) times daily before meals.    [provider]  insulin regular (NOVOLIN R,HUMULIN R) 100 units/mL injection Inject into the skin 3 (three) times daily before meals.     [provider]  metoprolol tartrate (LOPRESSOR) 25 MG tablet Take 25 mg by mouth 2 (two) times daily.    [provider]  penicillin v potassium (VEETID) 500 MG tablet Take 1 tablet (500 mg total) by mouth 4 (four) times daily. 01/10/17   Syd Newsome, Charlesetta IvoryJenise V Bacon, PA-C  simvastatin (ZOCOR) 40 MG tablet Take 40 mg by mouth daily.    [provider]  traMADol (ULTRAM) 50 MG tablet Take 1 tablet (50 mg total) by mouth 2 (two) times daily. 01/10/17   Cristin Penaflor, Charlesetta IvoryJenise V Bacon, PA-C    Allergies Patient has no known allergies.  No family history on file.  Social History Social History  Substance Use Topics  . Smoking status: Current Every Day Smoker    Packs/day: 0.50    Types: Cigarettes  . Smokeless tobacco: Never Used  . Alcohol use No    Review of Systems  Constitutional: Negative for fever. Eyes: Negative for visual changes. ENT: Negative for sore throat. Dental pain as above. Cardiovascular: Negative for chest pain. Respiratory: Negative for shortness of breath. Gastrointestinal: Negative for abdominal pain, vomiting and diarrhea. Neurological: Negative for headaches, focal weakness or numbness. ____________________________________________  PHYSICAL EXAM:  VITAL SIGNS: ED Triage Vitals [01/10/17 1032]  Enc Vitals Group     BP (!) 141/77     Pulse Rate 82     Resp 18     Temp 99.1 F (37.3 C)     Temp Source Oral     SpO2 97 %  Weight 265 lb (120.2 kg)     Height 5\' 9"  (1.753 m)     Head Circumference      Peak Flow      Pain Score 7     Pain Loc      Pain Edu?      Excl. in GC?     Constitutional: Alert and oriented. Well appearing and in no distress. Head: Normocephalic and atraumatic. Eyes: Conjunctivae are normal. PERRL. Normal extraocular movements Mouth/Throat: Mucous membranes are moist. Uvula is midline tonsils are flat. No oropharyngeal lesions appreciated. Patient with some early lateral gum swelling to the second molar on  the right lower jaw. No brawny, sublingual edema is noted. Neck: Supple. No thyromegaly. Hematological/Lymphatic/Immunological: No cervical lymphadenopathy. Cardiovascular: Normal rate, regular rhythm. Normal distal pulses. Respiratory: Normal respiratory effort. No wheezes/rales/rhonchi. Neurologic:  Normal gait without ataxia. Normal speech and language. No gross focal neurologic deficits are appreciated. ____________________________________________  PROCEDURES  Pen VK 500 mg PO  DENTAL BLOCK  Performed by: Lissa Hoard Consent: Verbal consent obtained. Required items: devices and special equipment available Time out: Immediately prior to procedure a "time out" was called to verify the correct patient, procedure, equipment, support staff and site/side marked as required.  Indication: pain  Nerve block body site: right lower 2nd molar  Preparation: Patient was prepped and draped in the usual sterile fashion. Needle gauge: 27 G Location technique: anatomical landmarks  Local anesthetic: lido-epi 2%-1:100000  Anesthetic total: 1 ml  Outcome: pain improved Patient tolerance: Patient tolerated the procedure well with no immediate complications. ____________________________________________  INITIAL IMPRESSION / ASSESSMENT AND PLAN / ED COURSE  Patient with the ED evaluation of acute dental pain secondary to dental caries. Patient is discharged with prescription for pen VK as well as Ultram dose as directed. She'll follow with a local dental provider for definitive management. Return precautions are reviewed. ____________________________________________  FINAL CLINICAL IMPRESSION(S) / ED DIAGNOSES  Final diagnoses:  Pain due to dental caries  Dental infection      Lissa Hoard, PA-C 01/10/17 1843    Jene Every, MD 01/11/17 1132

## 2017-01-10 NOTE — ED Triage Notes (Signed)
Pt reports Right lower dental abscess.

## 2017-01-10 NOTE — Discharge Instructions (Signed)
You should take the prescription antibiotic as directed. Take the pain medicine as needed. Follow-up with [facility] or one of the dental clinics listed below, for dental extraction.   OPTIONS FOR DENTAL FOLLOW UP CARE  [place] Department of Health and Human Services - Local Safety Net Dental Clinics [email or web address]://www.ncdhhs.gov/dph/oralhealth/services/safetynetclinics.htm   [facility] [phone])  [facility] [phone])  [facility] [phone])  [facility] [phone])  [facility] [phone]) This clinic caters to the indigent population and is on a lottery system. Location: [employer], [address], [address], [place] [facility] Hours: Wednesdays from 6pm - 9pm, patients seen by a lottery system. For dates, call or go to [email or web address] Services: Cleanings, fillings and simple extractions. Payment Options: DENTAL WORK IS FREE OF CHARGE. Bring proof of income or support. Best way to get seen: Arrive at 5:15 pm - this is a lottery, NOT first come/first serve, so arriving earlier will not increase your chances of being seen.     [facility] [phone] Select option 1 for emergencies   Location: [facility], [place], [address], [place] [facility] Hours: No walk-ins accepted - call the day before to schedule an appointment. Check in times are 9:30 am and 1:30 pm. Services: Simple extractions, temporary fillings, pulpectomy/pulp debridement, uncomplicated abscess drainage. Payment Options: PAYMENT IS DUE AT THE TIME OF SERVICE.  Fee is usually $100-200, additional surgical procedures (e.g. abscess drainage) may be extra. Cash, checks, Visa/MasterCard accepted.  Can file Medicaid if patient is covered for dental - patient should call case worker to check. No discount for [facility]  patients. Best way to get seen: MUST call the day before and get onto the schedule. Can usually be seen the next 1-2 days. No walk-ins accepted.     [facility] [phone]   Location: [facility], [address], [place] [facility] Hours: M, W, Th, F 8am or 1:30pm, Tues 9a or 1:30 - first come/first served. Services: Simple extractions, temporary fillings, uncomplicated abscess drainage.  You do not need to be an [place] resident. Payment Options: PAYMENT IS DUE AT THE TIME OF SERVICE. Dental insurance, otherwise sliding scale - bring proof of income or support. Depending on income and treatment needed, cost is usually $50-200. Best way to get seen: Arrive early as it is first come/first served.     [facility] [phone]   Location: [address] Clinic Hours: Mon-Thu 8a-5p Services: Most basic dental services including extractions and fillings. Payment Options: PAYMENT IS DUE AT THE TIME OF SERVICE. Sliding scale, up to 50% off - bring proof if income or support. Medicaid with dental option accepted. Best way to get seen: Call to schedule an appointment, can usually be seen within 2 weeks OR they will try to see walk-ins - show up at 8a or 2p (you may have to wait).     [facility] [phone] [place] RESIDENTS ONLY   Location: [facility], [address], [place], [place] [address] Clinic Hours: By appointment only. Monday - Thursday 8am-5pm, Friday 8am-12pm Services: Cleanings, fillings, extractions. Payment Options: PAYMENT IS DUE AT THE TIME OF SERVICE. Cash, Visa or MasterCard. Sliding scale - $30 minimum per service. Best way to get seen: Come in to office, complete packet and make an appointment - need proof of income or support monies for each  household member and proof of [facility] [place] residence. Usually takes about a month to  get in.     [facility] [phone]   Location: [address] [facility] Hours: Walk-in Urgent Care Dental Services are offered Monday-Friday mornings only. The numbers of emergencies accepted daily is limited to the number of providers available. Maximum 15 - Mondays, Wednesdays & Thursdays Maximum 10 - Tuesdays & Fridays Services: You do not need to be a [place] [facility] [place] resident to be seen for a dental emergency. Emergencies are defined as pain, swelling, abnormal bleeding, or dental trauma. Walkins will receive x-rays if needed. NOTE: Dental cleaning is not an emergency. Payment Options: PAYMENT IS DUE AT THE TIME OF SERVICE. Minimum co-pay is $40.00 for uninsured patients. Minimum co-pay is $3.00 for Medicaid with dental coverage. Dental Insurance is accepted and must be presented at time of visit. Medicare does not cover dental. Forms of payment: Cash, credit card, checks. Best way to get seen: If not previously registered with the clinic, walk-in dental registration begins at 7:15 am and is on a first come/first serve basis. If previously registered with the clinic, call to make an appointment.     [facility] [phone] [place] RESIDENTS ONLY   Location: [address], [place], [place] Clinic Hours: Mon-Thu 10a-2p Services: Extractions only! Payment Options: FREE (donations accepted) - bring proof of income or support Best way to get seen: Call and schedule an appointment OR come at 8am on the 1st Monday of every month (except for holidays) when it is first come/first served.     [facility] [phone]   Location: [address], [place] [facility] Hours: Friday mornings Services, Payment Options, Best way to get seen: Call for info

## 2017-03-01 ENCOUNTER — Encounter: Payer: Self-pay | Admitting: Emergency Medicine

## 2017-03-01 ENCOUNTER — Emergency Department
Admission: EM | Admit: 2017-03-01 | Discharge: 2017-03-01 | Disposition: A | Discharge status: Home / Self Care | Payer: Medicaid Other | Attending: Emergency Medicine | Admitting: Emergency Medicine

## 2017-03-01 DIAGNOSIS — I10 Essential (primary) hypertension: Secondary | ICD-10-CM | POA: Diagnosis not present

## 2017-03-01 DIAGNOSIS — E109 Type 1 diabetes mellitus without complications: Secondary | ICD-10-CM | POA: Diagnosis not present

## 2017-03-01 DIAGNOSIS — Z794 Long term (current) use of insulin: Secondary | ICD-10-CM | POA: Insufficient documentation

## 2017-03-01 DIAGNOSIS — K0889 Other specified disorders of teeth and supporting structures: Secondary | ICD-10-CM | POA: Diagnosis present

## 2017-03-01 DIAGNOSIS — Z89422 Acquired absence of other left toe(s): Secondary | ICD-10-CM | POA: Insufficient documentation

## 2017-03-01 DIAGNOSIS — F17210 Nicotine dependence, cigarettes, uncomplicated: Secondary | ICD-10-CM | POA: Insufficient documentation

## 2017-03-01 MED ORDER — IBUPROFEN 600 MG PO TABS: 600.0000 mg | ORAL_TABLET | Freq: Three times a day (TID) | ORAL | 0 refills | Status: DC | PRN

## 2017-03-01 MED ORDER — PENICILLIN V POTASSIUM 500 MG PO TABS: 500.0000 mg | ORAL_TABLET | Freq: Four times a day (QID) | ORAL | 0 refills | Status: DC

## 2017-03-01 NOTE — Discharge Instructions (Signed)
Make an appointment with a dentist of your choice or call one of the clinics listed below.  OPTIONS FOR DENTAL FOLLOW UP CARE  [place] Department of Health and Human Services - Local Safety Net Dental Clinics [email or web address]   [facility] [phone])  [facility]arl [facility] [phone])  [place] [phone])  [facility] [phone])  [facility] [phone]) This clinic caters to the indigent population and is on a lottery system. Location: [employer] [facility], [employer], [address] [place] Clinic Hours: Wednesdays from 6pm - 9pm, patients seen by a lottery system. For dates, call or go to [email or web address] Services: Cleanings, fillings and simple extractions. Payment Options: DENTAL WORK IS FREE OF CHARGE. Bring proof of income or support. Best way to get seen: Arrive at 5:15 pm - this is a lottery, NOT first come/first serve, so arriving earlier will not increase your chances of being seen.     [facility] [phone] Select option 1 for emergencies   Location: [facility], [place], [address], [place] Clinic Hours: No walk-ins accepted - call the day before to schedule an appointment. Check in times are 9:30 am and 1:30 pm. Services: Simple extractions, temporary fillings, pulpectomy/pulp debridement, uncomplicated abscess drainage. Payment Options: PAYMENT IS DUE AT THE TIME OF SERVICE.  Fee is usually $100-200, additional surgical procedures (e.g. abscess drainage) may be extra. Cash, checks, Visa/MasterCard accepted.  Can file Medicaid if patient is covered for dental - patient should call case worker to check. No discount for [facility] patients. Best way to get seen: MUST call the day before and get onto the schedule. Can usually be seen the next  1-2 days. No walk-ins accepted.     [facility] [phone]   Location: [facility], [address], [place] Clinic Hours: M, W, Th, F 8am or 1:30pm, Tues 9a or 1:30 - first come/first served. Services: Simple extractions, temporary fillings, uncomplicated abscess drainage.  You do not need to be an [facility] resident. Payment Options: PAYMENT IS DUE AT THE TIME OF SERVICE. Dental insurance, otherwise sliding scale - bring proof of income or support. Depending on income and treatment needed, cost is usually $50-200. Best way to get seen: Arrive early as it is first come/first served.     [facility] [phone]   Location: [address] Clinic Hours: Mon-Thu 8a-5p Services: Most basic dental services including extractions and fillings. Payment Options: PAYMENT IS DUE AT THE TIME OF SERVICE. Sliding scale, up to 50% off - bring proof if income or support. Medicaid with dental option accepted. Best way to get seen: Call to schedule an appointment, can usually be seen within 2 weeks OR they will try to see walk-ins - show up at 8a or 2p (you may have to wait).     [facility] [phone] [place] RESIDENTS ONLY   Location: [facility], [address] [place], [place] [address] Clinic Hours: By appointment only. Monday - Thursday 8am-5pm, Friday 8am-12pm Services: Cleanings, fillings, extractions. Payment Options: PAYMENT IS DUE AT THE TIME OF SERVICE. Cash, Visa or MasterCard. Sliding scale - $30 minimum per service. Best way to get seen: Come in to office, complete packet and make an appointment - need proof of income or support monies for each household member and proof of [facility] residence. Usually takes about a month to  get in.     [facility] [phone]   Location: [address]  [facility] Hours: Walk-in Urgent Care Dental Services are offered Monday-Friday mornings only. The numbers of emergencies accepted daily is limited to the number of providers available. Maximum 15 - Mondays, Wednesdays & Thursdays Maximum 10 - Tuesdays & Fridays Services: You do not need to be a [place] resident to be seen for a dental emergency. Emergencies are defined as pain, swelling, abnormal bleeding, or dental trauma. Walkins will receive x-rays if needed. NOTE: Dental cleaning is not an emergency. Payment Options: PAYMENT IS DUE AT THE TIME OF SERVICE. Minimum co-pay is $40.00 for uninsured patients. Minimum co-pay is $3.00 for Medicaid with dental coverage. Dental Insurance is accepted and must be presented at time of visit. Medicare does not cover dental. Forms of payment: Cash, credit card, checks. Best way to get seen: If not previously registered with the clinic, walk-in dental registration begins at 7:15 am and is on a first come/first serve basis. If previously registered with the clinic, call to make an appointment.     [facility] [phone] [place] RESIDENTS ONLY   Location: [address], [place], [place] Clinic Hours: Mon-Thu 10a-2p Services: Extractions only! Payment Options: FREE (donations accepted) - bring proof of income or support Best way to get seen: Call and schedule an appointment OR come at 8am on the 1st Monday of every month (except for holidays) when it is first come/first served.     [facility] [phone]   Location: [address], [place] Clinic Hours: Friday mornings Services, Payment Options, Best way to get seen: Call for info

## 2017-03-01 NOTE — ED Provider Notes (Signed)
[facility] Emergency Department Provider Note  ___________________________________________   First MD Initiated Contact with Patient 03/01/17 1330     (approximate)  I have reviewed the triage vital signs and the nursing notes.   HISTORY  Chief Complaint Dental Pain  HPI [name] is a 41 y.o. female is no complaint of dental pain. Patient states the second time she's been in the ED for pain in the same area. She has not made arrangements to see a dentist. She continues to have dental pain on the right lower side. She denies any fever or chills.She rates her pain as 7 out of 10.   Past Medical History:  Diagnosis Date  . Diabetes mellitus without complication (HCC)   . Hypertension   . Obesity     Patient Active Problem List   Diagnosis Date Noted  . Indication for care in labor and delivery, antepartum 11/11/2016  . 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 CESAREAN SECTION    . PERIPHERAL VASCULAR CATHETERIZATION N/A 03/29/2015   Procedure: PICC Line Insertion;  Surgeon: Annice NeedyJason S Dew, MD;  Location: [facility] INVASIVE CV LAB;  Service: Cardiovascular;  Laterality: N/A;  . 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 Authorizing Provider  ibuprofen (ADVIL,MOTRIN) 600 MG tablet Take 1 tablet (600 mg total) by mouth every 8 (eight) hours as needed. 03/01/17   Tommi RumpsSummers, Diasia Henken L, PA-C  insulin aspart (NOVOLOG) 100 UNIT/ML injection Inject into the skin 3 (three) times daily before meals.    [provider]  insulin regular (NOVOLIN R,HUMULIN R) 100 units/mL injection Inject into the skin 3 (three) times daily before meals.    [provider]  metoprolol tartrate (LOPRESSOR) 25 MG tablet Take 25 mg by mouth 2 (two) times daily.     [provider]  penicillin v potassium (VEETID) 500 MG tablet Take 1 tablet (500 mg total) by mouth 4 (four) times daily. 03/01/17   Tommi RumpsSummers, Odester Nilson L, PA-C  simvastatin (ZOCOR) 40 MG tablet Take 40 mg by mouth daily.    [provider]    Allergies Patient has no known allergies.  No family history on file.  Social History Social History  Substance Use Topics  . Smoking status: Current Every Day Smoker    Packs/day: 0.50    Types: Cigarettes  . Smokeless tobacco: Never Used  . Alcohol use No    Review of Systems Constitutional: No fever/chills ENT: Positive for dental pain. Cardiovascular: Denies chest pain. Respiratory: Denies shortness of breath. Neurological: Negative for headaches ____________________________________________   PHYSICAL EXAM:  VITAL SIGNS: ED Triage Vitals  Enc Vitals Group     BP 03/01/17 1327 128/71     Pulse Rate 03/01/17 1327 72     Resp 03/01/17 1327 18     Temp 03/01/17 1327 98.1 F (36.7 C)     Temp Source 03/01/17 1327 Oral     SpO2 03/01/17 1327 96 %     Weight 03/01/17 1328 258 lb (117 kg)     Height 03/01/17 1328 5\' 9"  (1.753 m)     Head Circumference --      Peak Flow --      Pain Score 03/01/17 1327 7     Pain Loc --  Pain Edu? --      Excl. in GC? --     Constitutional: Alert and oriented. Well appearing and in no acute distress. Eyes: Conjunctivae are normal.  Head: Atraumatic. Nose: No congestion/rhinnorhea. Mouth/Throat: Mucous membranes are moist.  Oropharynx non-erythematous. Right lower gum is tender but no actual abscess is seen.  Neck: No stridor.   Hematological/Lymphatic/Immunilogical: No cervical lymphadenopathy. Cardiovascular: Normal rate, regular rhythm. Grossly normal heart sounds.  Good peripheral circulation. Respiratory: Normal respiratory effort.  No retractions. Lungs CTAB. Neurologic:  Normal speech and language. No gross focal neurologic deficits are appreciated.  Psychiatric:  Mood and affect are normal. Speech and behavior are normal.  ____________________________________________   LABS (all labs ordered are listed, but only abnormal results are displayed)  Labs Reviewed - No data to display  PROCEDURES  Procedure(s) performed: None  Procedures  Critical Care performed: No  ____________________________________________   INITIAL IMPRESSION / ASSESSMENT AND PLAN / ED COURSE  Pertinent labs & imaging results that were available during my care of the patient were reviewed by me and considered in my medical decision making (see chart for details).  Patient was given prescription for pen VK 500 mg 4 times a day and ibuprofen. She was given a list of dental clinics in the area and encouraged to call these on Monday for an appointment.   ___________________________________________   FINAL CLINICAL IMPRESSION(S) / ED DIAGNOSES  Final diagnoses:  Pain, dental      NEW MEDICATIONS STARTED DURING THIS VISIT:  Discharge Medication List as of 03/01/2017  1:59 PM       Note:  This document was prepared using Dragon voice recognition software and may include unintentional dictation errors.    Tommi Rumps, PA-C 03/01/17 1436    Dionne Bucy, MD 03/01/17 [phone]

## 2017-03-01 NOTE — ED Triage Notes (Signed)
Patient presents to the ED with right lower jaw/dental pain.  Patient reports history of dental abscesses.  Patient reports she has not been able to go to the dentist due to financial reasons.  Patient appears uncomfortable.

## 2017-07-28 ENCOUNTER — Emergency Department
Admission: EM | Admit: 2017-07-28 | Discharge: 2017-07-28 | Disposition: A | Discharge status: Home / Self Care | Payer: Medicaid Other | Attending: Emergency Medicine | Admitting: Emergency Medicine

## 2017-07-28 ENCOUNTER — Encounter: Payer: Self-pay | Admitting: Emergency Medicine

## 2017-07-28 ENCOUNTER — Other Ambulatory Visit: Payer: Self-pay

## 2017-07-28 DIAGNOSIS — I10 Essential (primary) hypertension: Secondary | ICD-10-CM | POA: Insufficient documentation

## 2017-07-28 DIAGNOSIS — Z794 Long term (current) use of insulin: Secondary | ICD-10-CM | POA: Diagnosis not present

## 2017-07-28 DIAGNOSIS — K047 Periapical abscess without sinus: Secondary | ICD-10-CM | POA: Diagnosis not present

## 2017-07-28 DIAGNOSIS — F17210 Nicotine dependence, cigarettes, uncomplicated: Secondary | ICD-10-CM | POA: Insufficient documentation

## 2017-07-28 DIAGNOSIS — E109 Type 1 diabetes mellitus without complications: Secondary | ICD-10-CM | POA: Insufficient documentation

## 2017-07-28 DIAGNOSIS — Z79899 Other long term (current) drug therapy: Secondary | ICD-10-CM | POA: Insufficient documentation

## 2017-07-28 DIAGNOSIS — K0889 Other specified disorders of teeth and supporting structures: Secondary | ICD-10-CM | POA: Diagnosis present

## 2017-07-28 DIAGNOSIS — K029 Dental caries, unspecified: Secondary | ICD-10-CM

## 2017-07-28 MED ORDER — PENICILLIN V POTASSIUM 500 MG PO TABS: 500.0000 mg | ORAL_TABLET | Freq: Four times a day (QID) | ORAL | 0 refills | Status: DC

## 2017-07-28 MED ORDER — ACETAMINOPHEN-CODEINE 300-30 MG PO TABS: 1.0000 | ORAL_TABLET | Freq: Four times a day (QID) | ORAL | 0 refills | Status: DC | PRN

## 2017-07-28 MED ORDER — TRAMADOL HCL 50 MG PO TABS
50.0000 mg | ORAL_TABLET | Freq: Once | ORAL | Status: AC
  Administered 2017-07-28: 50 mg via ORAL
  Filled 2017-07-28: qty 1

## 2017-07-28 NOTE — ED Triage Notes (Signed)
Patient ambulatory to triage with steady gait, without difficulty or distress noted; pt reports right lower toothache since yesterday

## 2017-07-28 NOTE — ED Provider Notes (Signed)
[facility] Emergency Department Provider Note  ____________________________________________   First MD Initiated Contact with Patient 07/28/17 1951     (approximate)  I have reviewed the triage vital signs and the nursing notes.   HISTORY  Chief Complaint Dental Pain  HPI Essie ChristineLisa M Muska is a 42 y.o. female is here with complaint of right lower dental pain that started yesterday.  Patient denies any fever or chills.  She has not taken any over-the-counter medication for her pain.  Currently she rates her pain as 10/10.  Past Medical History:  Diagnosis Date  . Diabetes mellitus without complication (HCC)   . Hypertension   . Obesity     Patient Active Problem List   Diagnosis Date Noted  . Indication for care in labor and delivery, antepartum 11/11/2016  . 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 CESAREAN SECTION    . PERIPHERAL VASCULAR CATHETERIZATION N/A 03/29/2015   Procedure: PICC Line Insertion;  Surgeon: Annice NeedyJason S Dew, MD;  Location: [facility] INVASIVE CV LAB;  Service: Cardiovascular;  Laterality: N/A;  . 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 Authorizing Provider  Acetaminophen-Codeine (TYLENOL/CODEINE #3) 300-30 MG tablet Take 1 tablet by mouth every 6 (six) hours as needed for pain. 07/28/17   Tommi RumpsSummers, Rhonda L, PA-C  insulin aspart (NOVOLOG) 100 UNIT/ML injection Inject into the skin 3 (three) times daily before meals.    [provider]  insulin regular (NOVOLIN R,HUMULIN R) 100 units/mL injection Inject into the skin 3 (three) times daily before meals.    [provider]  metoprolol tartrate (LOPRESSOR) 25 MG tablet Take 25 mg by mouth 2 (two) times daily.    [provider]  penicillin v potassium (VEETID)  500 MG tablet Take 1 tablet (500 mg total) by mouth 4 (four) times daily. 07/28/17   Tommi RumpsSummers, Rhonda L, PA-C  simvastatin (ZOCOR) 40 MG tablet Take 40 mg by mouth daily.    [provider]    Allergies Patient has no known allergies.  No family history on file.  Social History Social History   Tobacco Use  . Smoking status: Current Every Day Smoker    Packs/day: 0.50    Types: Cigarettes  . Smokeless tobacco: Never Used  Substance Use Topics  . Alcohol use: No  . Drug use: No    Review of Systems Constitutional: No fever/chills Eyes: No visual changes. ENT: Positive for dental pain. Cardiovascular: Denies chest pain. Respiratory: Denies shortness of breath. Gastrointestinal:  No nausea, no vomiting. Musculoskeletal: Negative for back pain. Skin: Negative for rash. Neurological: Negative for headaches ___________________________________________   PHYSICAL EXAM:  VITAL SIGNS: ED Triage Vitals  Enc Vitals Group     BP 07/28/17 1920 (!) 161/85     Pulse Rate 07/28/17 1920 (!) 56     Resp 07/28/17 1920 18     Temp 07/28/17 1920 97.7 F (36.5 C)     Temp Source 07/28/17 1920 Oral     SpO2 07/28/17 1920 97 %     Weight 07/28/17 1919 247 lb (112 kg)     Height 07/28/17 1919 5\' 9"  (1.753 m)     Head Circumference --      Peak Flow --  Pain Score 07/28/17 1919 10     Pain Loc --      Pain Edu? --      Excl. in GC? --    Constitutional: Alert and oriented. Well appearing and in no acute distress. Eyes: Conjunctivae are normal.  Head: Atraumatic. Mouth/Throat: Mucous membranes are moist.  Oropharynx non-erythematous.  Right lower premolar huge cavity is present which is broken off at the gumline.  There is a papule to the lateral aspect of this which looks like a small fistula to this area without active drainage. Neck: No stridor.   Hematological/Lymphatic/Immunilogical: No cervical lymphadenopathy. Cardiovascular: Normal rate, regular rhythm. Grossly  normal heart sounds.  Good peripheral circulation. Respiratory: Normal respiratory effort.  No retractions. Lungs CTAB. Musculoskeletal: His upper and lower extremities without any difficulty.  Normal gait was noted. Neurologic:  Normal speech and language. No gross focal neurologic deficits are appreciated.  Skin:  Skin is warm, dry and intact. No rash noted. Psychiatric: Mood and affect are normal. Speech and behavior are normal.  ____________________________________________   LABS (all labs ordered are listed, but only abnormal results are displayed)  Labs Reviewed - No data to display   PROCEDURES  Procedure(s) performed: None  Procedures  Critical Care performed: No  ____________________________________________   INITIAL IMPRESSION / ASSESSMENT AND PLAN / ED COURSE She was given a prescription for Pen-Vee K and a prescription for Tylenol 3..  Patient was also given 1 tramadol while in the emergency department since she has not eaten anything today.  She is strongly encouraged to follow-up with a dentist and a list of clinics was given to her and her discharge papers.   ____________________________________________   FINAL CLINICAL IMPRESSION(S) / ED DIAGNOSES  Final diagnoses:  Pain due to dental caries  Dental abscess     ED Discharge Orders        Ordered    penicillin v potassium (VEETID) 500 MG tablet  4 times daily     07/28/17 2005    Acetaminophen-Codeine (TYLENOL/CODEINE #3) 300-30 MG tablet  Every 6 hours PRN     07/28/17 2005       Note:  This document was prepared using Dragon voice recognition software and may include unintentional dictation errors.    Tommi Rumps, PA-C 07/28/17 2027    Phineas Semen, MD 07/28/17 2055

## 2017-07-28 NOTE — Discharge Instructions (Signed)
Begin taking antibiotics as directed.  Take pain medication only as directed and do not drive or operate machinery while taking it.  Contact 1 of the dental clinics listed on your discharge papers.  Also [name] will take walk-ins and extra information is given to you about this.  OPTIONS FOR DENTAL FOLLOW UP CARE  [place] Department of Health and Human Services - Local Safety Net Dental Clinics TripDoors.comhttp://www.ncdhhs.gov/dph/oralhealth/services/safetynetclinics.htm   [name] [phone]  [facility] [phone])  [facility] [phone])  [facility] [phone])  [facility] [phone]) This clinic caters to the indigent population and is on a lottery system. Location: [employer], [address], [name] [facility] Hours: Wednesdays from 6pm - 9pm, patients seen by a lottery system. For dates, call or go to [email or web address] Services: Cleanings, fillings and simple extractions. Payment Options: DENTAL WORK IS FREE OF CHARGE. Bring proof of income or support. Best way to get seen: Arrive at 5:15 pm - this is a lottery, NOT first come/first serve, so arriving earlier will not increase your chances of being seen.     [facility] [phone] Select option 1 for emergencies   Location: [facility], [address], [name] [facility] Hours: No walk-ins accepted - call the day before to schedule an appointment. Check in times are 9:30 am and 1:30 pm. Services: Simple extractions, temporary fillings, pulpectomy/pulp debridement, uncomplicated abscess drainage. Payment Options: PAYMENT IS DUE AT THE TIME OF SERVICE.  Fee is usually $100-200, additional surgical procedures (e.g. abscess drainage) may be extra. Cash, checks, Visa/MasterCard accepted.  Can file Medicaid if patient is covered for  dental - patient should call case worker to check. No discount for [facility] patients. Best way to get seen: MUST call the day before and get onto the schedule. Can usually be seen the next 1-2 days. No walk-ins accepted.     [facility] [phone]   Location: [facility], [address], [place] [facility] Hours: M, W, Th, F 8am or 1:30pm, Tues 9a or 1:30 - first come/first served. Services: Simple extractions, temporary fillings, uncomplicated abscess drainage.  You do not need to be an [place] [place] [place] [place] resident. Payment Options: PAYMENT IS DUE AT THE TIME OF SERVICE. Dental insurance, otherwise sliding scale - bring proof of income or support. Depending on income and treatment needed, cost is usually $50-200. Best way to get seen: Arrive early as it is first come/first served.     [facility] [phone]   Location: [address] Clinic Hours: Mon-Thu 8a-5p Services: Most basic dental services including extractions and fillings. Payment Options: PAYMENT IS DUE AT THE TIME OF SERVICE. Sliding scale, up to 50% off - bring proof if income or support. Medicaid with dental option accepted. Best way to get seen: Call to schedule an appointment, can usually be seen within 2 weeks OR they will try to see walk-ins - show up at 8a or 2p (you may have to wait).     [facility] [phone] [place] RESIDENTS ONLY   Location: [facility], [address], [place], [place] [address] Clinic Hours: By appointment only. Monday - Thursday 8am-5pm, Friday 8am-12pm Services: Cleanings, fillings, extractions. Payment Options: PAYMENT IS DUE AT THE TIME OF SERVICE. Cash, Visa or MasterCard. Sliding scale - $30 minimum per service. Best way to get seen: Come  in to office, complete packet and make an appointment - need proof of income or support monies for each  household member and proof of [place] residence. Usually takes about a month to get in.     [facility] [phone]   Location: [address] [name] Hours: Walk-in Urgent Care Dental Services are offered Monday-Friday mornings only. The numbers of emergencies accepted daily is limited to the number of providers available. Maximum 15 - Mondays, Wednesdays & Thursdays Maximum 10 - Tuesdays & Fridays Services: You do not need to be a [place] resident to be seen for a dental emergency. Emergencies are defined as pain, swelling, abnormal bleeding, or dental trauma. Walkins will receive x-rays if needed. NOTE: Dental cleaning is not an emergency. Payment Options: PAYMENT IS DUE AT THE TIME OF SERVICE. Minimum co-pay is $40.00 for uninsured patients. Minimum co-pay is $3.00 for Medicaid with dental coverage. Dental Insurance is accepted and must be presented at time of visit. Medicare does not cover dental. Forms of payment: Cash, credit card, checks. Best way to get seen: If not previously registered with the clinic, walk-in dental registration begins at 7:15 am and is on a first come/first serve basis. If previously registered with the clinic, call to make an appointment.     [facility] [phone] [place] RESIDENTS ONLY   Location: [address], [place], [place] Clinic Hours: Mon-Thu 10a-2p Services: Extractions only! Payment Options: FREE (donations accepted) - bring proof of income or support Best way to get seen: Call and schedule an appointment OR come at 8am on the 1st Monday of every month (except for holidays) when it is first come/first served.     [facility] [phone]   Location: [address], [place] [facility] Hours: Friday mornings Services, Payment Options, Best way to get seen: Call for info

## 2018-03-14 ENCOUNTER — Other Ambulatory Visit: Payer: Self-pay

## 2018-03-14 ENCOUNTER — Emergency Department
Admission: EM | Admit: 2018-03-14 | Discharge: 2018-03-14 | Disposition: A | Discharge status: Home / Self Care | Payer: Medicaid Other | Attending: Emergency Medicine | Admitting: Emergency Medicine

## 2018-03-14 DIAGNOSIS — K047 Periapical abscess without sinus: Secondary | ICD-10-CM

## 2018-03-14 DIAGNOSIS — E109 Type 1 diabetes mellitus without complications: Secondary | ICD-10-CM | POA: Insufficient documentation

## 2018-03-14 DIAGNOSIS — F17210 Nicotine dependence, cigarettes, uncomplicated: Secondary | ICD-10-CM | POA: Insufficient documentation

## 2018-03-14 DIAGNOSIS — I10 Essential (primary) hypertension: Secondary | ICD-10-CM | POA: Insufficient documentation

## 2018-03-14 MED ORDER — NAPROXEN 500 MG PO TABS
500.0000 mg | ORAL_TABLET | Freq: Once | ORAL | Status: AC
  Administered 2018-03-14: 500 mg via ORAL
  Filled 2018-03-14: qty 1

## 2018-03-14 MED ORDER — NAPROXEN 500 MG PO TABS: 500.0000 mg | ORAL_TABLET | Freq: Two times a day (BID) | ORAL | 0 refills | Status: DC

## 2018-03-14 MED ORDER — AMOXICILLIN 500 MG PO CAPS
500.0000 mg | ORAL_CAPSULE | Freq: Once | ORAL | Status: AC
  Administered 2018-03-14: 500 mg via ORAL
  Filled 2018-03-14: qty 1

## 2018-03-14 MED ORDER — AMOXICILLIN 500 MG PO TABS: 500.0000 mg | ORAL_TABLET | Freq: Three times a day (TID) | ORAL | 0 refills | Status: DC

## 2018-03-14 NOTE — ED Provider Notes (Signed)
[facility] Emergency Department Provider Note ____________________________________________  Time seen: Approximately 7:23 PM  I have reviewed the triage vital signs and the nursing notes.   HISTORY  Chief Complaint Dental Pain   HPI [name] is a 42 y.o. female who presents to the emergency department for treatment and evaluation of dental pain x 2 days. Swelling started today. Some relief with ibuprofen. She denies fever.   Past Medical History:  Diagnosis Date  . Diabetes mellitus without complication (HCC)   . Hypertension   . Obesity     Patient Active Problem List   Diagnosis Date Noted  . Indication for care in labor and delivery, antepartum 11/11/2016  . 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 CESAREAN SECTION    . PERIPHERAL VASCULAR CATHETERIZATION N/A 03/29/2015   Procedure: PICC Line Insertion;  Surgeon: Annice NeedyJason S Dew, MD;  Location: [facility] INVASIVE CV LAB;  Service: Cardiovascular;  Laterality: N/A;  . 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 Authorizing Provider  Acetaminophen-Codeine (TYLENOL/CODEINE #3) 300-30 MG tablet Take 1 tablet by mouth every 6 (six) hours as needed for pain. 07/28/17   Tommi RumpsSummers, Rhonda L, PA-C  amoxicillin (AMOXIL) 500 MG tablet Take 1 tablet (500 mg total) by mouth 3 (three) times daily. 03/14/18   Lutie Pickler B, FNP  insulin aspart (NOVOLOG) 100 UNIT/ML injection Inject into the skin 3 (three) times daily before meals.    [provider]  insulin regular (NOVOLIN R,HUMULIN R) 100 units/mL injection Inject into the skin 3 (three) times daily before meals.    [provider]  metoprolol tartrate (LOPRESSOR) 25 MG tablet Take 25 mg by mouth 2 (two) times daily.    [provider]    naproxen (NAPROSYN) 500 MG tablet Take 1 tablet (500 mg total) by mouth 2 (two) times daily with a meal. 03/14/18   Jontue Crumpacker B, FNP  penicillin v potassium (VEETID) 500 MG tablet Take 1 tablet (500 mg total) by mouth 4 (four) times daily. 07/28/17   Tommi RumpsSummers, Rhonda L, PA-C  simvastatin (ZOCOR) 40 MG tablet Take 40 mg by mouth daily.    [provider]    Allergies Patient has no known allergies.  No family history on file.  Social History Social History   Tobacco Use  . Smoking status: Current Every Day Smoker    Packs/day: 0.50    Types: Cigarettes  . Smokeless tobacco: Never Used  Substance Use Topics  . Alcohol use: No  . Drug use: No    Review of Systems Constitutional: Negative for fever or recent illness. ENT: Positive for dental pain. Musculoskeletal: Negative for trismus of the jaw.  Skin: Negative for wound or lesion. ____________________________________________   PHYSICAL EXAM:  VITAL SIGNS: ED Triage Vitals  Enc Vitals Group     BP 03/14/18 1908 127/70     Pulse Rate 03/14/18 1908 79     Resp 03/14/18 1908 17     Temp 03/14/18 1908 98.2 F (36.8 C)     Temp Source 03/14/18 1908 Oral     SpO2 03/14/18 1908 98 %     Weight 03/14/18 1906 244 lb (110.7 kg)     Height 03/14/18 1906 5\' 9"  (1.753 m)  Head Circumference --      Peak Flow --      Pain Score 03/14/18 1906 8     Pain Loc --      Pain Edu? --      Excl. in GC? --     Constitutional: Alert and oriented. Well appearing and in no acute distress. Eyes: Conjunctiva are clear without discharge or drainage. Mouth/Throat: Patent Periodontal Exam    Hematological/Lymphatic/Immunilogical: No palpable adenopathy. Respiratory: Respirations even and unlabored. Musculoskeletal: Full ROM of the jaw. Neurologic: Awake, alert, oriented.  Skin:  Mild facial edema corresponding with chronic appearing dental fractures and surrounding gingival erythema and edema. Psychiatric: Affect and  behavior intact.  ____________________________________________   LABS (all labs ordered are listed, but only abnormal results are displayed)  Labs Reviewed - No data to display ____________________________________________   RADIOLOGY  Not indicated. ____________________________________________   PROCEDURES  Procedure(s) performed:   Procedures  Critical Care performed: No ____________________________________________   INITIAL IMPRESSION / ASSESSMENT AND PLAN / ED COURSE  [name] is a 42 y.o. female who presents to the ER for treatment of dental pain. She will receive Amoxicillin and Naprosyn while here and receive prescriptions for the same. She will be provided a list of community resources for dental clinics. She is to call tomorrow for an appointment. She is to return to the ER for symptoms that change or worsen if unable to schedule an appointment.  Pertinent labs & imaging results that were available during my care of the patient were reviewed by me and considered in my medical decision making (see chart for details).  ____________________________________________   FINAL CLINICAL IMPRESSION(S) / ED DIAGNOSES  Final diagnoses:  Dental abscess    New Prescriptions   AMOXICILLIN (AMOXIL) 500 MG TABLET    Take 1 tablet (500 mg total) by mouth 3 (three) times daily.   NAPROXEN (NAPROSYN) 500 MG TABLET    Take 1 tablet (500 mg total) by mouth 2 (two) times daily with a meal.    If controlled substance prescribed during this visit, 12 month history viewed on the NCCSRS prior to issuing an initial prescription for Schedule II or III opiod.  Note:  This document was prepared using Dragon voice recognition software and may include unintentional dictation errors.     Chinita Pester, FNP 03/14/18 1931    Sharman Cheek, MD 03/14/18 [phone]

## 2018-03-14 NOTE — Discharge Instructions (Signed)
Please call and schedule a dental appointment as soon as possible. You will need to be seen within the next 14 days. Return to the emergency department for symptoms that change or worsen if you're unable to schedule an appointment.  OPTIONS FOR DENTAL FOLLOW UP CARE  [place] Department of Health and Human Services - Local Safety Net Dental Clinics http://www.ncdhhs.gov/dph/oralhealth/services/safetynetclinics.htm   [facility] ([phone])  [facility] ([phone])  [facility] ([phone])  [facility] ([phone])  [facility] ([phone]) This clinic caters to the indigent population and is on a lottery system. Location: [employer], [address], [place] Clinic Hours: Wednesdays from 6pm - 9pm, patients seen by a lottery system. For dates, call or go to [email or web address] Services: Cleanings, fillings and simple extractions. Payment Options: DENTAL WORK IS FREE OF CHARGE. Bring proof of income or support. Best way to get seen: Arrive at 5:15 pm - this is a lottery, NOT first come/first serve, so arriving earlier will not increase your chances of being seen.     [facility] [phone] Select option 1 for emergencies   Location: [employer], [address], [place] Clinic Hours: No walk-ins accepted - call the day before to schedule an appointment. Check in times are 9:30 am and 1:30 pm. Services: Simple extractions, temporary fillings, pulpectomy/pulp debridement, uncomplicated abscess drainage. Payment Options: PAYMENT IS DUE AT THE TIME OF SERVICE.  Fee is usually $100-200, additional surgical procedures (e.g. abscess drainage) may be extra. Cash, checks, Visa/MasterCard accepted.  Can file Medicaid if patient is covered for dental - patient should call case worker to check. No  discount for [employer] patients. Best way to get seen: MUST call the day before and get onto the schedule. Can usually be seen the next 1-2 days. No walk-ins accepted.     [facility] [phone]   Location: [facility], [address], [place] [facility] Hours: M, W, Th, F 8am or 1:30pm, Tues 9a or 1:30 - first come/first served. Services: Simple extractions, temporary fillings, uncomplicated abscess drainage.  You do not need to be an [place] resident. Payment Options: PAYMENT IS DUE AT THE TIME OF SERVICE. Dental insurance, otherwise sliding scale - bring proof of income or support. Depending on income and treatment needed, cost is usually $50-200. Best way to get seen: Arrive early as it is first come/first served.     [facility] [phone]   Location: [address] Clinic Hours: Mon-Thu 8a-5p Services: Most basic dental services including extractions and fillings. Payment Options: PAYMENT IS DUE AT THE TIME OF SERVICE. Sliding scale, up to 50% off - bring proof if income or support. Medicaid with dental option accepted. Best way to get seen: Call to schedule an appointment, can usually be seen within 2 weeks OR they will try to see walk-ins - show up at 8a or 2p (you may have to wait).     [facility] [phone] [place]IDENTS ONLY   Location: [facility], [address], [place], [place] [address] Clinic Hours: By appointment only. Monday - Thursday 8am-5pm, Friday 8am-12pm Services: Cleanings, fillings, extractions. Payment Options: PAYMENT IS DUE AT THE TIME OF SERVICE. Cash, Visa or MasterCard. Sliding scale - $30 minimum per service. Best way to get seen: Come in to office, complete packet and make an appointment -   need proof of income or support monies for each household member and proof of [place]  residence. Usually takes about a month to get in.     [facility] [phone]   Location: [address] [place] Clinic Hours: Walk-in Urgent Care Dental Services are offered Monday-Friday mornings only. The numbers of emergencies accepted daily is limited to the number of providers available. Maximum 15 - Mondays, Wednesdays & Thursdays Maximum 10 - Tuesdays & Fridays Services: You do not need to be a [place] resident to be seen for a dental emergency. Emergencies are defined as pain, swelling, abnormal bleeding, or dental trauma. Walkins will receive x-rays if needed. NOTE: Dental cleaning is not an emergency. Payment Options: PAYMENT IS DUE AT THE TIME OF SERVICE. Minimum co-pay is $40.00 for uninsured patients. Minimum co-pay is $3.00 for Medicaid with dental coverage. Dental Insurance is accepted and must be presented at time of visit. Medicare does not cover dental. Forms of payment: Cash, credit card, checks. Best way to get seen: If not previously registered with the clinic, walk-in dental registration begins at 7:15 am and is on a first come/first serve basis. If previously registered with the clinic, call to make an appointment.     [facility] [phone] [place] RESIDENTS ONLY   Location: [address], [place], [place] Clinic Hours: Mon-Thu 10a-2p Services: Extractions only! Payment Options: FREE (donations accepted) - bring proof of income or support Best way to get seen: Call and schedule an appointment OR come at 8am on the 1st Monday of every month (except for holidays) when it is first come/first served.     [facility] [phone]   Location: [address], [place] Clinic Hours: Friday mornings Services, Payment Options, Best way to get seen: Call for info  

## 2018-03-14 NOTE — ED Triage Notes (Signed)
Pt arrives to ED via POV from home with c/o right lower dental pain x2 days. Pt states previous h/x of dental issues, and states she might have abcess. Pt denies fever, no CP or SHOB.

## 2018-03-14 NOTE — ED Notes (Signed)
Pt to the er for swelling and tooth pain to the right side lower face. Pt has 2 broken teeth to the bottom right side of the face.

## 2018-04-07 ENCOUNTER — Encounter: Payer: Self-pay | Admitting: Emergency Medicine

## 2018-04-07 ENCOUNTER — Emergency Department
Admission: EM | Admit: 2018-04-07 | Discharge: 2018-04-07 | Disposition: A | Discharge status: Home / Self Care | Payer: Medicaid Other | Attending: Emergency Medicine | Admitting: Emergency Medicine

## 2018-04-07 DIAGNOSIS — R69 Illness, unspecified: Secondary | ICD-10-CM

## 2018-04-07 DIAGNOSIS — F17210 Nicotine dependence, cigarettes, uncomplicated: Secondary | ICD-10-CM | POA: Insufficient documentation

## 2018-04-07 DIAGNOSIS — Z79899 Other long term (current) drug therapy: Secondary | ICD-10-CM | POA: Insufficient documentation

## 2018-04-07 DIAGNOSIS — E109 Type 1 diabetes mellitus without complications: Secondary | ICD-10-CM | POA: Insufficient documentation

## 2018-04-07 DIAGNOSIS — J111 Influenza due to unidentified influenza virus with other respiratory manifestations: Secondary | ICD-10-CM | POA: Insufficient documentation

## 2018-04-07 DIAGNOSIS — I10 Essential (primary) hypertension: Secondary | ICD-10-CM | POA: Insufficient documentation

## 2018-04-07 DIAGNOSIS — Z794 Long term (current) use of insulin: Secondary | ICD-10-CM | POA: Insufficient documentation

## 2018-04-07 LAB — INFLUENZA PANEL BY PCR (TYPE A & B)
INFLBPCR: NEGATIVE
Influenza A By PCR: NEGATIVE

## 2018-04-07 MED ORDER — FLUTICASONE PROPIONATE 50 MCG/ACT NA SUSP: 2.0000 | Freq: Every day | NASAL | 0 refills | Status: DC

## 2018-04-07 MED ORDER — PSEUDOEPH-BROMPHEN-DM 30-2-10 MG/5ML PO SYRP: 5.0000 mL | ORAL_SOLUTION | Freq: Four times a day (QID) | ORAL | 0 refills | Status: DC | PRN

## 2018-04-07 MED ORDER — SODIUM CHLORIDE 0.9 % IV BOLUS
1000.0000 mL | Freq: Once | INTRAVENOUS | Status: AC
  Administered 2018-04-07: 1000 mL via INTRAVENOUS

## 2018-04-07 NOTE — ED Provider Notes (Signed)
[facility] Emergency Department Provider Note  ____________________________________________  Time seen: Approximately 10:57 AM  I have reviewed the triage vital signs and the nursing notes.   HISTORY  Chief Complaint Influenza; Nasal Congestion; and Sore Throat    HPI [name] is a 42 y.o. female that presents to the emergency department for evaluation of chills, nasal congestion, sore throat, nonproductive cough, diarrhea, body aches for 1 day.  Patient states that she has had several episodes of diarrhea that started this morning.  No blood or mucus in diarrhea. She has been drinking water but is concerned for dehydration with the diarrhea.  Patient states that a coworker has pneumonia but no additional sick contacts.  No alleviating measures have been attempted.  She denies shortness of breath, chest pain, nausea, vomiting, abdominal pain.  Past Medical History:  Diagnosis Date  . Diabetes mellitus without complication (HCC)   . Hypertension   . Obesity     Patient Active Problem List   Diagnosis Date Noted  . Indication for care in labor and delivery, antepartum 11/11/2016  . 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 CESAREAN SECTION    . PERIPHERAL VASCULAR CATHETERIZATION N/A 03/29/2015   Procedure: PICC Line Insertion;  Surgeon: Annice Needy, MD;  Location: [facility] INVASIVE CV LAB;  Service: Cardiovascular;  Laterality: N/A;  . 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 Authorizing Provider  Acetaminophen-Codeine (TYLENOL/CODEINE #3) 300-30 MG tablet Take 1 tablet by mouth every 6 (six) hours as needed for pain. 07/28/17   Tommi Rumps, PA-C  amoxicillin (AMOXIL) 500 MG tablet Take 1 tablet (500 mg total) by mouth 3 (three) times daily.  03/14/18   Triplett, Rulon Eisenmenger B, FNP  brompheniramine-pseudoephedrine-DM 30-2-10 MG/5ML syrup Take 5 mLs by mouth 4 (four) times daily as needed. 04/07/18   Enid Derry, PA-C  fluticasone (FLONASE) 50 MCG/ACT nasal spray Place 2 sprays into both nostrils daily. 04/07/18 04/07/19  Enid Derry, PA-C  insulin aspart (NOVOLOG) 100 UNIT/ML injection Inject into the skin 3 (three) times daily before meals.    [provider]  insulin regular (NOVOLIN R,HUMULIN R) 100 units/mL injection Inject into the skin 3 (three) times daily before meals.    [provider]  metoprolol tartrate (LOPRESSOR) 25 MG tablet Take 25 mg by mouth 2 (two) times daily.    [provider]  naproxen (NAPROSYN) 500 MG tablet Take 1 tablet (500 mg total) by mouth 2 (two) times daily with a meal. 03/14/18   Triplett, Cari B, FNP  penicillin v potassium (VEETID) 500 MG tablet Take 1 tablet (500 mg total) by mouth 4 (four) times daily. 07/28/17   Tommi Rumps, PA-C  simvastatin (ZOCOR) 40 MG tablet Take 40 mg by mouth daily.    [provider]    Allergies Patient has no known allergies.  No family history on file.  Social History Social History   Tobacco Use  . Smoking status: Current Every Day Smoker    Packs/day: 0.50    Types: Cigarettes  . Smokeless tobacco: Never Used  Substance Use Topics  . Alcohol use: No  . Drug use: No     Review of Systems  Constitutional: No fever/chills Eyes: No visual changes. No discharge.  ENT: Positive for congestion and rhinorrhea. Cardiovascular: No chest pain. Respiratory: Positive for cough. No SOB. Gastrointestinal: No abdominal pain.  No nausea, no vomiting.  Positive for diarrhea.  No constipation. Musculoskeletal: Negative for musculoskeletal pain. Skin: Negative for rash, abrasions, lacerations, ecchymosis. Neurological: Negative for headaches.   ____________________________________________   PHYSICAL EXAM:  VITAL SIGNS: ED  Triage Vitals  Enc Vitals Group     BP 04/07/18 1029 (!) 148/88     Pulse Rate 04/07/18 1029 76     Resp 04/07/18 1029 20     Temp 04/07/18 1029 98.4 F (36.9 C)     Temp Source 04/07/18 1029 Oral     SpO2 04/07/18 1029 98 %     Weight 04/07/18 1030 250 lb (113.4 kg)     Height 04/07/18 1030 5\' 9"  (1.753 m)     Head Circumference --      Peak Flow --      Pain Score 04/07/18 1030 10     Pain Loc --      Pain Edu? --      Excl. in GC? --      Constitutional: Alert and oriented. Well appearing and in no acute distress. Eyes: Conjunctivae are normal. PERRL. EOMI. No discharge. Head: Atraumatic. ENT: No frontal and maxillary sinus tenderness.      Ears: Tympanic membranes pearly gray with good landmarks. No discharge.      Nose: Moderate congestion/rhinnorhea.      Mouth/Throat: Mucous membranes are moist. Oropharynx non-erythematous. Tonsils not enlarged. No exudates. Uvula midline. Neck: No stridor.   Hematological/Lymphatic/Immunilogical: No cervical lymphadenopathy. Cardiovascular: Normal rate, regular rhythm.  Good peripheral circulation. Respiratory: Normal respiratory effort without tachypnea or retractions. Lungs CTAB. Good air entry to the bases with no decreased or absent breath sounds. Gastrointestinal: Bowel sounds 4 quadrants. Soft and nontender to palpation. No guarding or rigidity. No palpable masses. No distention. Musculoskeletal: Full range of motion to all extremities. No gross deformities appreciated. Neurologic:  Normal speech and language. No gross focal neurologic deficits are appreciated.  Skin:  Skin is warm, dry and intact. No rash noted. Psychiatric: Mood and affect are normal. Speech and behavior are normal. Patient exhibits appropriate insight and judgement.   ____________________________________________   LABS (all labs ordered are listed, but only abnormal results are displayed)  Labs Reviewed  INFLUENZA PANEL BY PCR (TYPE A & B)    ____________________________________________  EKG   ____________________________________________  RADIOLOGY   No results found.  ____________________________________________    PROCEDURES  Procedure(s) performed:    Procedures    Medications  sodium chloride 0.9 % bolus 1,000 mL (0 mLs Intravenous Stopped 04/07/18 1236)     ____________________________________________   INITIAL IMPRESSION / ASSESSMENT AND PLAN / ED COURSE  Pertinent labs & imaging results that were available during my care of the patient were reviewed by me and considered in my medical decision making (see chart for details).  Review of the [place] CSRS was performed in accordance of the [facility] prior to dispensing any controlled drugs.     Patient's diagnosis is consistent with influenza-like illness. Vital signs and exam are reassuring. Patient does not appear dehydrated. IV fluids were given in the ED per her request.  Patient feels comfortable going home. Patient will be discharged home with prescriptions for Bromfed, Flonase. Patient is to follow up with primary care as needed or otherwise directed. Patient is given ED precautions to return to the ED for any worsening or new symptoms.  ____________________________________________  FINAL CLINICAL IMPRESSION(S) / ED DIAGNOSES  Final diagnoses:  Influenza-like illness      NEW MEDICATIONS STARTED DURING THIS VISIT:  ED Discharge Orders         Ordered    brompheniramine-pseudoephedrine-DM 30-2-10 MG/5ML syrup  4 times daily PRN     04/07/18 1243    fluticasone (FLONASE) 50 MCG/ACT nasal spray  Daily,   Status:  Discontinued     04/07/18 1243    fluticasone (FLONASE) 50 MCG/ACT nasal spray  Daily     04/07/18 1246              This chart was dictated using voice recognition software/Dragon. Despite best efforts to proofread, errors can occur which can change the meaning. Any change was purely unintentional.    Enid Derry, PA-C 04/07/18 1744    Emily Filbert, MD 04/10/18 [phone]

## 2018-04-07 NOTE — ED Notes (Signed)
ED Provider at bedside. 

## 2018-04-07 NOTE — ED Triage Notes (Signed)
Pt reports started with flu like sx's yesterday. Pt c/o sore throat, bodyaches, nasal congestion and not feeling well. Pt reports took tylenol this am for fever.

## 2018-04-07 NOTE — Discharge Instructions (Signed)
Your symptoms are consistent with a viral illness.  Please follow-up with primary care in 2 days for reevaluation.  Please return to the emergency department for worsening symptoms or any symptoms concerning to you, including high fever, productive cough, shortness of breath, chest pain, vomiting, abdominal pain.

## 2018-08-01 ENCOUNTER — Emergency Department
Admission: EM | Admit: 2018-08-01 | Discharge: 2018-08-01 | Disposition: A | Discharge status: Home / Self Care | Payer: Medicaid Other | Attending: Emergency Medicine | Admitting: Emergency Medicine

## 2018-08-01 ENCOUNTER — Other Ambulatory Visit: Payer: Self-pay

## 2018-08-01 ENCOUNTER — Encounter: Payer: Self-pay | Admitting: Emergency Medicine

## 2018-08-01 DIAGNOSIS — Z794 Long term (current) use of insulin: Secondary | ICD-10-CM | POA: Insufficient documentation

## 2018-08-01 DIAGNOSIS — K047 Periapical abscess without sinus: Secondary | ICD-10-CM | POA: Insufficient documentation

## 2018-08-01 DIAGNOSIS — K029 Dental caries, unspecified: Secondary | ICD-10-CM | POA: Insufficient documentation

## 2018-08-01 DIAGNOSIS — I10 Essential (primary) hypertension: Secondary | ICD-10-CM | POA: Insufficient documentation

## 2018-08-01 DIAGNOSIS — E109 Type 1 diabetes mellitus without complications: Secondary | ICD-10-CM | POA: Insufficient documentation

## 2018-08-01 DIAGNOSIS — Z79899 Other long term (current) drug therapy: Secondary | ICD-10-CM | POA: Insufficient documentation

## 2018-08-01 DIAGNOSIS — F17210 Nicotine dependence, cigarettes, uncomplicated: Secondary | ICD-10-CM | POA: Insufficient documentation

## 2018-08-01 MED ORDER — HYDROCODONE-ACETAMINOPHEN 5-325 MG PO TABS
1.0000 | ORAL_TABLET | Freq: Once | ORAL | Status: AC
  Administered 2018-08-01: 1 via ORAL
  Filled 2018-08-01: qty 1

## 2018-08-01 MED ORDER — FLUCONAZOLE 150 MG PO TABS: 150.0000 mg | ORAL_TABLET | Freq: Every day | ORAL | 0 refills | Status: DC

## 2018-08-01 MED ORDER — PENICILLIN V POTASSIUM 500 MG PO TABS: 500.0000 mg | ORAL_TABLET | Freq: Four times a day (QID) | ORAL | 0 refills | Status: DC

## 2018-08-01 MED ORDER — ACETAMINOPHEN-CODEINE #3 300-30 MG PO TABS: 1.0000 | ORAL_TABLET | Freq: Four times a day (QID) | ORAL | 0 refills | Status: DC | PRN

## 2018-08-01 NOTE — ED Triage Notes (Signed)
Pt presents to ED via POV with c/o L sided facial pain, pt states "I got messed up teeth", denies seeing dentist due to lack of insurance.

## 2018-08-01 NOTE — ED Provider Notes (Signed)
[facility] Emergency Department Provider Note   ____________________________________________   First MD Initiated Contact with Patient 08/01/18 1434     (approximate)  I have reviewed the triage vital signs and the nursing notes.   HISTORY  Chief Complaint Dental Pain   HPI [name] is a 43 y.o. female presents to the ED with complaint of left-sided dental pain for 2 days.  Patient states that her teeth are "messed up".  Patient has been seen in the ED before for the same.  She states that she still has not followed up with a dentist.  She has been taking ibuprofen without any relief of her pain.   She rates her pain as a 10/10.   Past Medical History:  Diagnosis Date  . Diabetes mellitus without complication (HCC)   . Hypertension   . Obesity     Patient Active Problem List   Diagnosis Date Noted  . Indication for care in labor and delivery, antepartum 11/11/2016  . 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 CESAREAN SECTION    . PERIPHERAL VASCULAR CATHETERIZATION N/A 03/29/2015   Procedure: PICC Line Insertion;  Surgeon: Annice NeedyJason S Dew, MD;  Location: [facility] INVASIVE CV LAB;  Service: Cardiovascular;  Laterality: N/A;  . 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 Authorizing Provider  acetaminophen-codeine (TYLENOL #3) 300-30 MG tablet Take 1 tablet by mouth every 6 (six) hours as needed for moderate pain. 08/01/18   Tommi RumpsSummers, Rhonda L, PA-C  fluconazole (DIFLUCAN) 150 MG tablet Take 1 tablet (150 mg total) by mouth daily. 08/01/18   Tommi RumpsSummers, Rhonda L, PA-C  insulin aspart (NOVOLOG) 100 UNIT/ML injection Inject into the skin 3 (three) times daily before meals.    [provider]  insulin regular (NOVOLIN R,HUMULIN R) 100 units/mL  injection Inject into the skin 3 (three) times daily before meals.    [provider]  metoprolol tartrate (LOPRESSOR) 25 MG tablet Take 25 mg by mouth 2 (two) times daily.    [provider]  penicillin v potassium (VEETID) 500 MG tablet Take 1 tablet (500 mg total) by mouth 4 (four) times daily. 08/01/18   Tommi RumpsSummers, Rhonda L, PA-C  simvastatin (ZOCOR) 40 MG tablet Take 40 mg by mouth daily.    [provider]    Allergies Patient has no known allergies.  No family history on file.  Social History Social History   Tobacco Use  . Smoking status: Current Every Day Smoker    Packs/day: 0.50    Types: Cigarettes  . Smokeless tobacco: Never Used  Substance Use Topics  . Alcohol use: No  . Drug use: No    Review of Systems Constitutional: No fever/chills Eyes: No visual changes. ENT: No sore throat.  Positive for dental pain. Cardiovascular: Denies chest pain. Respiratory: Denies shortness of breath. Gastrointestinal: No abdominal pain.  No nausea, no vomiting.  Musculoskeletal: Negative for back pain. Neurological: Negative for headaches, focal weakness or numbness. ___________________________________________   PHYSICAL EXAM:  VITAL SIGNS: ED Triage Vitals [08/01/18 1332]  Enc Vitals Group     BP (!) 156/88     Pulse Rate 79     Resp 18     Temp 97.6 F (36.4 C)  Temp Source Oral     SpO2 98 %     Weight 254 lb (115.2 kg)     Height 5\' 9"  (1.753 m)     Head Circumference      Peak Flow      Pain Score 10     Pain Loc      Pain Edu?      Excl. in GC?     Constitutional: Alert and oriented. Well appearing and in no acute distress. Eyes: Conjunctivae are normal.  Head: Atraumatic. Nose: No congestion/rhinnorhea. Mouth/Throat: Mucous membranes are moist.  Oropharynx non-erythematous.  Left upper premolars and molars with moderate tenderness to the gum area with gums receding.  Left lower molar is broken off at the gum and brown in  color.  Gum is edematous but no pus is seen. Neck: No stridor.   Hematological/Lymphatic/Immunilogical: No cervical lymphadenopathy. Cardiovascular: Normal rate, regular rhythm. Grossly normal heart sounds.  Good peripheral circulation. Respiratory: Normal respiratory effort.  No retractions. Lungs CTAB. Musculoskeletal: Normal gait was noted. Neurologic:  Normal speech and language. No gross focal neurologic deficits are appreciated. No gait instability. Skin:  Skin is warm, dry and intact. No rash noted. Psychiatric: Mood and affect are normal. Speech and behavior are normal.  ____________________________________________   LABS (all labs ordered are listed, but only abnormal results are displayed)  Labs Reviewed - No data to display ____________________________________________  PROCEDURES  Procedure(s) performed: None  Procedures  Critical Care performed: No  ____________________________________________   INITIAL IMPRESSION / ASSESSMENT AND PLAN / ED COURSE  As part of my medical decision making, I reviewed the following data within the electronic MEDICAL RECORD NUMBER Notes from prior ED visits and [place] Controlled Substance Database  Patient presents to the ED with complaint of dental pain for the last 2 days.  Patient has taken over-the-counter medication without any relief of her pain.  She has been to the ED several times and has not followed up with a dentist.  Another dental list was given to her including information about the [facility].  Patient was discharged with a prescription for Pen-Vee K, Diflucan and Tylenol No. 3.  Patient is strongly encouraged to follow-up with the dentist especially the walk-in clinic.  ____________________________________________   FINAL CLINICAL IMPRESSION(S) / ED DIAGNOSES  Final diagnoses:  Pain due to dental caries  Dental abscess     ED Discharge Orders         Ordered    penicillin v potassium (VEETID) 500 MG  tablet  4 times daily     08/01/18 1504    fluconazole (DIFLUCAN) 150 MG tablet  Daily     08/01/18 1504    acetaminophen-codeine (TYLENOL #3) 300-30 MG tablet  Every 6 hours PRN     08/01/18 1504           Note:  This document was prepared using Dragon voice recognition software and may include unintentional dictation errors.    Tommi RumpsSummers, Rhonda L, PA-C 08/01/18 1559    Sharyn CreamerQuale, Mark, MD 08/02/18 1326

## 2018-08-01 NOTE — ED Notes (Signed)
Pr c/o left sided dental pain x2 days and reports broken teeth on that side.

## 2018-08-01 NOTE — Discharge Instructions (Signed)
Begin taking antibiotics until completely finished.  Diflucan was sent to your pharmacy in case you develop a yeast infection.  You may continue taking ibuprofen as needed for pain and inflammation.  Tylenol with codeine was sent to your pharmacy and should be taken only if you are at home or not driving as this medication could cause drowsiness.  Call the dental clinics listed on your discharge papers.  Also [facility] takes walk-in patients and that information is also provided for you.  OPTIONS FOR DENTAL FOLLOW UP CARE  [place] Department of Health and Human Services - Local Safety Net Dental Clinics [email or web address]   [facility] [phone])  [facility] [phone])  [place] [phone])  [facility] [phone])  [facility] [phone]) This clinic caters to the indigent population and is on a lottery system. Location: [employer] of Dentistry, [employer], [address], [place] Clinic Hours: Wednesdays from 6pm - 9pm, patients seen by a lottery system. For dates, call or go to [email or web address] Services: Cleanings, fillings and simple extractions. Payment Options: DENTAL WORK IS FREE OF CHARGE. Bring proof of income or support. Best way to get seen: Arrive at 5:15 pm - this is a lottery, NOT first come/first serve, so arriving earlier will not increase your chances of being seen.     [facility] [phone] Select option 1 for emergencies   Location: [facility] of Dentistry, [place], [address] [place] Clinic Hours: No walk-ins accepted - call the day before to schedule an appointment. Check in times are 9:30 am and 1:30 pm. Services: Simple extractions, temporary fillings, pulpectomy/pulp debridement, uncomplicated abscess drainage. Payment Options: PAYMENT IS  DUE AT THE TIME OF SERVICE.  Fee is usually $100-200, additional surgical procedures (e.g. abscess drainage) may be extra. Cash, checks, Visa/MasterCard accepted.  Can file Medicaid if patient is covered for dental - patient should call case worker to check. No discount for [facility] patients. Best way to get seen: MUST call the day before and get onto the schedule. Can usually be seen the next 1-2 days. No walk-ins accepted.     [facility] [phone]   Location: [facility], [address], [place] Clinic Hours: M, W, Th, F 8am or 1:30pm, Tues 9a or 1:30 - first come/first served. Services: Simple extractions, temporary fillings, uncomplicated abscess drainage.  You do not need to be an [facility] resident. Payment Options: PAYMENT IS DUE AT THE TIME OF SERVICE. Dental insurance, otherwise sliding scale - bring proof of income or support. Depending on income and treatment needed, cost is usually $50-200. Best way to get seen: Arrive early as it is first come/first served.     [facility] [phone]   Location: [address] Clinic Hours: Mon-Thu 8a-5p Services: Most basic dental services including extractions and fillings. Payment Options: PAYMENT IS DUE AT THE TIME OF SERVICE. Sliding scale, up to 50% off - bring proof if income or support. Medicaid with dental option accepted. Best way to get seen: Call to schedule an appointment, can usually be seen within 2 weeks OR they will try to see walk-ins - show up at 8a or 2p (you may have to wait).     [facility] [phone] [place] RESIDENTS ONLY   Location: [facility], [address], [place], [place] [address] Clinic Hours: By appointment only. Monday -  Thursday 8am-5pm, Friday 8am-12pm Services: Cleanings, fillings, extractions. Payment Options: PAYMENT IS DUE AT THE TIME OF  SERVICE. Cash, Visa or MasterCard. Sliding scale - $30 minimum per service. Best way to get seen: Come in to office, complete packet and make an appointment - need proof of income or support monies for each household member and proof of [facility] residence. Usually takes about a month to get in.     [facility] [phone]   Location: [address], [facility] Clinic Hours: Walk-in Urgent Care Dental Services are offered Monday-Friday mornings only. The numbers of emergencies accepted daily is limited to the number of providers available. Maximum 15 - Mondays, Wednesdays & Thursdays Maximum 10 - Tuesdays & Fridays Services: You do not need to be a [facility] resident to be seen for a dental emergency. Emergencies are defined as pain, swelling, abnormal bleeding, or dental trauma. Walkins will receive x-rays if needed. NOTE: Dental cleaning is not an emergency. Payment Options: PAYMENT IS DUE AT THE TIME OF SERVICE. Minimum co-pay is $40.00 for uninsured patients. Minimum co-pay is $3.00 for Medicaid with dental coverage. Dental Insurance is accepted and must be presented at time of visit. Medicare does not cover dental. Forms of payment: Cash, credit card, checks. Best way to get seen: If not previously registered with the clinic, walk-in dental registration begins at 7:15 am and is on a first come/first serve basis. If previously registered with the clinic, call to make an appointment.     [facility] [phone] [place] RESIDENTS ONLY   Location: [address] [place], [place] Clinic Hours: Mon-Thu 10a-2p Services: Extractions only! Payment Options: FREE (donations accepted) - bring proof of income or support Best way to get seen: Call and schedule an appointment OR come at 8am on the 1st Monday of every month (except for holidays) when it is first come/first served.     [facility] [phone]    Location: [address] [place], [place] Clinic Hours: Friday mornings Services, Payment Options, Best way to get seen: Call for info

## 2018-08-01 NOTE — ED Notes (Signed)
First nurse note  Presents with dental pain

## 2018-08-07 IMAGING — CR DG CHEST 2V
1 series · 2 of 2 positions shown · non-contrast
Comparison: Radiographs October 31, 2012.

CLINICAL DATA: Chest pain, shortness of breath.

EXAM:
CHEST  2 VIEW

[Series 1: dg chest 2 view · 0.14mm/px · 2 of 2 slices shown]
[im 1/2]
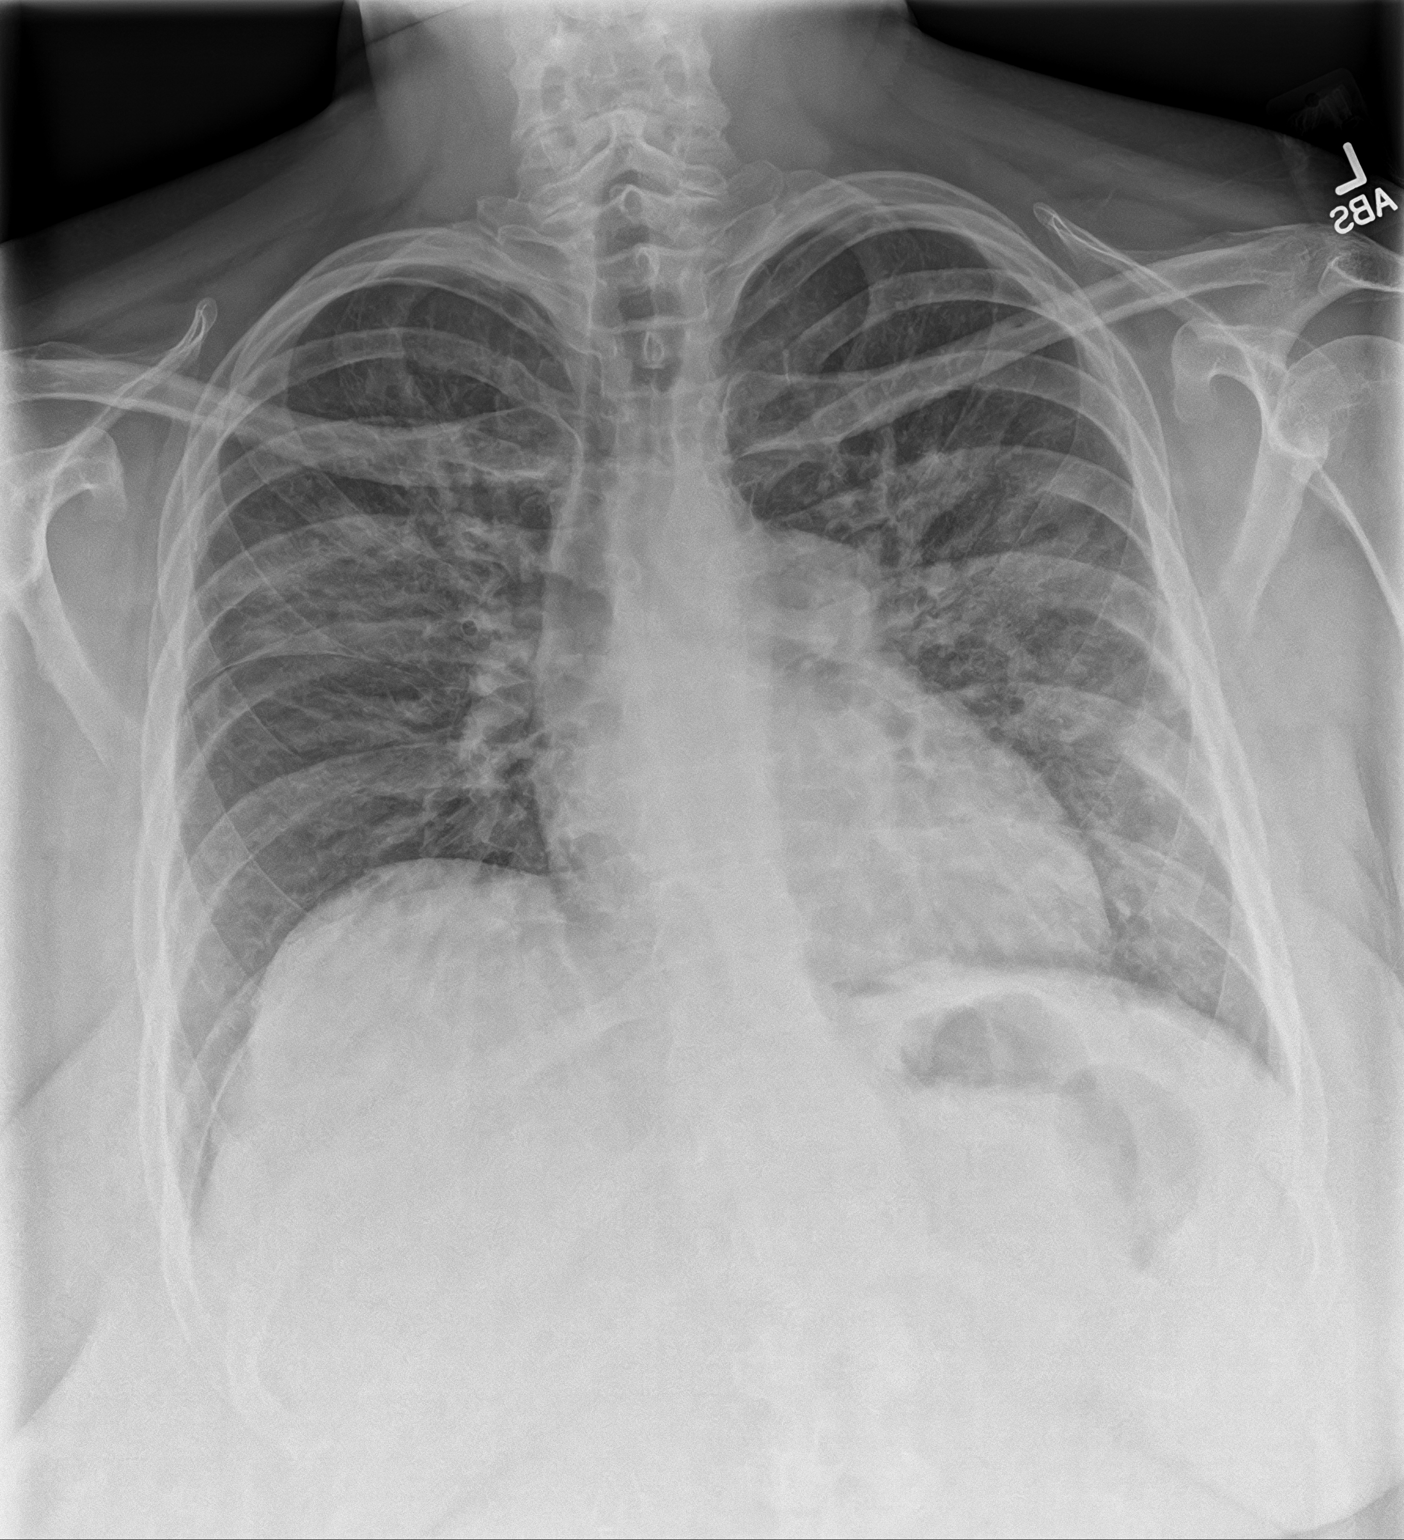
[im 2/2]
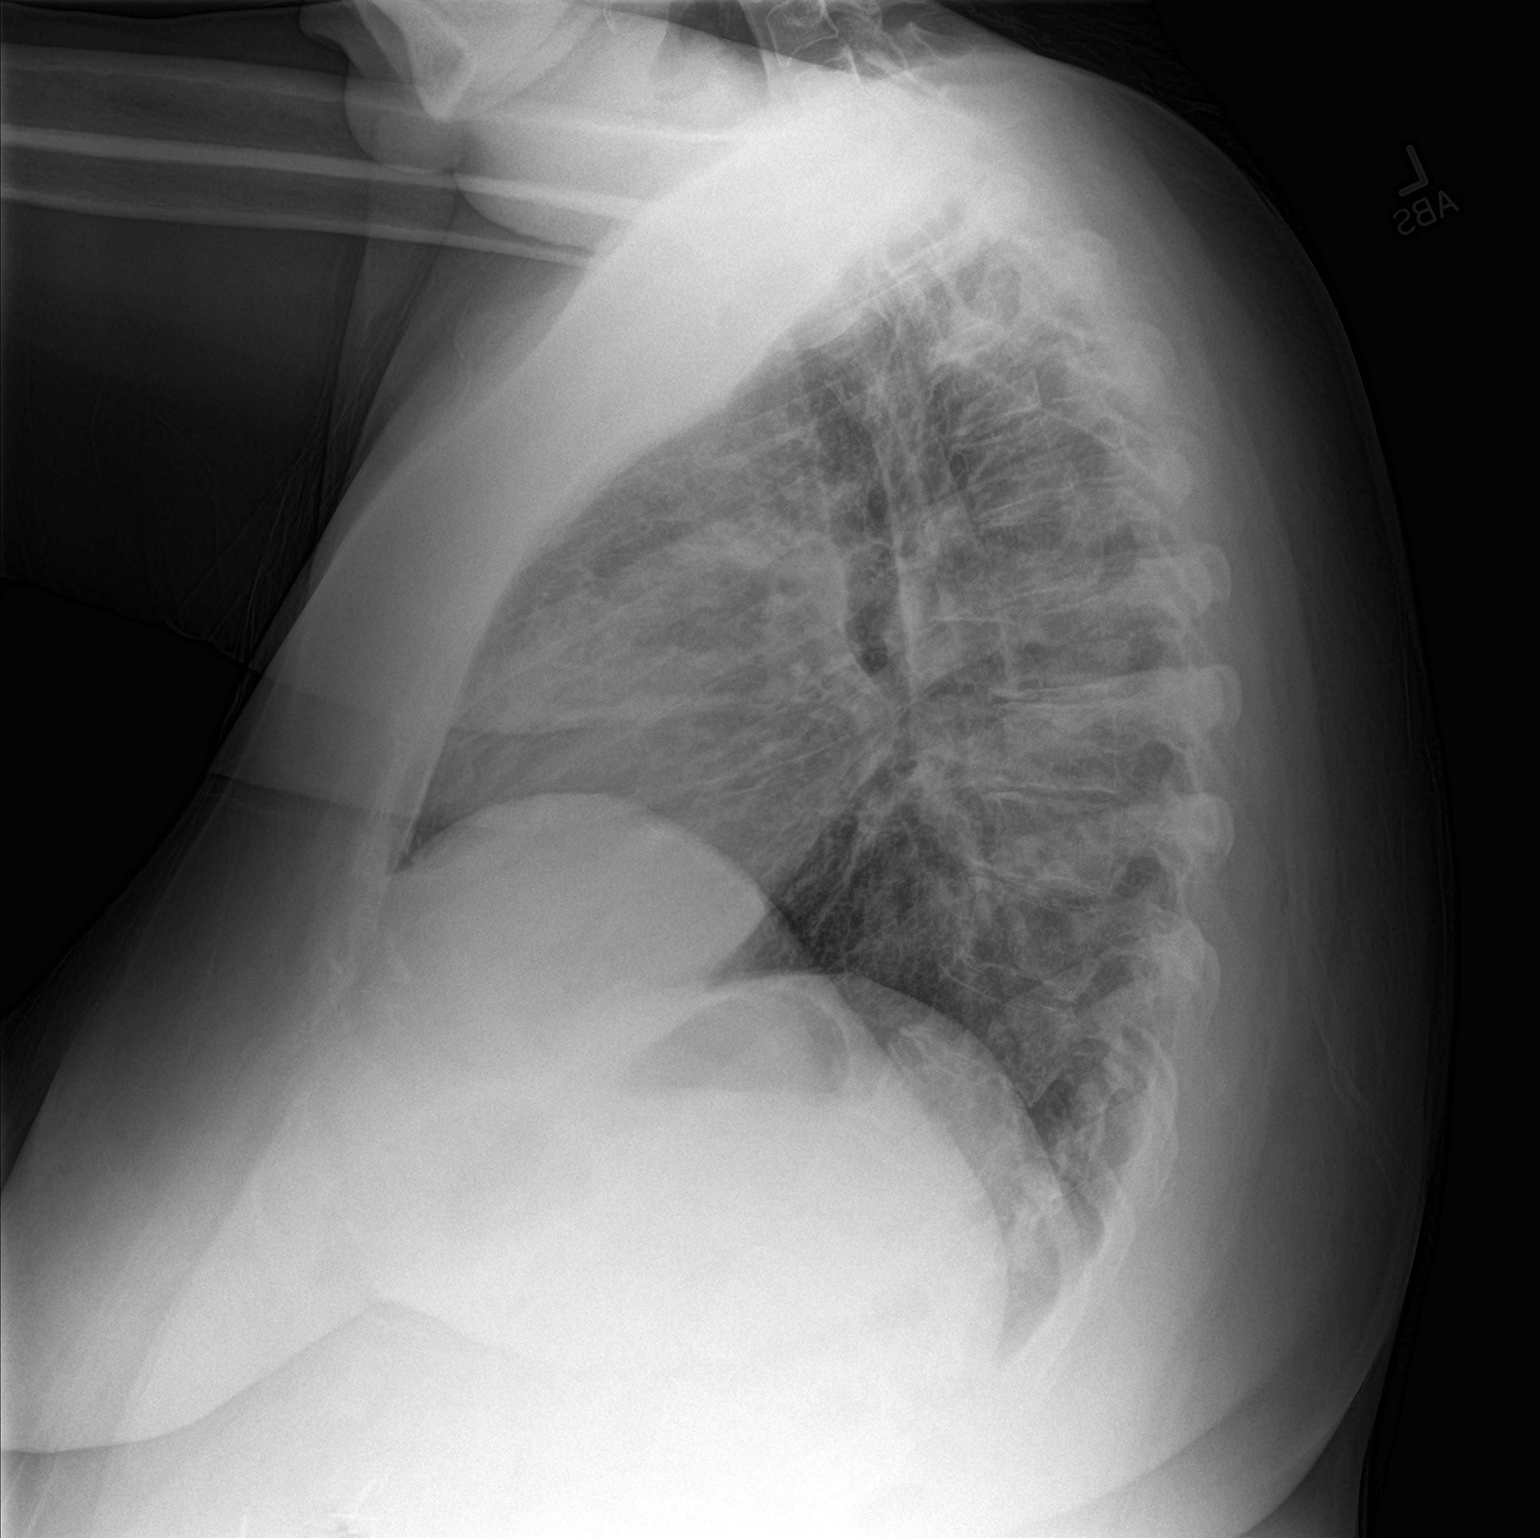

[2 of 2 positions shown; findings below may reference images not displayed]

FINDINGS: The heart size and mediastinal contours are within normal limits. No
pneumothorax or pleural effusion is noted. Bilateral ill-defined
interstitial and patchy opacities are noted in both lungs concerning
for edema or possibly multifocal pneumonia. The visualized skeletal
structures are unremarkable.
IMPRESSION: Interval development of ill-defined patchy and interstitial
opacities in both lungs concerning for edema or or possibly
multifocal pneumonia. Clinical correlation is recommended.

## 2018-08-28 ENCOUNTER — Emergency Department
Admission: EM | Admit: 2018-08-28 | Discharge: 2018-08-28 | Disposition: A | Discharge status: Home / Self Care | Payer: Medicaid Other | Attending: Emergency Medicine | Admitting: Emergency Medicine

## 2018-08-28 ENCOUNTER — Other Ambulatory Visit: Payer: Self-pay

## 2018-08-28 DIAGNOSIS — E109 Type 1 diabetes mellitus without complications: Secondary | ICD-10-CM | POA: Insufficient documentation

## 2018-08-28 DIAGNOSIS — I10 Essential (primary) hypertension: Secondary | ICD-10-CM | POA: Insufficient documentation

## 2018-08-28 DIAGNOSIS — K047 Periapical abscess without sinus: Secondary | ICD-10-CM | POA: Insufficient documentation

## 2018-08-28 DIAGNOSIS — Z79899 Other long term (current) drug therapy: Secondary | ICD-10-CM | POA: Insufficient documentation

## 2018-08-28 DIAGNOSIS — K0889 Other specified disorders of teeth and supporting structures: Secondary | ICD-10-CM

## 2018-08-28 DIAGNOSIS — F17210 Nicotine dependence, cigarettes, uncomplicated: Secondary | ICD-10-CM | POA: Insufficient documentation

## 2018-08-28 MED ORDER — PENICILLIN V POTASSIUM 500 MG PO TABS: 500.0000 mg | ORAL_TABLET | Freq: Four times a day (QID) | ORAL | 0 refills | Status: AC

## 2018-08-28 MED ORDER — KETOROLAC TROMETHAMINE 60 MG/2ML IM SOLN
60.0000 mg | Freq: Once | INTRAMUSCULAR | Status: AC
  Administered 2018-08-28: 60 mg via INTRAMUSCULAR
  Filled 2018-08-28: qty 2

## 2018-08-28 NOTE — ED Notes (Signed)
Pt presents today for dental pain. Pt states she thinks it is abscessed slight swelling noted at this time. Pt is NAD

## 2018-08-28 NOTE — ED Triage Notes (Signed)
Pt states L upper dental pain. Thinks it's an abscess. A&O, speaking in complete sentences. Some swelling noted to L face.

## 2018-08-28 NOTE — ED Provider Notes (Signed)
[facility] ____________________________________________  Time seen: Approximately 12:38 PM  I have reviewed the triage vital signs and the nursing notes.  HISTORY  Chief Complaint Dental Pain  HPI [name] is a 43 y.o. female presenting for evaluation of left dental pain worsening over the last 2 to 3 days.  States pain has been present for the last several weeks.  Was seen in the ER for the same complaint approximately 1 month ago and was treated with antibiotics for 1 week.  States the antibiotics did improve pain, but reports after finishing antibiotics the pain continued to worsen again.  Patient has been previously seen in the ER several times for dental pain.  Has not yet followed up with dentist.  Has been taken some ibuprofen intermittently without resolution.  Continues to overall eat and drink well.  Denies cough, congestion, sore throat, fevers, chest pain or shortness of breath.  Reports otherwise doing well. Denies other aggravating alleviating factors.  Denies recent injury.  States that she knows that she has several broken teeth.   Patient's last menstrual period was 07/29/2018.Denies pregnancy.    Past Medical History:  Diagnosis Date  . Diabetes mellitus without complication (HCC)   . Hypertension   . Obesity     Patient Active Problem List   Diagnosis Date Noted  . Indication for care in labor and delivery, antepartum 11/11/2016  . 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 CESAREAN SECTION    . PERIPHERAL VASCULAR CATHETERIZATION N/A 03/29/2015   Procedure: PICC Line Insertion;  Surgeon: Annice Needy, MD;  Location: [facility] INVASIVE CV LAB;  Service: Cardiovascular;  Laterality: N/A;  . 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 Order #: [id number] Class: Normal  . Order  #: [id number] Class: Normal  . Order #: [id number] Class: Historical Med  . Order #: [id number] Class: Historical Med  . Order #: [id number] Class: Historical Med  . Order #: [id number] Class: Normal  . Order #: [id number] Class: Historical Med    Allergies Patient has no known allergies.  History reviewed. No pertinent family history.  Social History Social History   Tobacco Use  . Smoking status: Current Every Day Smoker    Packs/day: 0.50    Types: Cigarettes  . Smokeless tobacco: Never Used  Substance Use Topics  . Alcohol use: No  . Drug use: No    Review of Systems Constitutional: No fever/chills. Reports continues to eat and drink foods and fluids well.  ENT: No sore throat. As above.  Cardiovascular: Denies chest pain. Respiratory: Denies shortness of breath. Gastrointestinal: No abdominal pain.  No nausea, no vomiting.  Skin: Negative for rash. ____________________________________________   PHYSICAL EXAM:  VITAL SIGNS: ED Triage Vitals  Enc Vitals Group     BP 08/28/18 1134 (!) 150/79     Pulse Rate 08/28/18 1134 76     Resp 08/28/18 1134 16     Temp 08/28/18 1134 97.6 F (36.4 C)     Temp Source 08/28/18 1134 Oral     SpO2 08/28/18 1134 96 %     Weight 08/28/18 1134 248 lb (112.5 kg)     Height 08/28/18 1134 5\' 9"  (1.753 m)     Head Circumference --      Peak Flow --  Pain Score 08/28/18 1133 10     Pain Loc --      Pain Edu? --      Excl. in GC? --     Constitutional: Alert and oriented. Well appearing and in no acute distress. Head: Atraumatic. Ears: Bilateral ears no erythema, normal TMs.  Nose: No congestion/rhinnorhea. Mouth/Throat: Mucous membranes are moist.  Oropharynx non-erythematous.  No tonsillar swelling exudate. Periodontal Exam    Widespread dental decay with multiple visible caries and several fractures. Left upper dental tenderness at fractured site 12/13 with surrounding gumline erythema and inflammation, no palpable or visible  abscess noted.  Minimal lower left-sided facial swelling noted, no erythema, edema location consistent with gumline tenderness, no induration palpated. Neck: No stridor.  Hematological/Lymphatic/Immunilogical: No cervical lymphadenopathy. Cardiovascular:   Normal rate, regular rhythm. Grossly normal heart sounds. Good peripheral circulation. Respiratory: Normal respiratory effort.  No retractions. Musculoskeletal:Steady gait.  Neurologic:  Normal speech and language. Speech is normal. No gait instability. Skin:  Skin is warm, dry and intact. No rash noted. Psychiatric: Mood and affect are normal. Speech and behavior are normal.  ____________________________________________   LABS (all labs ordered are listed, but only abnormal results are displayed)  Labs Reviewed - No data to display ____________________________________________    INITIAL IMPRESSION / ASSESSMENT AND PLAN / ED COURSE  Pertinent labs & imaging results that were available during my care of the patient were reviewed by me and considered in my medical decision making (see chart for details).   Overall well-appearing patient.  No acute distress.  Suspect left upper dental infection second to dental caries and fractures.  Will treat with 10-day course of Pen-VK.  60 mg IM Toradol given once in urgent care.  Continue over-the-counter ibuprofen, Tylenol as needed.  Frequent mouth rinsing and supportive care.  Further information given for local dental clinics, strongly recommend dental follow-up.  Patient was advised to see the dentist within 10 days. Also advised to take the antibiotic until finished. Instructed to return to the ER  for symptoms that change or worsening concerns. ____________________________________________   FINAL CLINICAL IMPRESSION(S) / ED DIAGNOSES  Final diagnoses:  Pain, dental  Dental infection         Renford Dills, NP 08/28/18 1328    Dionne Bucy, MD 08/28/18 1535

## 2018-08-28 NOTE — Discharge Instructions (Addendum)
Take medication as prescribed. Rest. Drink plenty of fluids.  Continue over-the-counter Tylenol and ibuprofen as needed.  Frequent mouth rinsing.  Follow-up with dentist as soon as possible.  Follow up with your primary care physician this week as needed. Return to Urgent care for new or worsening concerns.   OPTIONS FOR DENTAL FOLLOW UP CARE  [place] Department of Health and Human Services - Local Safety Net Dental Clinics TripDoors.com.htm   [facility] [phone])  Sharl Ma [phone])  [place] [phone])  [facility] [phone])  [facility] [phone]) This clinic caters to the indigent population and is on a lottery system. Location: [employer] [facility], [employer], [address] [place] Clinic Hours: Wednesdays from 6pm - 9pm, patients seen by a lottery system. For dates, call or go to [email or web address] Services: Cleanings, fillings and simple extractions. Payment Options: DENTAL WORK IS FREE OF CHARGE. Bring proof of income or support. Best way to get seen: Arrive at 5:15 pm - this is a lottery, NOT first come/first serve, so arriving earlier will not increase your chances of being seen.     [facility] [phone] Select option 1 for emergencies   Location: [facility], [place], [address] [place] Clinic Hours: No walk-ins accepted - call the day before to schedule an appointment. Check in times are 9:30 am and 1:30 pm. Services: Simple extractions, temporary fillings, pulpectomy/pulp debridement, uncomplicated abscess drainage. Payment Options: PAYMENT IS DUE AT THE TIME OF SERVICE.  Fee is usually $100-200, additional surgical procedures (e.g. abscess drainage) may be extra. Cash, checks, Visa/MasterCard accepted.  Can file Medicaid if  patient is covered for dental - patient should call case worker to check. No discount for [facility] patients. Best way to get seen: MUST call the day before and get onto the schedule. Can usually be seen the next 1-2 days. No walk-ins accepted.     [facility] [phone]   Location: [facility], [address] [place] Clinic Hours: M, W, Th, F 8am or 1:30pm, Tues 9a or 1:30 - first come/first served. Services: Simple extractions, temporary fillings, uncomplicated abscess drainage.  You do not need to be an [facility] resident. Payment Options: PAYMENT IS DUE AT THE TIME OF SERVICE. Dental insurance, otherwise sliding scale - bring proof of income or support. Depending on income and treatment needed, cost is usually $50-200. Best way to get seen: Arrive early as it is first come/first served.     [facility] [phone]   Location: [address] Clinic Hours: Mon-Thu 8a-5p Services: Most basic dental services including extractions and fillings. Payment Options: PAYMENT IS DUE AT THE TIME OF SERVICE. Sliding scale, up to 50% off - bring proof if income or support. Medicaid with dental option accepted. Best way to get seen: Call to schedule an appointment, can usually be seen within 2 weeks OR they will try to see walk-ins - show up at 8a or 2p (you may have to wait).     [facility] [phone] [place] RESIDENTS ONLY   Location: [facility], [address] [place], [place] [address] Clinic Hours: By appointment only. Monday - Thursday 8am-5pm, Friday 8am-12pm Services: Cleanings, fillings, extractions. Payment Options: PAYMENT IS DUE AT THE TIME OF SERVICE. Cash, Visa or MasterCard. Sliding scale - $30 minimum per service. Best way to get seen:  Come in to office, complete packet and make an appointment - need proof of income or  support monies for each household member and proof of [facility] residence. Usually takes about a month to get in.     [facility] [phone]   Location: [address] [facility] Clinic Hours: Walk-in Urgent Care Dental Services are offered Monday-Friday mornings only. The numbers of emergencies accepted daily is limited to the number of providers available. Maximum 15 - Mondays, Wednesdays & Thursdays Maximum 10 - Tuesdays & Fridays Services: You do not need to be a [facility] resident to be seen for a dental emergency. Emergencies are defined as pain, swelling, abnormal bleeding, or dental trauma. Walkins will receive x-rays if needed. NOTE: Dental cleaning is not an emergency. Payment Options: PAYMENT IS DUE AT THE TIME OF SERVICE. Minimum co-pay is $40.00 for uninsured patients. Minimum co-pay is $3.00 for Medicaid with dental coverage. Dental Insurance is accepted and must be presented at time of visit. Medicare does not cover dental. Forms of payment: Cash, credit card, checks. Best way to get seen: If not previously registered with the clinic, walk-in dental registration begins at 7:15 am and is on a first come/first serve basis. If previously registered with the clinic, call to make an appointment.     [facility] [phone] [place] RESIDENTS ONLY   Location: [address], [place], [place] Clinic Hours: Mon-Thu 10a-2p Services: Extractions only! Payment Options: FREE (donations accepted) - bring proof of income or support Best way to get seen: Call and schedule an appointment OR come at 8am on the 1st Monday of every month (except for holidays) when it is first come/first served.     [facility] [phone]   Location: [address] [place], [place] Clinic Hours: Friday mornings Services, Payment Options, Best way to get seen: Call for info

## 2018-11-11 ENCOUNTER — Other Ambulatory Visit: Payer: Self-pay

## 2018-11-11 ENCOUNTER — Encounter: Payer: Self-pay | Admitting: Emergency Medicine

## 2018-11-11 ENCOUNTER — Emergency Department
Admission: EM | Admit: 2018-11-11 | Discharge: 2018-11-11 | Disposition: A | Discharge status: Home / Self Care | Payer: Medicaid Other | Attending: Emergency Medicine | Admitting: Emergency Medicine

## 2018-11-11 DIAGNOSIS — S025XXA Fracture of tooth (traumatic), initial encounter for closed fracture: Secondary | ICD-10-CM

## 2018-11-11 DIAGNOSIS — K0889 Other specified disorders of teeth and supporting structures: Secondary | ICD-10-CM | POA: Diagnosis present

## 2018-11-11 DIAGNOSIS — Z794 Long term (current) use of insulin: Secondary | ICD-10-CM | POA: Insufficient documentation

## 2018-11-11 DIAGNOSIS — E119 Type 2 diabetes mellitus without complications: Secondary | ICD-10-CM | POA: Insufficient documentation

## 2018-11-11 DIAGNOSIS — I10 Essential (primary) hypertension: Secondary | ICD-10-CM | POA: Diagnosis not present

## 2018-11-11 DIAGNOSIS — X58XXXA Exposure to other specified factors, initial encounter: Secondary | ICD-10-CM | POA: Diagnosis not present

## 2018-11-11 DIAGNOSIS — F17210 Nicotine dependence, cigarettes, uncomplicated: Secondary | ICD-10-CM | POA: Diagnosis not present

## 2018-11-11 DIAGNOSIS — K047 Periapical abscess without sinus: Secondary | ICD-10-CM | POA: Insufficient documentation

## 2018-11-11 DIAGNOSIS — Z79899 Other long term (current) drug therapy: Secondary | ICD-10-CM | POA: Insufficient documentation

## 2018-11-11 DIAGNOSIS — Y939 Activity, unspecified: Secondary | ICD-10-CM | POA: Insufficient documentation

## 2018-11-11 DIAGNOSIS — Y929 Unspecified place or not applicable: Secondary | ICD-10-CM | POA: Insufficient documentation

## 2018-11-11 DIAGNOSIS — Y999 Unspecified external cause status: Secondary | ICD-10-CM | POA: Insufficient documentation

## 2018-11-11 MED ORDER — TRAMADOL HCL 50 MG PO TABS: 50.0000 mg | ORAL_TABLET | Freq: Four times a day (QID) | ORAL | 0 refills | Status: DC | PRN

## 2018-11-11 MED ORDER — AMOXICILLIN 500 MG PO TABS: 500.0000 mg | ORAL_TABLET | Freq: Three times a day (TID) | ORAL | 0 refills | Status: DC

## 2018-11-11 NOTE — ED Notes (Signed)
See triage note  Presents with a 3-4 day hx of right lower dental pain  States her tooth is broken into gum line

## 2018-11-11 NOTE — ED Provider Notes (Signed)
[facility] Emergency Department Provider Note ____________________________________________  Time seen: Approximately 3:28 PM  I have reviewed the triage vital signs and the nursing notes.   HISTORY  Chief Complaint Dental Pain   HPI [name] is a 43 y.o. female who presents to the emergency department for treatment and evaluation of dental pain.   Started a few days ago.  She states that her tooth is broken down into the gumline.  Symptoms started 3 days ago.  No relief with ibuprofen.  She denies fever.  Past Medical History:  Diagnosis Date  . Diabetes mellitus without complication (HCC)   . Hypertension   . Obesity     Patient Active Problem List   Diagnosis Date Noted  . Indication for care in labor and delivery, antepartum 11/11/2016  . 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 CESAREAN SECTION    . PERIPHERAL VASCULAR CATHETERIZATION N/A 03/29/2015   Procedure: PICC Line Insertion;  Surgeon: Annice Needy, MD;  Location: [facility] INVASIVE CV LAB;  Service: Cardiovascular;  Laterality: N/A;  . 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 Authorizing Provider  amoxicillin (AMOXIL) 500 MG tablet Take 1 tablet (500 mg total) by mouth 3 (three) times daily. 11/11/18   Adelbert Gaspard B, FNP  insulin aspart (NOVOLOG) 100 UNIT/ML injection Inject into the skin 3 (three) times daily before meals.    [provider]  insulin regular (NOVOLIN R,HUMULIN R) 100 units/mL injection Inject into the skin 3 (three) times daily before meals.    [provider]  metoprolol tartrate (LOPRESSOR) 25 MG tablet Take 25 mg by mouth 2 (two) times daily.    [provider]  simvastatin (ZOCOR) 40 MG tablet Take 40 mg by mouth daily.    [provider]  traMADol (ULTRAM) 50 MG tablet Take 1 tablet (50 mg total) by mouth every 6 (six) hours as needed. 11/11/18   Chinita Pester, FNP    Allergies Patient has no known allergies.  No family history on file.  Social History Social History   Tobacco Use  . Smoking status: Current Every Day Smoker    Packs/day: 0.50    Types: Cigarettes  . Smokeless tobacco: Never Used  Substance Use Topics  . Alcohol use: No  . Drug use: No    Review of Systems Constitutional: Negative for fever or recent illness. ENT: Positive for dental pain. Musculoskeletal: Negative for trismus of the jaw.  Skin: Negative for wound or lesion. ____________________________________________   PHYSICAL EXAM:  VITAL SIGNS: ED Triage Vitals  Enc Vitals Group     BP 11/11/18 1333 (!) 128/92     Pulse Rate 11/11/18 1333 65     Resp 11/11/18 1333 20     Temp 11/11/18 1333 97.9 F (36.6 C)     Temp Source 11/11/18 1333 Oral     SpO2 11/11/18 1333 96 %     Weight 11/11/18 1330 248 lb (112.5 kg)     Height 11/11/18 1330 5\' 9"  (1.753 m)     Head Circumference --      Peak Flow --      Pain Score 11/11/18 1330 6     Pain Loc --      Pain  Edu? --      Excl. in GC? --     Constitutional: Alert and oriented. Well appearing and in no acute distress. Eyes: Conjunctiva are clear without discharge or drainage. Mouth/Throat: Airway is patent. Periodontal Exam    Hematological/Lymphatic/Immunilogical: No palpable anterior cervical adenopathy on exam. Respiratory: Respirations even and unlabored. Musculoskeletal: Full ROM of the jaw. Neurologic: Awake, alert, oriented.  Skin: No facial erythema or edema. Psychiatric: Affect and behavior intact.  ____________________________________________   LABS (all labs ordered are listed, but only abnormal results are displayed)  Labs Reviewed - No data to display ____________________________________________   RADIOLOGY  Not indicated.  ____________________________________________   PROCEDURES  Procedure(s) performed:   Procedures  Critical Care performed: No ____________________________________________   INITIAL IMPRESSION / ASSESSMENT AND PLAN / ED COURSE  [name] is a 43 y.o. female presents to the emergency department for treatment and evaluation of dental pain.  Patient will be treated with amoxicillin and tramadol.  She was given a list of community resources.  She is to call and schedule an appointment with someone within the next 2 weeks.  She was encouraged to return to the emergency department for symptoms of change or worsen if she is unable to schedule an appointment with a dentist or primary care.  Pertinent labs & imaging results that were available during my care of the patient were reviewed by me and considered in my medical decision making (see chart for details).  ____________________________________________   FINAL CLINICAL IMPRESSION(S) / ED DIAGNOSES  Final diagnoses:  Closed fracture of tooth, initial encounter  Dental infection    Discharge Medication List as of 11/11/2018  2:27 PM    START taking these medications   Details  amoxicillin (AMOXIL) 500 MG tablet Take 1 tablet (500 mg total) by mouth 3 (three) times daily., Starting Thu 11/11/2018, Normal    traMADol (ULTRAM) 50 MG tablet Take 1 tablet (50 mg total) by mouth every 6 (six) hours as needed., Starting Thu 11/11/2018, Normal        If controlled substance prescribed during this visit, 12 month history viewed on the [place] prior to issuing an initial prescription for Schedule II or III opiod.  Note:  This document was prepared using Dragon voice recognition software and may include unintentional dictation errors.   Chinita Pesterriplett, Wilmore Holsomback B, FNP 11/11/18 1531    Nita SickleVeronese, [place], MD 11/12/18 [phone]

## 2018-11-11 NOTE — ED Triage Notes (Signed)
Pt reports toothache to right lower jaw for 3 days.

## 2018-11-11 NOTE — Discharge Instructions (Signed)
Please call and schedule a dental appointment as soon as possible. You will need to be seen within the next 14 days. Return to the emergency department for symptoms that change or worsen if you're unable to schedule an appointment.  OPTIONS FOR DENTAL FOLLOW UP CARE  [place] Department of Health and Human Services - Local Safety Net Dental Clinics http://www.ncdhhs.gov/dph/oralhealth/services/safetynetclinics.htm   [facility] [phone])  [facility] ([phone])  [facility] ([phone])  [facility] ([phone])  [facility] ([phone]) This clinic caters to the indigent population and is on a lottery system. Location: [facility], [place], [address], [place] Clinic Hours: Wednesdays from 6pm - 9pm, patients seen by a lottery system. For dates, call or go to [email or web address]/[facility]/[email or web address] Services: Cleanings, fillings and simple extractions. Payment Options: DENTAL WORK IS FREE OF CHARGE. Bring proof of income or support. Best way to get seen: Arrive at 5:15 pm - this is a lottery, NOT first come/first serve, so arriving earlier will not increase your chances of being seen.     [facility] [phone] Select option 1 for emergencies   Location: [facility], [place], [address], [place] Clinic Hours: No walk-ins accepted - call the day before to schedule an appointment. Check in times are 9:30 am and 1:30 pm. Services: Simple extractions, temporary fillings, pulpectomy/pulp debridement, uncomplicated abscess drainage. Payment Options: PAYMENT IS DUE AT THE TIME OF SERVICE.  Fee is usually $100-200, additional surgical procedures (e.g. abscess drainage) may be extra. Cash, checks, Visa/MasterCard accepted.  Can file Medicaid if patient is covered for dental - patient should call case worker to check. No  discount for [facility] patients. Best way to get seen: MUST call the day before and get onto the schedule. Can usually be seen the next 1-2 days. No walk-ins accepted.     [facility] [phone]   Location: [facility], [address], [place] Clinic Hours: M, W, Th, F 8am or 1:30pm, Tues 9a or 1:30 - first come/first served. Services: Simple extractions, temporary fillings, uncomplicated abscess drainage.  You do not need to be an [place] resident. Payment Options: PAYMENT IS DUE AT THE TIME OF SERVICE. Dental insurance, otherwise sliding scale - bring proof of income or support. Depending on income and treatment needed, cost is usually $50-200. Best way to get seen: Arrive early as it is first come/first served.     [facility] [phone]   Location: [address] Clinic Hours: Mon-Thu 8a-5p Services: Most basic dental services including extractions and fillings. Payment Options: PAYMENT IS DUE AT THE TIME OF SERVICE. Sliding scale, up to 50% off - bring proof if income or support. Medicaid with dental option accepted. Best way to get seen: Call to schedule an appointment, can usually be seen within 2 weeks OR they will try to see walk-ins - show up at 8a or 2p (you may have to wait).     [facility] [phone] [place] RESIDENTS ONLY   Location: [facility], [address], [place], [place] [address] Clinic Hours: By appointment only. Monday - Thursday 8am-5pm, Friday 8am-12pm Services: Cleanings, fillings, extractions. Payment Options: PAYMENT IS DUE AT THE TIME OF SERVICE. Cash, Visa or MasterCard. Sliding scale - $30 minimum per service. Best way to get seen: Come in to office, complete packet and make an appointment -   need proof of income or support monies for each household member and proof of [place]  residence. Usually takes about a month to get in.     [facility] [phone]   Location: [address] [place] Clinic Hours: Walk-in Urgent Care Dental Services are offered Monday-Friday mornings only. The numbers of emergencies accepted daily is limited to the number of providers available. Maximum 15 - Mondays, Wednesdays & Thursdays Maximum 10 - Tuesdays & Fridays Services: You do not need to be a [place] resident to be seen for a dental emergency. Emergencies are defined as pain, swelling, abnormal bleeding, or dental trauma. Walkins will receive x-rays if needed. NOTE: Dental cleaning is not an emergency. Payment Options: PAYMENT IS DUE AT THE TIME OF SERVICE. Minimum co-pay is $40.00 for uninsured patients. Minimum co-pay is $3.00 for Medicaid with dental coverage. Dental Insurance is accepted and must be presented at time of visit. Medicare does not cover dental. Forms of payment: Cash, credit card, checks. Best way to get seen: If not previously registered with the clinic, walk-in dental registration begins at 7:15 am and is on a first come/first serve basis. If previously registered with the clinic, call to make an appointment.     [facility] [phone] [place] RESIDENTS ONLY   Location: [address], [place], [place] Clinic Hours: Mon-Thu 10a-2p Services: Extractions only! Payment Options: FREE (donations accepted) - bring proof of income or support Best way to get seen: Call and schedule an appointment OR come at 8am on the 1st Monday of every month (except for holidays) when it is first come/first served.     [facility] [phone]   Location: [address], [place] Clinic Hours: Friday mornings Services, Payment Options, Best way to get seen: Call for info  

## 2019-02-13 ENCOUNTER — Emergency Department
Admission: EM | Admit: 2019-02-13 | Discharge: 2019-02-13 | Disposition: A | Discharge status: Home / Self Care | Payer: Medicaid Other | Attending: Emergency Medicine | Admitting: Emergency Medicine

## 2019-02-13 ENCOUNTER — Other Ambulatory Visit: Payer: Self-pay

## 2019-02-13 DIAGNOSIS — K047 Periapical abscess without sinus: Secondary | ICD-10-CM | POA: Insufficient documentation

## 2019-02-13 DIAGNOSIS — F17210 Nicotine dependence, cigarettes, uncomplicated: Secondary | ICD-10-CM | POA: Insufficient documentation

## 2019-02-13 DIAGNOSIS — E119 Type 2 diabetes mellitus without complications: Secondary | ICD-10-CM | POA: Insufficient documentation

## 2019-02-13 DIAGNOSIS — K0889 Other specified disorders of teeth and supporting structures: Secondary | ICD-10-CM | POA: Diagnosis present

## 2019-02-13 DIAGNOSIS — Z79899 Other long term (current) drug therapy: Secondary | ICD-10-CM | POA: Insufficient documentation

## 2019-02-13 DIAGNOSIS — E109 Type 1 diabetes mellitus without complications: Secondary | ICD-10-CM | POA: Diagnosis not present

## 2019-02-13 DIAGNOSIS — Z794 Long term (current) use of insulin: Secondary | ICD-10-CM | POA: Insufficient documentation

## 2019-02-13 MED ORDER — CLINDAMYCIN HCL 300 MG PO CAPS
300.0000 mg | ORAL_CAPSULE | Freq: Three times a day (TID) | ORAL | 0 refills | Status: AC
Start: 2019-02-13 — End: 2019-02-23

## 2019-02-13 MED ORDER — FLUCONAZOLE 150 MG PO TABS: 150.0000 mg | ORAL_TABLET | ORAL | 0 refills | Status: AC

## 2019-02-13 MED ORDER — TRAMADOL HCL 50 MG PO TABS: 50.0000 mg | ORAL_TABLET | Freq: Four times a day (QID) | ORAL | 0 refills | Status: AC | PRN

## 2019-02-13 NOTE — ED Notes (Signed)
Pt st she has not seen a dentist. She make an appointment but needs anbx today.

## 2019-02-13 NOTE — ED Provider Notes (Signed)
[facility] Emergency Department Provider Note ____________________________________________  Time seen: Approximately 2:33 PM  I have reviewed the triage vital signs and the nursing notes.   HISTORY  Chief Complaint Dental Pain   HPI [name] is a 43 y.o. female who presents to the emergency department for treatment and evaluation of dental pain.  Pain and swelling started several days ago but has gotten worse since yesterday. She denies fever.  Past Medical History:  Diagnosis Date  . Diabetes mellitus without complication (HCC)   . Hypertension   . Obesity     Patient Active Problem List   Diagnosis Date Noted  . Indication for care in labor and delivery, antepartum 11/11/2016  . 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 CESAREAN SECTION    . PERIPHERAL VASCULAR CATHETERIZATION N/A 03/29/2015   Procedure: PICC Line Insertion;  Surgeon: Annice NeedyJason S Dew, MD;  Location: [facility] INVASIVE CV LAB;  Service: Cardiovascular;  Laterality: N/A;  . 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 Authorizing Provider  clindamycin (CLEOCIN) 300 MG capsule Take 1 capsule (300 mg total) by mouth 3 (three) times daily for 10 days. 02/13/19 02/23/19  Shamara Soza, Rulon Eisenmengerari B, FNP  fluconazole (DIFLUCAN) 150 MG tablet Take 1 tablet (150 mg total) by mouth once a week for 2 doses. 02/13/19 02/21/19  Leighton Luster, Rulon Eisenmengerari B, FNP  insulin aspart (NOVOLOG) 100 UNIT/ML injection Inject into the skin 3 (three) times daily before meals.    [provider]  insulin regular (NOVOLIN R,HUMULIN R) 100 units/mL injection Inject into the skin 3 (three) times daily before meals.    [provider]  metoprolol tartrate (LOPRESSOR) 25 MG tablet Take 25 mg by mouth 2 (two) times daily.     [provider]  simvastatin (ZOCOR) 40 MG tablet Take 40 mg by mouth daily.    [provider]  traMADol (ULTRAM) 50 MG tablet Take 1 tablet (50 mg total) by mouth every 6 (six) hours as needed. 02/13/19   Chinita Pesterriplett, Keaton Stirewalt B, FNP    Allergies Patient has no known allergies.  No family history on file.  Social History Social History   Tobacco Use  . Smoking status: Current Every Day Smoker    Packs/day: 0.50    Types: Cigarettes  . Smokeless tobacco: Never Used  Substance Use Topics  . Alcohol use: No  . Drug use: No    Review of Systems Constitutional: Negative for fever or recent illness. ENT: Positive for dental pain. Musculoskeletal: Negative for trismus of the jaw.  Skin: Negative for wound or lesion. ____________________________________________   PHYSICAL EXAM:  VITAL SIGNS: ED Triage Vitals  Enc Vitals Group     BP 02/13/19 1331 (!) 156/70     Pulse Rate 02/13/19 1331 68     Resp 02/13/19 1331 18     Temp 02/13/19 1331 98.9 F (37.2 C)     Temp Source 02/13/19 1331 Oral     SpO2 02/13/19 1331 96 %     Weight 02/13/19 1332 252 lb (114.3 kg)     Height 02/13/19 1332 5\' 9"  (1.753 m)     Head Circumference --      Peak Flow --      Pain Score 02/13/19 1332 8  Pain Loc --      Pain Edu? --      Excl. in [place]? --     Constitutional: Alert and oriented. Well appearing and in no acute distress. Eyes: Conjunctiva are clear without discharge or drainage. Mouth/Throat: Positive for right lower facial swelling.  Sublingual surface is soft Periodontal Exam    Hematological/Lymphatic/Immunilogical: No anterior cervical adenopathy on exam Respiratory: Respirations even and unlabored. Musculoskeletal: Full ROM of the jaw. Neurologic: Awake, alert, oriented.  Skin: Skin overlying the right mandible is mildly edematous without any erythema.  This does correspond with the dental fractures and gingival edema. Psychiatric: Affect and behavior intact.   ____________________________________________   LABS (all labs ordered are listed, but only abnormal results are displayed)  Labs Reviewed - No data to display ____________________________________________   RADIOLOGY  Not indicated. ____________________________________________   PROCEDURES  Procedure(s) performed:   Procedures  Critical Care performed: No ____________________________________________   INITIAL IMPRESSION / ASSESSMENT AND PLAN / ED COURSE  [name] is a 43 y.o. female presenting to the emergency department for treatment and evaluation of dental pain.  Patient will be treated with clindamycin since she is a diabetic and states that the last round of amoxicillin did not work very well for her dental abscess.  She also requested Diflucan to prevent yeast infection related to antibiotic use.  This was also prescribed.  She is to call tomorrow and schedule an appointment with the dentist.  She states that she has someone in mind and now that she got her Medicaid she feels that she will be able to schedule an appointment.  Patient was encouraged to take the antibiotic until finished.  She was advised to return to the emergency department for symptoms of change or worsen if she is unable to schedule an appointment.  Pertinent labs & imaging results that were available during my care of the patient were reviewed by me and considered in my medical decision making (see chart for details).  ____________________________________________   FINAL CLINICAL IMPRESSION(S) / ED DIAGNOSES  Final diagnoses:  Dental abscess    Discharge Medication List as of 02/13/2019  2:41 PM    START taking these medications   Details  clindamycin (CLEOCIN) 300 MG capsule Take 1 capsule (300 mg total) by mouth 3 (three) times daily for 10 days., Starting Sun 02/13/2019, Until Wed 02/23/2019, Normal    fluconazole (DIFLUCAN) 150 MG tablet Take 1 tablet (150 mg total) by mouth once a  week for 2 doses., Starting Sun 02/13/2019, Until Mon 02/21/2019, Normal        If controlled substance prescribed during this visit, 12 month history viewed on the [place] prior to issuing an initial prescription for Schedule II or III opiod.  Note:  This document was prepared using Dragon voice recognition software and may include unintentional dictation errors.   Victorino Dike, FNP 02/13/19 Kris Mouton, MD 02/15/19 1023

## 2019-02-13 NOTE — Discharge Instructions (Signed)
Please call and schedule a dental appointment as soon as possible. You will need to be seen within the next 14 days. Return to the emergency department for symptoms that change or worsen if you're unable to schedule an appointment. ° °

## 2019-02-13 NOTE — ED Triage Notes (Signed)
Pt states that she has been having pain in her tooth for a few days, states that the pain and swelling started getting worse yesterday, pt reports she has had problems with this tooth in the past

## 2019-02-17 ENCOUNTER — Other Ambulatory Visit: Payer: Self-pay

## 2019-02-17 DIAGNOSIS — Z20822 Contact with and (suspected) exposure to covid-19: Secondary | ICD-10-CM

## 2019-02-18 LAB — NOVEL CORONAVIRUS, NAA: SARS-CoV-2, NAA: NOT DETECTED

## 2020-02-22 DEATH — deceased
